# Patient Record
Sex: Female | Born: 1955 | Race: White | Hispanic: No | Marital: Married | Smoking: Never smoker
Health system: Southern US, Community
[De-identification: ages and names within clinical notes are randomized; demographics above are authoritative.]

## PROBLEM LIST (undated history)

## (undated) ENCOUNTER — Emergency Department (HOSPITAL_COMMUNITY): Discharge status: Against Medical Advice | Payer: BC Managed Care – PPO

## (undated) DIAGNOSIS — E785 Hyperlipidemia, unspecified: Secondary | ICD-10-CM

## (undated) DIAGNOSIS — T783XXA Angioneurotic edema, initial encounter: Secondary | ICD-10-CM

## (undated) DIAGNOSIS — T7840XA Allergy, unspecified, initial encounter: Secondary | ICD-10-CM

## (undated) DIAGNOSIS — F32A Depression, unspecified: Secondary | ICD-10-CM

## (undated) DIAGNOSIS — B019 Varicella without complication: Secondary | ICD-10-CM

## (undated) DIAGNOSIS — Z780 Asymptomatic menopausal state: Secondary | ICD-10-CM

## (undated) DIAGNOSIS — N3281 Overactive bladder: Secondary | ICD-10-CM

## (undated) DIAGNOSIS — R32 Unspecified urinary incontinence: Secondary | ICD-10-CM

## (undated) DIAGNOSIS — H269 Unspecified cataract: Secondary | ICD-10-CM

## (undated) DIAGNOSIS — H6120 Impacted cerumen, unspecified ear: Secondary | ICD-10-CM

## (undated) DIAGNOSIS — J45909 Unspecified asthma, uncomplicated: Secondary | ICD-10-CM

## (undated) DIAGNOSIS — G4700 Insomnia, unspecified: Secondary | ICD-10-CM

## (undated) DIAGNOSIS — M8580 Other specified disorders of bone density and structure, unspecified site: Secondary | ICD-10-CM

## (undated) DIAGNOSIS — M4854XA Collapsed vertebra, not elsewhere classified, thoracic region, initial encounter for fracture: Secondary | ICD-10-CM

## (undated) DIAGNOSIS — K219 Gastro-esophageal reflux disease without esophagitis: Secondary | ICD-10-CM

## (undated) DIAGNOSIS — M26609 Unspecified temporomandibular joint disorder, unspecified side: Secondary | ICD-10-CM

## (undated) DIAGNOSIS — F329 Major depressive disorder, single episode, unspecified: Secondary | ICD-10-CM

## (undated) HISTORY — DX: Gastro-esophageal reflux disease without esophagitis: K21.9

## (undated) HISTORY — DX: Unspecified temporomandibular joint disorder, unspecified side: M26.609

## (undated) HISTORY — DX: Varicella without complication: B01.9

## (undated) HISTORY — PX: COLONOSCOPY: SHX174

## (undated) HISTORY — DX: Unspecified asthma, uncomplicated: J45.909

## (undated) HISTORY — DX: Major depressive disorder, single episode, unspecified: F32.9

## (undated) HISTORY — DX: Overactive bladder: N32.81

## (undated) HISTORY — DX: Angioneurotic edema, initial encounter: T78.3XXA

## (undated) HISTORY — DX: Asymptomatic menopausal state: Z78.0

## (undated) HISTORY — DX: Allergy, unspecified, initial encounter: T78.40XA

## (undated) HISTORY — DX: Impacted cerumen, unspecified ear: H61.20

## (undated) HISTORY — DX: Depression, unspecified: F32.A

## (undated) HISTORY — DX: Unspecified cataract: H26.9

## (undated) HISTORY — DX: Insomnia, unspecified: G47.00

## (undated) HISTORY — DX: Hyperlipidemia, unspecified: E78.5

## (undated) HISTORY — DX: Unspecified urinary incontinence: R32

## (undated) HISTORY — DX: Collapsed vertebra, not elsewhere classified, thoracic region, initial encounter for fracture: M48.54XA

## (undated) HISTORY — DX: Other specified disorders of bone density and structure, unspecified site: M85.80

---

## 1976-03-28 HISTORY — PX: TUBAL LIGATION: SHX77

## 1998-12-09 ENCOUNTER — Ambulatory Visit (HOSPITAL_COMMUNITY): Admission: RE | Admit: 1998-12-09 | Discharge: 1998-12-09 | Payer: Self-pay | Admitting: *Deleted

## 1999-03-09 ENCOUNTER — Other Ambulatory Visit: Admission: RE | Admit: 1999-03-09 | Discharge: 1999-03-09 | Payer: Self-pay | Admitting: Obstetrics and Gynecology

## 2000-06-08 ENCOUNTER — Other Ambulatory Visit: Admission: RE | Admit: 2000-06-08 | Discharge: 2000-06-08 | Payer: Self-pay | Admitting: Obstetrics and Gynecology

## 2001-07-17 ENCOUNTER — Other Ambulatory Visit: Admission: RE | Admit: 2001-07-17 | Discharge: 2001-07-17 | Payer: Self-pay | Admitting: Obstetrics and Gynecology

## 2002-08-29 ENCOUNTER — Other Ambulatory Visit: Admission: RE | Admit: 2002-08-29 | Discharge: 2002-08-29 | Payer: Self-pay | Admitting: Obstetrics and Gynecology

## 2003-01-23 ENCOUNTER — Ambulatory Visit (HOSPITAL_BASED_OUTPATIENT_CLINIC_OR_DEPARTMENT_OTHER): Admission: RE | Admit: 2003-01-23 | Discharge: 2003-01-23 | Payer: Self-pay | Admitting: Orthopedic Surgery

## 2003-02-19 ENCOUNTER — Ambulatory Visit (HOSPITAL_BASED_OUTPATIENT_CLINIC_OR_DEPARTMENT_OTHER): Admission: RE | Admit: 2003-02-19 | Discharge: 2003-02-19 | Payer: Self-pay | Admitting: Orthopedic Surgery

## 2003-12-12 ENCOUNTER — Other Ambulatory Visit: Admission: RE | Admit: 2003-12-12 | Discharge: 2003-12-12 | Payer: Self-pay | Admitting: Obstetrics and Gynecology

## 2004-01-29 ENCOUNTER — Ambulatory Visit: Payer: Self-pay | Admitting: Internal Medicine

## 2005-01-24 ENCOUNTER — Other Ambulatory Visit: Admission: RE | Admit: 2005-01-24 | Discharge: 2005-01-24 | Payer: Self-pay | Admitting: Obstetrics and Gynecology

## 2005-08-05 ENCOUNTER — Ambulatory Visit: Payer: Self-pay | Admitting: Internal Medicine

## 2005-08-18 ENCOUNTER — Ambulatory Visit: Payer: Self-pay | Admitting: Internal Medicine

## 2005-11-29 ENCOUNTER — Encounter: Admission: RE | Admit: 2005-11-29 | Discharge: 2005-11-29 | Payer: Self-pay | Admitting: Family Medicine

## 2005-12-14 ENCOUNTER — Encounter: Admission: RE | Admit: 2005-12-14 | Discharge: 2005-12-14 | Payer: Self-pay | Admitting: Sports Medicine

## 2005-12-15 ENCOUNTER — Encounter: Admission: RE | Admit: 2005-12-15 | Discharge: 2005-12-15 | Payer: Self-pay | Admitting: Sports Medicine

## 2006-01-30 ENCOUNTER — Encounter: Admission: RE | Admit: 2006-01-30 | Discharge: 2006-01-30 | Payer: Self-pay | Admitting: Family Medicine

## 2006-05-11 ENCOUNTER — Ambulatory Visit: Payer: Self-pay | Admitting: Internal Medicine

## 2006-05-12 ENCOUNTER — Ambulatory Visit: Payer: Self-pay | Admitting: Cardiology

## 2006-06-27 ENCOUNTER — Ambulatory Visit: Payer: Self-pay | Admitting: Internal Medicine

## 2006-07-28 ENCOUNTER — Ambulatory Visit: Payer: Self-pay | Admitting: Internal Medicine

## 2006-09-08 ENCOUNTER — Ambulatory Visit: Payer: Self-pay | Admitting: Internal Medicine

## 2006-09-22 ENCOUNTER — Ambulatory Visit (HOSPITAL_COMMUNITY): Admission: RE | Admit: 2006-09-22 | Discharge: 2006-09-22 | Payer: Self-pay | Admitting: Internal Medicine

## 2006-09-28 ENCOUNTER — Ambulatory Visit: Payer: Self-pay | Admitting: Internal Medicine

## 2006-10-13 ENCOUNTER — Ambulatory Visit: Payer: Self-pay | Admitting: Internal Medicine

## 2006-11-29 ENCOUNTER — Ambulatory Visit: Payer: Self-pay | Admitting: Internal Medicine

## 2007-01-09 DIAGNOSIS — K219 Gastro-esophageal reflux disease without esophagitis: Secondary | ICD-10-CM | POA: Insufficient documentation

## 2007-01-09 DIAGNOSIS — J45909 Unspecified asthma, uncomplicated: Secondary | ICD-10-CM | POA: Insufficient documentation

## 2007-01-09 DIAGNOSIS — R059 Cough, unspecified: Secondary | ICD-10-CM | POA: Insufficient documentation

## 2007-01-09 DIAGNOSIS — J310 Chronic rhinitis: Secondary | ICD-10-CM | POA: Insufficient documentation

## 2007-01-09 DIAGNOSIS — R05 Cough: Secondary | ICD-10-CM

## 2007-01-09 DIAGNOSIS — R0602 Shortness of breath: Secondary | ICD-10-CM | POA: Insufficient documentation

## 2007-01-18 ENCOUNTER — Encounter: Admission: RE | Admit: 2007-01-18 | Discharge: 2007-01-18 | Payer: Self-pay | Admitting: Neurological Surgery

## 2007-02-20 ENCOUNTER — Telehealth (INDEPENDENT_AMBULATORY_CARE_PROVIDER_SITE_OTHER): Payer: Self-pay | Admitting: *Deleted

## 2007-02-26 ENCOUNTER — Telehealth: Payer: Self-pay | Admitting: Internal Medicine

## 2007-02-26 ENCOUNTER — Ambulatory Visit: Payer: Self-pay | Admitting: Internal Medicine

## 2007-03-14 ENCOUNTER — Ambulatory Visit: Payer: Self-pay | Admitting: Internal Medicine

## 2007-03-23 ENCOUNTER — Telehealth: Payer: Self-pay | Admitting: Adult Health

## 2007-03-27 ENCOUNTER — Ambulatory Visit: Payer: Self-pay | Admitting: Internal Medicine

## 2007-04-16 ENCOUNTER — Ambulatory Visit: Payer: Self-pay | Admitting: Internal Medicine

## 2007-06-06 ENCOUNTER — Ambulatory Visit: Payer: Self-pay | Admitting: Pulmonary Disease

## 2007-06-06 DIAGNOSIS — G4710 Hypersomnia, unspecified: Secondary | ICD-10-CM

## 2007-06-11 ENCOUNTER — Encounter: Payer: Self-pay | Admitting: Pulmonary Disease

## 2007-06-11 ENCOUNTER — Ambulatory Visit (HOSPITAL_BASED_OUTPATIENT_CLINIC_OR_DEPARTMENT_OTHER): Admission: RE | Admit: 2007-06-11 | Discharge: 2007-06-11 | Payer: Self-pay | Admitting: Pulmonary Disease

## 2007-06-12 ENCOUNTER — Ambulatory Visit: Payer: Self-pay | Admitting: Internal Medicine

## 2007-06-23 DIAGNOSIS — G4700 Insomnia, unspecified: Secondary | ICD-10-CM | POA: Insufficient documentation

## 2007-06-26 ENCOUNTER — Ambulatory Visit: Payer: Self-pay | Admitting: Pulmonary Disease

## 2007-07-04 ENCOUNTER — Ambulatory Visit: Payer: Self-pay | Admitting: Pulmonary Disease

## 2007-07-07 ENCOUNTER — Emergency Department (HOSPITAL_COMMUNITY): Admission: EM | Admit: 2007-07-07 | Discharge: 2007-07-07 | Payer: Self-pay | Admitting: Emergency Medicine

## 2007-07-18 ENCOUNTER — Telehealth (INDEPENDENT_AMBULATORY_CARE_PROVIDER_SITE_OTHER): Payer: Self-pay | Admitting: *Deleted

## 2007-08-27 ENCOUNTER — Telehealth (INDEPENDENT_AMBULATORY_CARE_PROVIDER_SITE_OTHER): Payer: Self-pay | Admitting: *Deleted

## 2007-09-12 ENCOUNTER — Ambulatory Visit: Payer: Self-pay | Admitting: Internal Medicine

## 2008-11-20 ENCOUNTER — Ambulatory Visit: Payer: Self-pay | Admitting: Internal Medicine

## 2009-02-03 ENCOUNTER — Telehealth: Payer: Self-pay | Admitting: Internal Medicine

## 2009-03-28 HISTORY — PX: BLADDER SUSPENSION: SHX72

## 2010-03-23 ENCOUNTER — Ambulatory Visit
Admission: RE | Admit: 2010-03-23 | Discharge: 2010-03-23 | Payer: Self-pay | Source: Home / Self Care | Attending: Urology | Admitting: Urology

## 2010-06-07 LAB — CBC
MCHC: 33 g/dL (ref 30.0–36.0)
RBC: 3.93 MIL/uL (ref 3.87–5.11)
RDW: 13.2 % (ref 11.5–15.5)

## 2010-06-07 LAB — PROTIME-INR
INR: 1.11 (ref 0.00–1.49)
Prothrombin Time: 14.5 seconds (ref 11.6–15.2)

## 2010-06-07 LAB — TYPE AND SCREEN
ABO/RH(D): A POS
Antibody Screen: NEGATIVE

## 2010-08-10 NOTE — Procedures (Signed)
**Note Robin-Identified via Obfuscation** [name], Robin Banks               ACCOUNT NO.:  [id number]   MEDICAL RECORD NO.:  [id number]          PATIENT TYPE:  OUT   LOCATION:  SLEEP CENTER                 FACILITY:  [facility]   PHYSICIAN:  Barbaraann Share, MD,FCCPDATE OF BIRTH:  11/01/1953   DATE OF STUDY:  06/11/2007                            NOCTURNAL POLYSOMNOGRAM   REFERRING PHYSICIAN:  Barbaraann Share, MD,FCCP   INDICATION FOR STUDY:  Unspecified sleep disturbance.  780.50.   EPWORTH SLEEPINESS SCORE:  15   MEDICATIONS:   SLEEP ARCHITECTURE:  The patient had a total sleep time of 359 minutes  with adequate slow wave sleep but significantly decreased REM.  Sleep  onset latency was normal and REM onset was quite prolonged at 196  minutes.  Sleep efficiency was fairly good at 92%.   RESPIRATORY DATA:  The patient was found to have four central apnea's  and three obstructive hypopnea's for an apnea/hypopnea index of 1.2  events per hour.  The events occurred primarily in the supine position  and there was mild snoring at best noted throughout.   OXYGEN DATA:  There was oxygen desaturation transiently to 91%.   CARDIAC DATA:  No clinically significant arrhythmia's were noted.   MOVEMENT-PARASOMNIA:  The patient was found to have 126 periodic leg  movements of sleep with 2 per hour resulting in arousal or awakening.  She also had large numbers of leg jerks that did not meet the criteria  for PLMS but appeared to be associated with spontaneous arousal.  There  were no abnormal behaviors noted.   IMPRESSIONS-RECOMMENDATIONS:  1. Small numbers of apnea's and hypopnea's which do not meet the      apnea/hypopnea index criteria for the obstructive or central sleep      apnea syndrome.  2. Large numbers of periodic leg movements with mild sleep disruption      noted.  There were also large numbers of      isolated leg jerks that appear to result in arousals.  Clinical      correlation is suggested to consider the  possibility of a primary      movement disorder of sleep.      Barbaraann Share, MD,FCCP  Diplomate, American Board of Sleep  Medicine  Electronically Signed     [facility]/MEDQ  D:  06/26/2007 09:02:48  T:  06/26/2007 09:16:09  Job:  [id number]

## 2010-08-10 NOTE — Assessment & Plan Note (Signed)
[facility]                             PULMONARY OFFICE NOTE   Robin Banks, Robin                      MRN:          [id number]  DATE:02/26/2007                            DOB:          1955-11-12    HISTORY:  This is a 55 year old white female, never smoker, who presents  for followup evaluation of chronic cough.  She tells me that she had  complete control of the cough for several months ending in the first of  November while she was being treated aggressively for reflux with a  combination of Protonix q.a.m., Zegerid nightly and Reglan 10 mg before  meals and at bedtime.  She did not disclose that she started fish oil  (she thinks in the Spring) and does carry a diagnosis of positive  methacholine challenge test documented in June which contributed to  decreased airflow but no cough.   My last notes suggested that if we treated reflux and rhinitis  aggressively and the symptoms went away and the symptoms did not recur,  then we could begin to taper her medicines.  However, she did not return  for followup (she admits she has been under quite a bit of stress  emotionally and is working through this with Dr. Roselie Skinner help).   She comes back now because her cough has recurred beginning around the  first of November and it consists of a nighttime cough with minimal  sputum production that is disturbing her sleep.  She is also having  morning paroxysms of the cough that seems better during the day with no  overt reflux symptoms but mild nasal drainage.   Chart review indicates the patient was to maintain on nasal steroids but  failed to do so despite having written instructions to the contrary that  were reviewed in writing (does not remember what she did with the  instructions after she left here).   PHYSICAL EXAMINATION:  She is a pleasant, ambulatory white female in no  acute distress.  She is afebrile, normal vital signs.  HEENT:   Unremarkable, oropharynx clear.  There is no evidence of  postnasal drainage or cobblestoning.  NECK:  Supple without cervical adenopathy or tenderness, trachea is  midline, no thyromegaly.  LUNGS:  Fields perfectly clear bilaterally to auscultation and  percussion.  Regular rhythm without murmur, gallop, rub.  ABDOMEN:  Soft, benign.  EXTREMITIES:  Warm without calf tenderness, cyanosis, clubbing, edema.   IMPRESSION:  1. Recurrent cough of undetermined etiology.  This patient has      documented positive methacholine challenge test, but did so well      with treatment directed at reflux that I suspect reflux is playing      a major role in her airways instability.  2. Complex medical regimen.  I did not realize the patient was still      on progesterone (which can cause reflux) and fish oil (which can      cause non-acid injury to the upper and lower airway).  I am going      to ask her to stop  both and continue to treat her aggressively for      reflux while treating her acutely for sinus drainage by putting      her back on nasal steroids in the form of Nasacort and a 6-day      course of prednisone to eliminate any residual upper or lower      inflammation that may be present.   It remains to be seen whether aggressive treatment directed at rhinitis  and reflux in the absence of any medications that contributed to reflux  will completely eliminate the cough or whether she needs to be back on  inhaled steroids in the form of Qvar longterm since she does have a  positive methacholine challenge test (albeit with no symptoms  attributable to asthma during the test or subsequently after stopping  Qvar which occurred back in early September).  Followup will be in 2  weeks for medication reconciliation purposes.  I will see her subsequent  to that, sooner if she is still coughing, or later to help her taper her  off of some of her maintenance regimen depending on whether or not she   is able to successfully stop progestational agents which have been  linked to reflux, and whether or not that is any help.     Charlaine Dalton. Sherene Sires, MD, [facility]  Electronically Signed    MBW/MedQ  DD: 02/26/2007  DT: 02/26/2007  Job #: [id number]   cc:   Donia Guiles, M.D.

## 2010-08-10 NOTE — Assessment & Plan Note (Signed)
[facility]                             PULMONARY OFFICE NOTE   CAELEY, DOHRMANN                      MRN:          [id number]  DATE:09/08/2006                            DOB:          19-Mar-1956    HISTORY:  A 55 year old white female who initiated Singulair in April  for possible exercise-induced asthma.  Now presenting with increasing  symptoms of cough and dyspnea and need for albuterol.  She was given a  short course of prednisone, and stated even on this she only got 50%  improvement in terms of her symptoms.  She has more day than nighttime  symptoms, but does get relief immediately with albuterol.  She denies  any purulent sputum, sinus complaints, overt reflux, fevers, chills,  sweats, orthopnea, PND, or leg swelling.   For medication inventory, please see patient sheet column dated September 08, 2006, noting this patient did not follow the instructions previously  regarding the use of PPI, because she says I don't have reflux.   Presently, she is coughing so hard she notices frequent urinary  incontinence.   PHYSICAL EXAMINATION:  She is a somber, but not overtly depressed,  ambulatory white female in no acute distress.  She is afebrile with normal vital signs.  HEENT:  Unremarkable.  Oropharynx clear.  Lung fields are perfectly clear bilaterally to auscultation and  percussion with no cough elicited on inspiratory/expiratory maneuvers.  There is a regular rate and rhythm without murmurs, gallops, or rubs.  ABDOMEN:  Soft, benign.  EXTREMITIES:  Warm without calf tenderness, cyanosis, clubbing, or  edema.   PFTs were reviewed from 2000 and are normal.   IMPRESSION:  Chronic cough and dyspnea of undetermined etiology, but  only improved 50% on prednisone.  It may well be she has a combination  of asthma and reflux, and which came first is difficult to tell.  The  fact that she coughs so hard she has urinary incontinence suggests to  me  she probably also has gastroesophageal valve insufficiency caused by  wide swings in intra-abd pressure, and I tried to explain this in as  much detail as possible.   Rather than empirically treat her again on a broad spectrum pattern, I  am going to maximally treat her for acid reflux, instead, with Prilosec  20 mg b.i.d. before meals, Zantac 75 mg tablets 2 nightly, and continue  Singulair for now (reasoning that it certainly could not hurt, but is  not aggravating the upper airway like an inhaled steroid might), and  have her come back in 2 weeks for a methacholine challenge test.   I note parenthetically that she is on 400 mg of progesterone at bedtime,  and that this, just like pregnancy, tends to worsen reflux.  She says  that that is not likely the culprit, though, because  she started the symptoms long before she started the progesterone.  However, if the cough becomes truly refractory, adjust this medication  may become necessary.     Charlaine Dalton. Sherene Sires, MD, [facility]  Electronically Signed    MBW/MedQ  DD:  09/08/2006  DT: 09/09/2006  Job #: [id number]   cc:   Donia Guiles, M.D.

## 2010-08-10 NOTE — Assessment & Plan Note (Signed)
[facility]                             PULMONARY OFFICE NOTE   KHIANA, CAMINO                      MRN:          [id number]  DATE:09/28/2006                            DOB:          [birth year]    PULMONARY EXTENDED SUMMARY FOLLOW-UP VISIT:   HISTORY:  A 55 year old white female, never smoker, with a recurrent  cough, who previously was felt to have possible exercise-induced asthma  and chronic rhinitis.  This particular cough began in January with a  cold and despite transient improvement from treatment directed at  asthma, rhinitis, and reflux, she has never been 100% back to baseline.  In fact, she has only been improved about 50% of her baseline and  therefore underwent a methacholine challenge test while taking both  Singulair and both Reglan as well as Zegerid.  This reproduced her  dyspnea but not her cough on the study done September 22, 2006.  She  subsequently stopped all of her medications and comes back today no  worse in terms of cough but having increasing throat pain throat  burning, and chest discomfort.  She denies any pleuritic or exertional  component of the pain, orthopnea, PND, or overt sinus symptoms.   Presently, she complains of coughing 24 hours a day but much worse  after eating supper.  The total amount of mucus she coughs up during a  real fit is less than a half teaspoon and is not discolored.   PHYSICAL EXAMINATION:  GENERAL:  On physical examination, she is an  anxious white female who appears a bit depressed and angry at times.  VITAL SIGNS:  She is afebrile with normal vital signs.  HEENT:  Remarkably unremarkable.  Oropharynx is clear, and yet she  clears her throat frequently in her exam.  Nasal turbinates are normal.  Ear canals are clear bilaterally.  No cough or reflux elicited.  NECK:  Supple without cervical adenopathy or tenderness.  HEART:  There is a regular rate and rhythm without murmur, rub or gallop  present.  ABDOMEN:  Soft, benign.  EXTREMITIES:  Warm without calf tenderness, clubbing, cyanosis or edema.   The methacholine challenge test was reviewed with the patient from June  27 and is indeed positive at the highest strength but again, did not  elicit cough.   IMPRESSION:  1. Positive methacholine challenge test to indicate the patient has      asthma, which we suspected right along, but did not respond to      either Singulair or treatment directed at reflux.  I therefore      assumed she has primary air disease and recommended QVAR 40 two      puffs b.i.d.  2. Her throat and chest-burning strongly suggests reflux that is      induced by the use of progesterational agents for the purpose of      insomnia.  I have advised her that there are other ways to treat      insomnia, at which time she became a bit angry and upset, regarding  the possibility that she may have to stop the progestational      agents, but we worked through this.  For now, I recommended      reinstituting aggressive reflux treatment with a combination of      Protonix and Zegerid plus Reglan and gave her written instructions      on this, plus diet.   If she does have breakthrough wheezing or symptoms of dyspnea, she can  use ProAir 2 puffs q.4h. p.r.n.  I spent extra time coaching her on  optimal MDI technique, which she mastered about 75% effectiveness.   The next step, if she remains refractory in terms of her symptoms of  cough, would be to add a first-generation antihistamine to her regimen.   Since prednisone worked effectively in the past, albeit only at a 50%  level, I also recommended a six day course of prednisone to gain acute  control of her airway inflammation and let the QVAR take effect over the  next week.  1. Followup will be every two weeks to assess response to therapy      using the 14-step algorithm designed by Dr. Tomasita Morrow at the      [facility], which I  reviewed with the patient as      well.   At the patient's request, no copies were sent to any of her physicians.     Charlaine Dalton. Sherene Sires, MD, [facility]  Electronically Signed    MBW/MedQ  DD: 09/28/2006  DT: 09/29/2006  Job #: [id number]

## 2010-08-10 NOTE — Assessment & Plan Note (Signed)
[facility]                             PULMONARY OFFICE NOTE   TAQUITA, DEMBY                      MRN:          [id number]  DATE:10/13/2006                            DOB:          12/10/1955    HISTORY:  A 55 year old white female with cough and dyspnea dating back  over a year now, 100% improved following a short course of prednisone  and maintenance therapy with a combination of Qvar 2 puffs b.i.d.,  Protonix 40 mg q.a.m., Zegerid 40 mg nightly, and Reglan 10 mg before  meals and at bedtime.  On this regimen she is exercising full time  without difficulty and having no problem with cough or dyspnea.   PHYSICAL EXAMINATION:  She is a pleasant, ambulatory white female in no  acute distress with a much more positive outlook than on previous  evaluations.  She is afebrile, normal vital signs.  HEENT:  Remarkably unremarkable, oropharynx clear.  There is no evidence  of postnasal drainage or cobblestoning.  NECK:  Supple without cervical adenopathy, tenderness, trachea is  midline, no thyromegaly.  LUNGS:  Fields perfectly clear bilaterally to auscultation and  percussion.  Regular rhythm without murmur, gallop, rub.  ABDOMEN:  Soft, benign.  EXTREMITIES:  Warm without calf tenderness, cyanosis, clubbing, edema.   Hemoglobin saturation 96% on room air.   IMPRESSION:  Complete resolution of cough and atypical asthma symptoms  on a regimen directed at both airways inflammation and reflux.  I am  concerned about reflux because the patient is on progestational agents  but would at this point recommend she stay on the same regimen for 6  weeks to have full confidence that reflux and primary airway disease  both coexist in this patient and at that point, 6 weeks from now, try to  taper off the reflux medicines to see if the cough exacerbates.  If so I  am going to ask her to see Dr. Lina Sar, her gastroenterology  physician of record.   Obviously if the cough exacerbates despite  aggressive treatment directed at both asthma and reflux in the meantime  I will need to see her back here to regroup.     Charlaine Dalton. Sherene Sires, MD, [facility]  Electronically Signed    MBW/MedQ  DD: 10/13/2006  DT: 10/14/2006  Job #: [id number]   cc:   Donia Guiles, M.D.

## 2010-08-10 NOTE — Assessment & Plan Note (Signed)
[facility]                             PULMONARY OFFICE NOTE   TOPANGA, ALVELO                      MRN:          [id number]  DATE:11/29/2006                            DOB:          04/24/1955    PULMONARY EXTENDED FOLLOWUP OFFICE VISIT   HISTORY:  A 55 year old white female with cough and dyspnea dating back  to April of 2007 returns all smiles with no significant cough despite  having recent increase in nasal congestion like a head cold that seems  to be doing better without the need for any increase in medications  other than over-the-counter cold medicines.  Specifically, she has not  had increasing cough and dyspnea.  On her own, she reduced Qvar down to  2 puffs q. a.m. because she is not convinced she is asthmatic.  (Note  that she had previously had a positive methacholine challenge test on  September 22, 2006, although this did not precipitate coughing.)   For full inventory of medications, please see face sheet dated November 29, 2006 noting she is being treated for both the acid and non-acid  components of reflux presently.   PHYSICAL EXAMINATION:  She is a quite healthy-appearing ambulatory white  female in no acute distress.  VITAL SIGNS:  Stable vital signs.  HEENT:  Reveals moderate turbinate edema with no significant sinuses,  pallor, or polyps.  Oropharynx is clear.  Dentition intact.  NECK:  Supple without cervical adenopathy or tenderness. Trachea is  midline.  No thyromegaly.  LUNGS:  The lung fields are perfectly clear bilaterally to auscultation  and percussion.  No cough on inspiratory or expiratory maneuvers.  CARDIAC:  Regular rate and rhythm without murmur, gallop, or rub.  ABDOMEN:  Soft and benign.  EXTREMITIES:  Warm without calf tenderness, cyanosis, clubbing, or  edema.   IMPRESSION:  1. This patient has asthma, although it could well be that if we treat      rhinitis and reflux aggressively, she would not  need anything for      asthma.  She is not inclined to continue Qvar indefinitely based on      my conversation with her today having already tapered to 2 puffs q.      a.m., so I am fine with her stopping it now to see if she flares.  2. She should continue high dose reflux treatment for now and see if      the cough exacerbates off of Qvar before tapering the reflux      medicine.  If the cough then exacerbates off reflux medicines, I      think she should see Dr. Lina Sar, her GI physician of record.  3. She does have evidence clinically of rhinitis, but no evidence of      sinusitis by most recent sinus CT scan dated May 12, 2006.  I,      therefore, spent extra time going over a chronic rhinitis flyer      with her, suggesting the use of over-the-counter fluticasone for      the purpose of  controlling nasal inflammation and having failed      Singulair previously.  She received written formatted material,      which I have asked her to review, explaining how to treat rhinitis      both chronically and acutely using Afrin for 5 days to help      fluticasone to reach the target tissue.   Followup will be in 6 weeks, sooner if needed.     Charlaine Dalton. Sherene Sires, MD, [facility]  Electronically Signed    MBW/MedQ  DD: 11/29/2006  DT: 11/29/2006  Job #: [id number]   cc:   Donia Guiles, M.D.

## 2010-08-13 NOTE — Assessment & Plan Note (Signed)
[facility]                             PULMONARY OFFICE NOTE   ELINDA, BUNTEN                      MRN:          [id number]  DATE:07/28/2006                            DOB:          02/01/1956    HISTORY:  A 55 year old, white female started on Singulair on April 1  for possible exercise-induced asthma who found marked improvement in  need for albuterol until she caught a cold several weeks ago and now  comes in with a hacking cough productive of green yellow sputum  associated with dyspnea and increased need for albuterol over baseline  to the point where she is using it about twice a day. In fact she last  used it within the last 4 hours before coming to the office. She denies  any pleuritic pain, fever, sinus or overt reflux symptoms.   PHYSICAL EXAMINATION:  GENERAL:  She is a pleasant, ambulatory, white  female in no acute distress.  VITAL SIGNS:  She had stable vital signs.  HEENT:  Unremarkable. Oropharynx reveals minimal erythema. Nasal  turbinates reveal moderate edema. Ear canals clear bilaterally.  NECK:  Supple without cervical adenopathy or tenderness. The trachea is  midline, no thyromegaly.  LUNGS:  Lung fields completely clear bilaterally to auscultation and  percussion.  HEART:  There is a regular rate and rhythm without murmur, gallop or  rub.  ABDOMEN:  Soft and benign.  EXTREMITIES:  Warm without calf tenderness, cyanosis, clubbing or edema.   Chart review indicates she had a sinus CT scan on May 12, 2006 that  was normal.   IMPRESSION:  Question acute asthma in the setting of tracheobronchitis  with purulent sputum. She may even have an underlying sinusitis.  However, at this point the objective evidence of the disease is  disproportionate to her symptoms, so I am going to recommend  conservative treatment with doxicycline 100 mg b.i.d. for 7 days,  prednisone 10 mg tablets #14 to be tapered over 6 days, and ask  her to  start back on Prilosec, since she previously responded so well in terms  of some of her atypical symptoms to PPI therapy. In fact, I told her at  the onset of  cough for any reason she should start on Prilosec twice  daily until better and then taper it back off.   At this point, it is too soon to call Singulair a failure but I reminded  her about the rule of twos and that she should use the Singulair on a  daily basis and if she is having frequent exacerbations of any cause  then we need to switch over to inhaled steroids but perhaps first would  benefit from a methacholine challenge test to make absolutely sure that  this is true asthma (the improvement on Singulair is certainly  suggestive as is her response to Albuterol).    Charlaine Dalton. Sherene Sires, MD, [facility]  Electronically Signed   MBW/MedQ  DD: 07/29/2006  DT: 07/29/2006  Job #: [id number]   cc:   Donia Guiles, M.D.

## 2010-08-13 NOTE — Op Note (Signed)
NAME:  Robin Banks, Robin Banks                         ACCOUNT NO.:  [id number]   MEDICAL RECORD NO.:  [id number]                   PATIENT TYPE:  AMB   LOCATION:  DSC                                  FACILITY:  [facility]   PHYSICIAN:  Cindee Salt, M.D.                    DATE OF BIRTH:  October 15, 1955   DATE OF PROCEDURE:  02/19/2003  DATE OF DISCHARGE:                                 OPERATIVE REPORT   PREOPERATIVE DIAGNOSIS:  Carpal tunnel syndrome, left hand.   POSTOPERATIVE DIAGNOSIS:  Carpal tunnel syndrome, left hand.   OPERATION:  Decompression of left median nerve.   SURGEON:  Cindee Salt, M.D.   ASSISTANTCarolyne Fiscal.   ANESTHESIA:  Forearm-based IV regional.   HISTORY:  The patient is a 55 year old female with a history of carpal  tunnel syndrome; EMG nerve conduction is positive.  This has not responded  to conservative treatment.   DESCRIPTION OF PROCEDURE:  The patient is brought to the operating room,  where a forearm-based IV regional anesthetic was carried out without  difficulty.  She was prepped using Duraprep in the supine position, with the  left arm free.  A longitudinal incision was made at the palm and carried  down through subcutaneous tissue.  Bleeders were electrocauterized.  Palmar  fascia was split.  Superficial palmar arch identified.  The flexor tendon to  the ring and middle finger identified to the ulnar side of the median nerve.  Carpal retinaculum was incised with sharp dissection, right angle and Sewell  retractor placed between skin and forearm fascia.  The fascia was released  for approximately 3 cm, proximal to the wrist crease under direct vision.  The canal was explored.  No further lesions were identified.  The wound was  irrigated.  The skin was closed with interrupted 5-0 nylon sutures.  A  sterile compressive dressing and splint was applied.  The patient tolerated  the procedure well and was taken to the recovery room for observation, in  satisfactory  condition.   She is discharged to home, to return to [facility] [place] in one  week.  Given Talwin, Tylox and Keflex.                                               Cindee Salt, M.D.    GK/MEDQ  D:  02/19/2003  T:  02/20/2003  Job:  [id number]

## 2010-08-13 NOTE — Assessment & Plan Note (Signed)
[facility]                             PULMONARY OFFICE NOTE   MORGANNE, Robin Banks                      MRN:          [id number]  DATE:05/11/2006                            DOB:          09/03/1955    REASON FOR CONSULTATION:  Cough/wheeze.   HISTORY:  A 55 year old white female, never smoker who was diagnosed  with allergies at age 57 consisting of headache and sinus drainage for  which she underwent chemical cautery and was then maintained on Allegra  for a while. She found out on her own that on or off the Allegra made  no difference and that the symptoms of mild nasal congestion were not  worth taking the medicine for. However, since that she was seen by Dr.  Marcos Eke with diagnosis of asthma versus GERD in 2001 and again placed  on medications that seemed to help but when she stopped them it seemed  to not make any difference. This consisted of Pulmicort, Serevent, and  medicine directed a reflux. She comes in now with new onset dyspnea on  exertion that started in April of 2007, seemed better initially with  albuterol but now since early January it has been having a hacking cough  during the day, productive of only minum sputum, no nocturnal symptoms  or dyspnea with exertion but is better on albuterol but only for a short  period of time and then relapses. She denies any exertional chest pain,  orthopnea, PND, overt sinus or reflux symptoms.   PAST MEDICAL HISTORY:  Significant only for tubal ligation and a  diagnosis of acid reflux disease. Diverticulosis by colonoscopy Aug 18, 2005. Under reflux disease I could not find any studies at [facility] on the issue of reflux.   ALLERGIES:  None known.   SOCIAL HISTORY:  She has never smoked. She works as a Forensic scientist.   MEDICATIONS:  Include; multivitamins, testosterone, progesterone, and  estrogens.   FAMILY HISTORY:  Recorded in detail, negative for respiratory  diseases  or atopy.   REVIEW OF SYSTEMS:  Also taken in detail and negative except as outlined  in the present illness.   PHYSICAL EXAMINATION:  This is a pleasant ambulatory white female in no  acute distress, she is afebrile, stable vital signs.  HEENT: Reveals moderate nonspecific turbinate edema, oropharynx  is  clear with no excessive postnasal drainage or cobblestoning, dentition  is intact, ear canals clear bilaterally.  NECK: Supple without cervical adenopathy, or tenderness. Trachea is  midline.  LUNG FIELDS: Perfectly clear bilaterally to auscultation and percussion  with no cough listened on inspiratory or expiratory maneuvers.  There is a regular rate and rhythm, without murmur, gallop, or rub.  ABDOMEN: Soft, benign.  EXTREMITIES: Without calf tenderness, cyanosis, clubbing, or edema.   Chest x-ray is reviewed from January 30, 2006 is normal.   IMPRESSION:  I suspect that this patient has chronic rhinitis with very  mild asthma. The challenge is figuring which came first. In the past she  seemed to respond to medicines that were combined together and which  left unanswered the question of what started the problem and then when  she stopped the medications did not get any immediate flare up to  indicate which of the 2 lines of therapy (either the medicines for  reflux or asthma) were the more important. Since it is much more likely  that reflux causes asthma then the asthma is causing reflux, I am going  to use the approach of treating the reflux first, although I did give  her a 6 day course of predinsone to control any acute airways  inflammation that may be present either from reflux or involving the  primary airways.   Also because the history of chronic sinus disease entered the picture at  age 1 and has been pretty much present ever since it started it may be  that she has poorly controlled rhinitis/ sinusitis as well therefore I  am going to recommend a sinus  CT scan be done to complete the workup.   I gave her information on how to manage reflux by diet and recommended a  trial of Protonix 40 mg q. a.m. for the next month before returning here  for follow up. For excess cough I would like her to use Delsym  supplemented with tramadol, for now use the albuterol only if needed and  judge the response to the above regimen by the degree to which she can  eliminate albuterol from her list of p.r.n. s.     Casimiro Needle B. Sherene Sires, MD, [facility]  Electronically Signed    MBW/MedQ  DD: 05/11/2006  DT: 05/11/2006  Job #: [id number]   cc:   Donia Guiles, M.D.

## 2010-08-13 NOTE — Op Note (Signed)
   NAME:  Robin Banks, Robin Banks                         ACCOUNT NO.:  [id number]   MEDICAL RECORD NO.:  [id number]                   PATIENT TYPE:  AMB   LOCATION:  DSC                                  FACILITY:  [facility]   PHYSICIAN:  Cindee Salt, M.D.                    DATE OF BIRTH:  [birth year]   DATE OF PROCEDURE:  01/23/2003  DATE OF DISCHARGE:                                 OPERATIVE REPORT   PREOPERATIVE DIAGNOSIS:  Carpal tunnel syndrome, right hand.   POSTOPERATIVE DIAGNOSIS:  Carpal tunnel syndrome, right hand.   OPERATION PERFORMED:  Decompression, right median nerve.   SURGEON:  Cindee Salt, M.D.   ASSISTANT:  None.   ANESTHESIA:  Forearm based IV regional.   INDICATIONS FOR PROCEDURE:  The patient is a 55 year old female with a  history of carpal tunnel syndrome, EMG nerve conduction studies positive,  which has not responded to conservative treatment.   DESCRIPTION OF PROCEDURE:  The patient was brought to the operating room  where a forearm based IV regional anesthetic was carried out without  difficulty.  She was prepped using DuraPrep in supine position, right arm  free.  A longitudinal incision was made in the palm and carried down through  subcutaneous tissue.  Bleeders were electrocauterized.  The palmar fascia  was split.  The superficial palmar arch identified.  The flexor tendon to  the ring and little finger identified to the ulnar side of the median nerve.  The carpal retinaculum was incised with sharp dissection.  A right angle and  Sewell retractor were placed between the skin and forearm fascia.  The  fascia was released for approximately 3 cm proximal to the wrist crease  under direct vision.  The canal was explored.  Tenosynovial tissue was  thickened.  The nerve was noted to be compressed proximally.  No further  lesions were identified.  The wound was irrigated.  The skin was closed with  interrupted 5-0 nylon sutures.  The forearm fascia was released  after  placement of a Sewell retractor and right angle retractor under direct  vision. A sterile compressive dressing and splint was applied after closure  of the wound with 5-0 nylon.  The patient tolerated the procedure well and  was taken to the recovery room for observation in satisfactory condition.  She discharged to home to return to the [facility] [place] in one  week on Vicodin and Keflex.                                                Cindee Salt, M.D.    Angelique Blonder  D:  01/23/2003  T:  01/23/2003  Job:  [id number]

## 2010-08-13 NOTE — Assessment & Plan Note (Signed)
Robin Banks                             PULMONARY OFFICE NOTE   Robin Banks, Robin Banks                      MRN:          [id number]  DATE:06/27/2006                            DOB:          [birth year]    HISTORY OF PRESENT ILLNESS:  This is a 55 year old white female with  history of chronic rhinitis and questionable asthma with dyspnea on  exertion associated with cough beginning April 2007, which has almost  completely resolved having treated her with a short course of prednisone  (6 days) and four weeks of Protonix.  Since the Protonix was stopped,  she has not noticed any difference in her symptoms which consist now of  only wheezing and shortness of breath with exercise.  However, she  notices this every single time she exercises and basically using  albuterol daily.   MEDICATIONS:  For full inventory of medications, please see column dated  06/27/2006.   PHYSICAL EXAMINATION:  GENERAL:  She is a pleasant, ambulatory white  female in no acute distress.  VITAL SIGNS:  Stable.  HEENT:  Mild to moderate turbinate edema, nonspecific in nature with no  polyps or cyanosis.  Ear canals are good bilateral.  Oropharynx was  clear with no excessive post nasal drainage.  LUNGS:  Lung fields perfectly clear bilaterally.  Clear to auscultation  and percussion.  HEART:  Regular rhythm without murmurs, gallops, rubs.  ABDOMEN:  Soft, benign.  EXTREMITIES:  Warm without calf tenderness, cyanosis, clubbing or edema.   IMPRESSION:  Extended discussion with this patient regarding her  diagnosis and management options lasting 15-20 minutes, 25 minute visit  today.   Given the patient's background history of rhinitis and questionable  asthma, I suspect she does indeed have exercise induced asthma and  probably is more of a chronic asthmatic than she realizes.  She probably  qualifies for chronic inhaled steroids, but when I gave her the option,  she elected  a trial of Singulair first.  Although, Singulair is not  universally effective, when it is effective, it has the potential to  100% address both her seasonal rhinitis issues and the exercise induced  dyspnea and wheezing that she describes.     I also told her the long term approach with Albuterol is to treat  symptoms on a p.r.n. basis, but she should not be using the Albuterol  more than twice a week.  The exception to this rule is the patient who  has pure exercise induced asthma.  This, in fact, may be the first  patient I have seen in years who has asthma isolated to exercise,  although, her recent but dramatic response to prednisone would suggest  to me there is a chronic asthmatic component not just a cyclical upper  airway cough as I initially suspected.  Therefore, I told her Albuterol  would only treat the symptoms and not the underlying disease.  It is not  a great option for long term treatment, but certainly can be use don a  p.r.n. basis until we find a medication that eradicates her daily  symptoms of dyspnea with exercise and reduces her exacerbations with  coughing requiring short courses of steroids.  The most likely choice  for ICS in this setting is qvar, since it has the  best track record in terms of not aggravating cough. Since she prefers a  pill,  we will try Singulair first, and she was given a four week sample  with the prescription to refill in a year if she is happy with it.  Otherwise, we will see her back here to start qvar.     Charlaine Dalton. Sherene Sires, MD, [facility]  Electronically Signed    MBW/MedQ  DD: 06/28/2006  DT: 06/28/2006  Job #: [id number]

## 2012-03-01 ENCOUNTER — Other Ambulatory Visit: Payer: Self-pay | Admitting: Obstetrics and Gynecology

## 2012-03-01 DIAGNOSIS — R928 Other abnormal and inconclusive findings on diagnostic imaging of breast: Secondary | ICD-10-CM

## 2012-03-14 ENCOUNTER — Other Ambulatory Visit: Payer: Self-pay

## 2012-03-15 ENCOUNTER — Ambulatory Visit
Admission: RE | Admit: 2012-03-15 | Discharge: 2012-03-15 | Disposition: A | Discharge status: Home / Self Care | Payer: BC Managed Care – PPO | Source: Ambulatory Visit | Attending: Obstetrics and Gynecology | Admitting: Obstetrics and Gynecology

## 2012-03-15 DIAGNOSIS — R928 Other abnormal and inconclusive findings on diagnostic imaging of breast: Secondary | ICD-10-CM

## 2012-03-28 HISTORY — PX: CARPAL TUNNEL RELEASE: SHX101

## 2012-10-01 ENCOUNTER — Other Ambulatory Visit: Payer: Self-pay | Admitting: Geriatric Medicine

## 2013-06-28 IMAGING — MG MM DIGITAL DIAGNOSTIC UNILAT*R*
2 series · 2 of 2 positions shown · non-contrast
Comparison: Mammography 02/21/2012, 10/25/2010, dating back to
05/14/2007.  No prior ultrasound.

CLINICAL DATA: Called back from screening mammography, possible
right breast mass.

DIGITAL DIAGNOSTIC RIGHT MAMMOGRAM  AND RIGHT BREAST ULTRASOUND:

[R CC]
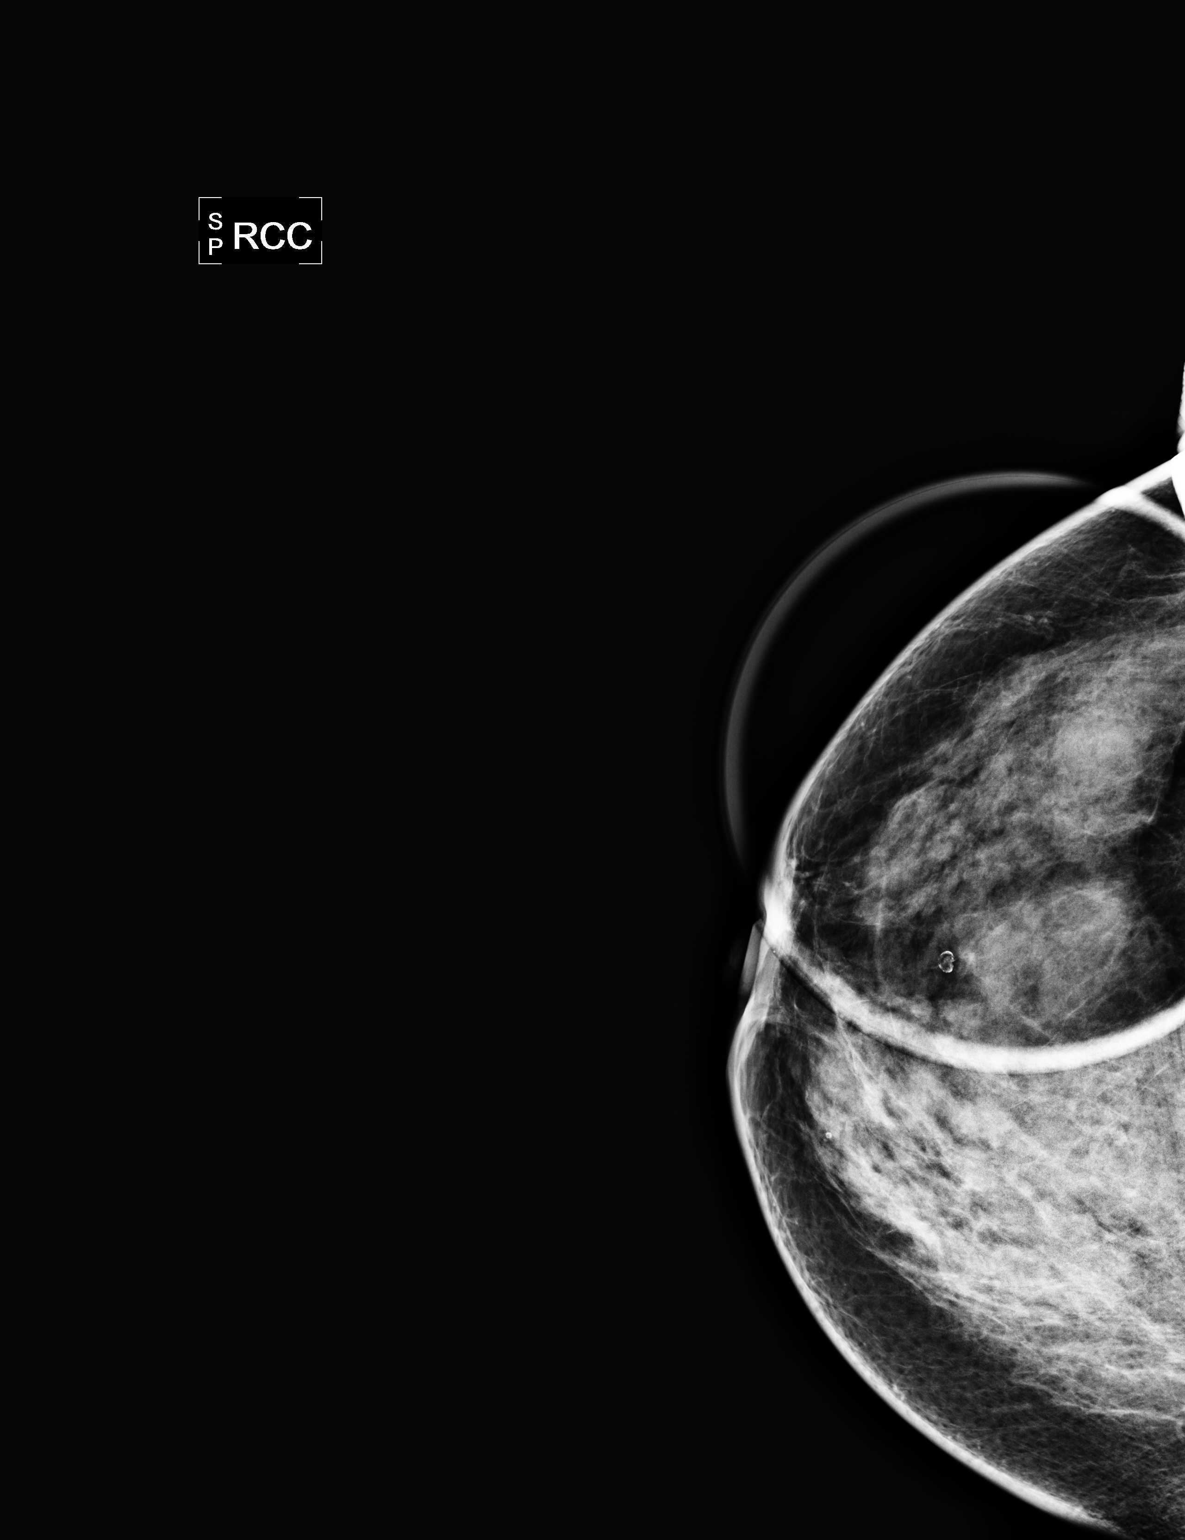

[R MLO]
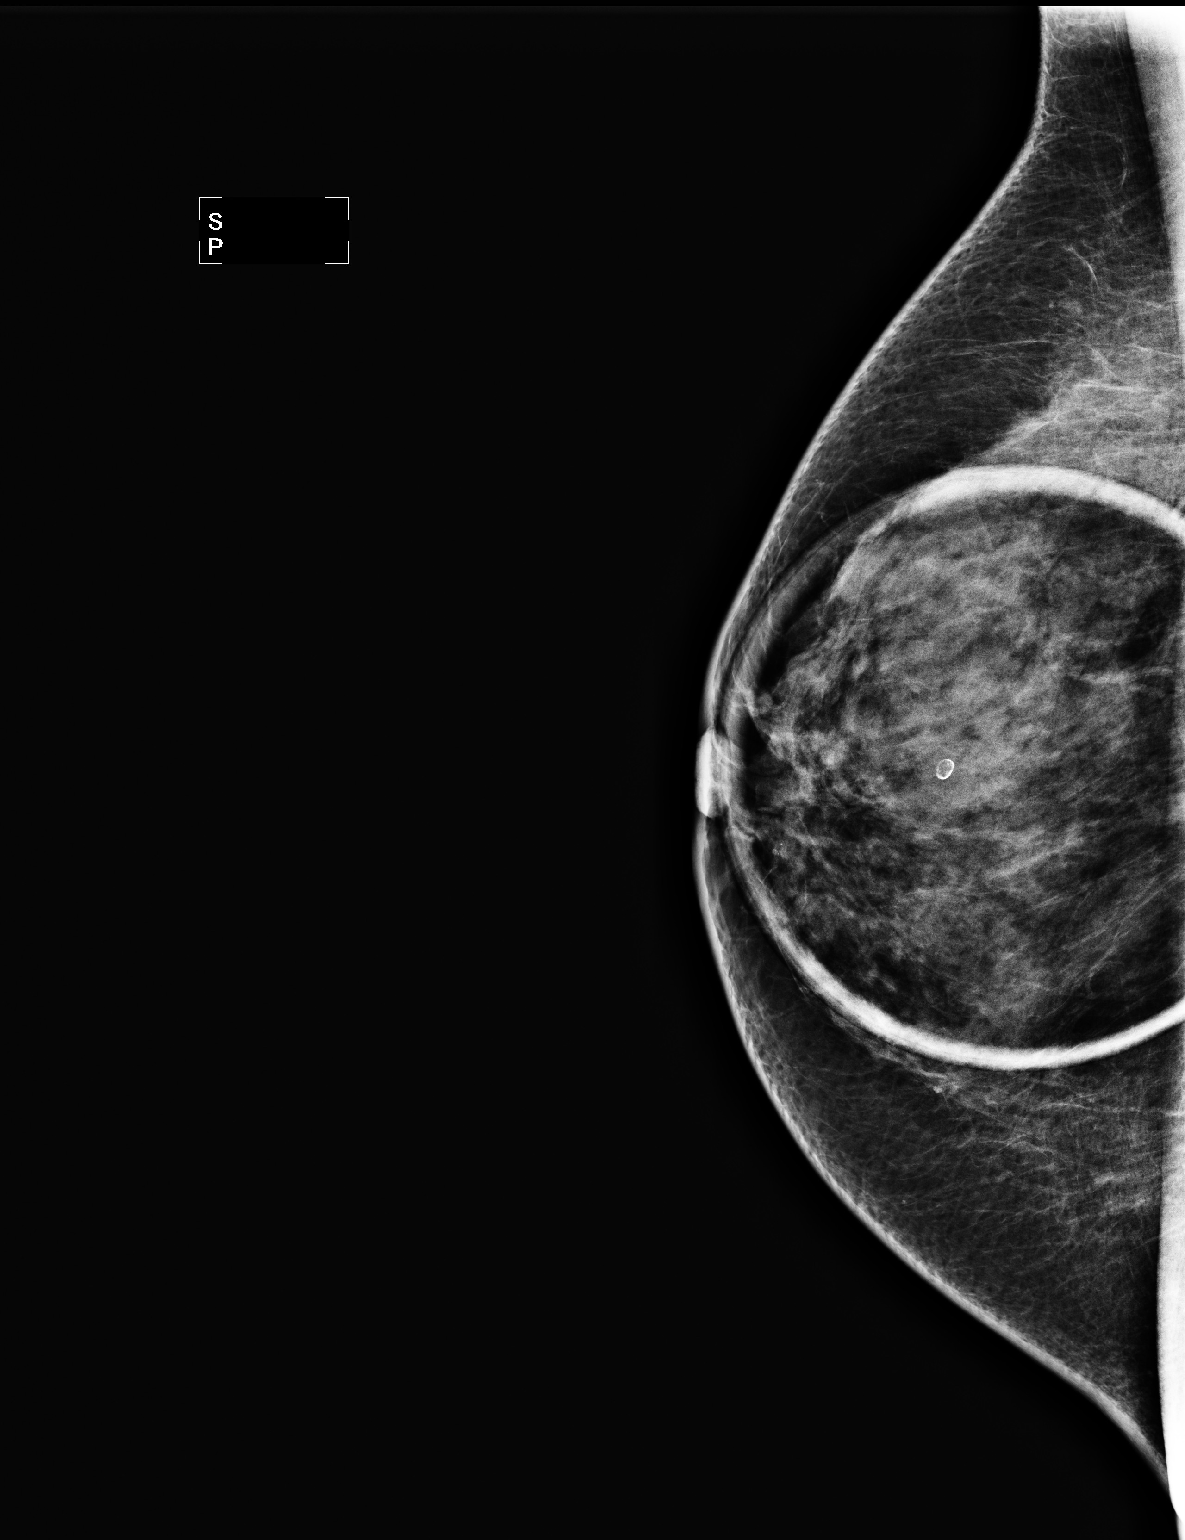

[2 of 2 positions shown; findings below may reference images not displayed]

FINDINGS: ACR Breast Density Category 3: The breast tissue is heterogeneously
dense.

Spot compression CC and MLO views of the outer right breast,
junction of middle and posterior third, were obtained.  No
persistent mass, architectural distortion or suspicious
calcifications in the right breast at the area of concern on
screening mammography.

On physical exam, I palpate no mass in the outer right breast.

Ultrasound is performed, showing normal fatty and fibroglandular
tissue throughout the outer right breast.  No cyst, solid mass or
abnormal acoustic shadowing was identified.
IMPRESSION: No mammographic or sonographic evidence of malignancy, right
breast.  The area of concern on the screening mammogram is
consistent with a summation of overlapping fibroglandular tissue.

RECOMMENDATION:
Screening mammogram in one year. (Code:Y8-N-YVN)

The patient was encouraged to perform monthly self breast
examination and communicate any changes with her primary physician.
I have discussed the findings and recommendations with the patient.
Results were also provided in writing at the conclusion of the
visit.

BI-RADS CATEGORY 1:  Negative.

## 2013-10-28 LAB — HM DEXA SCAN

## 2015-08-03 ENCOUNTER — Encounter: Payer: Self-pay | Admitting: Gastroenterology

## 2015-10-12 ENCOUNTER — Encounter: Payer: Self-pay | Admitting: Family Medicine

## 2015-10-12 DIAGNOSIS — G4700 Insomnia, unspecified: Secondary | ICD-10-CM | POA: Insufficient documentation

## 2015-10-26 ENCOUNTER — Encounter: Payer: Self-pay | Admitting: Family Medicine

## 2015-10-26 ENCOUNTER — Ambulatory Visit (INDEPENDENT_AMBULATORY_CARE_PROVIDER_SITE_OTHER): Payer: Self-pay | Admitting: Family Medicine

## 2015-10-26 VITALS — BP 103/70 | HR 82 | Temp 98.0°F | Resp 20 | Ht 62.0 in | Wt 124.8 lb

## 2015-10-26 DIAGNOSIS — M8580 Other specified disorders of bone density and structure, unspecified site: Secondary | ICD-10-CM | POA: Diagnosis not present

## 2015-10-26 DIAGNOSIS — Z7189 Other specified counseling: Secondary | ICD-10-CM | POA: Diagnosis not present

## 2015-10-26 DIAGNOSIS — G4700 Insomnia, unspecified: Secondary | ICD-10-CM | POA: Diagnosis not present

## 2015-10-26 DIAGNOSIS — N959 Unspecified menopausal and perimenopausal disorder: Secondary | ICD-10-CM

## 2015-10-26 DIAGNOSIS — Z7689 Persons encountering health services in other specified circumstances: Secondary | ICD-10-CM

## 2015-10-26 MED ORDER — TRAZODONE HCL 150 MG PO TABS: 150.0000 mg | ORAL_TABLET | Freq: Every day | ORAL | 1 refills | Status: DC

## 2015-10-26 NOTE — Progress Notes (Signed)
Patient ID: Robin Banks, female  DOB: January 29, 1956, 60 y.o.   MRN: [id number] Patient Care Team    Relationship Specialty Notifications Start End  Ma Hillock, DO PCP - General Family Medicine  10/26/15   Arvella Nigh, MD Consulting Physician Obstetrics and Gynecology  10/28/15   Tanda Rockers, MD Consulting Physician Pulmonary Disease  10/28/15     Subjective:  Robin Banks is a 60 y.o.  female present for new patient establishment. All past medical history, surgical history, allergies, family history, immunizations, medications and social history were obtained/entered in the electronic medical record today. All recent labs, ED visits and hospitalizations within the last year were reviewed.  Asthma/Cough:Paitent has a history of asthma and GERD. She reports a chronic cough on occasions. She use to be on a GERD medication. She is prescribed QVAR, which she states she take once a day. She is also prescribed Albuterol and has not needed unless sick. She has been seen by dr. Melvyn Novas (pulm) in the past. She is wondering if she needs to be seen again with her chronic cough.   Insomnia: Stable on trazodone 150 mg.   Atrophic vaginitis: vaginal drying. Menopausal symptoms. Pt has a gynecologist. She has been to seen [facility] in the past and tried on multiple hormonal creams/herbals. She states none of those ever really worked. She has also tried vaginal lubricants. She does not have hot flashes that often any more, but has night sweats. She has decreased libido and vaginal dryness.  She denies breast cancer fhx. Her mammogram is overdue. Heart disease in her father.  Health maintenance:  Colonoscopy: completed: completed about 12 years ago, normal. She is uncertain what doctor/practice completed.  Mammogram: completed: mammogram and pelvic in GYN (Dr. Leatrice Jewels Comb). Cervical cancer screening: see above, LMP at 1 Immunizations: tdap 2010, Influenza UTD (encouraged yearly), PNA  series 2014, zostavax 2014 Infectious disease screening: Unknown screen.  DEXA: 03/28/2006. Osteopenia rt hip, she takes calcium and vitamin D and weight bearing exercise Eye exam: 12/2014   Immunization History  Administered Date(s) Administered  . Influenza-Unspecified 12/26/2013  . Pneumococcal Polysaccharide-23 11/20/2012  . Tdap 02/03/2009  . Zoster 03/15/2013     Past Medical History:  Diagnosis Date  . Allergy   . Asthma   . Chicken pox   . Compression fracture of thoracic spine, non-traumatic (HCC)    T7-8  . Depression   . GERD (gastroesophageal reflux disease)   . Insomnia   . Menopause   . Overactive bladder   . TMJ (temporomandibular joint syndrome)   . Urinary incontinence    Allergies  Allergen Reactions  . Meperidine Hcl   . Nitrofurantoin    Past Surgical History:  Procedure Laterality Date  . BLADDER SUSPENSION  2011   Dr. Matilde Sprang  . CARPAL TUNNEL RELEASE Bilateral 2014  . TUBAL LIGATION Bilateral 1978   Family History  Problem Relation Age of Onset  . Hypertension Father   . CAD Father   . Hearing loss Father   . Heart disease Father   . COPD Father   . Hyperlipidemia Mother   . Arthritis Mother   . Alcohol abuse Paternal Grandfather   . Heart attack Paternal Grandfather   . CAD Paternal Grandfather   . Dementia Paternal Grandmother   . Aortic aneurysm Maternal Grandmother   . Cerebral palsy Son     Mild CP by records  . Kidney nephrosis Son  FSG, kidney transplant.    Social History   Social History  . Marital status: Married    Spouse name: N/A  . Number of children: N/A  . Years of education: N/A   Occupational History  . Not on file.   Social History Main Topics  . Smoking status: Never Smoker  . Smokeless tobacco: Never Used  . Alcohol use No  . Drug use: No  . Sexual activity: Yes    Partners: Male     Comment: married   Other Topics Concern  . Not on file   Social History Narrative   Married to [facility]).  Three children Robin Banks and Robin Banks.    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    Takes herbal remedies, daily vitamin,    Wears seatbelt   Exercises 3x a week   Smoke detector in the home.    Firearms in the home (locked)   Feels safe in relationship.         Medication List       Accurate as of 10/26/15 11:59 PM. Always use your most recent med list.          beclomethasone 40 MCG/ACT inhaler Commonly known as:  QVAR Inhale 1 puff into the lungs daily.   fluticasone 50 MCG/ACT nasal spray Commonly known as:  FLONASE Place 2 sprays into both nostrils daily.   OVER THE COUNTER MEDICATION Take 1 tablet by mouth daily. menoquil   OVER THE COUNTER MEDICATION Apply 1 application topically 2 (two) times daily.   traZODone 150 MG tablet Commonly known as:  DESYREL Take 1 tablet (150 mg total) by mouth at bedtime.        No results found for this or any previous visit (from the past 2160 hour(s)).   ROS: 14 pt review of systems performed and negative (unless mentioned in an HPI)  Objective: BP 103/70 (BP Location: Right Arm, Patient Position: Sitting, Cuff Size: Normal)   Pulse 82   Temp 98 F (36.7 C) (Oral)   Resp 20   Ht 5\' 2"  (1.575 m)   Wt 124 lb 12 oz (56.6 kg)   SpO2 97%   BMI 22.82 kg/m  Gen: Afebrile. No acute distress. Nontoxic in appearance, well-developed, well-nourished,  vert pleasant caucasian female.  HENT: AT. [place].MMM.no no Cough on exam, mild hoarseness on exam. Eyes:Pupils Equal Round Reactive to light, Extraocular movements intact,  Conjunctiva without redness, discharge or icterus. Neck/lymp/endocrine: Supple,no lymphadenopathy, no thyromegaly CV: RRR, no edema Chest: CTAB, no wheeze, rhonchi or crackles.  Abd: Soft. NTND. BS present.  Skin:  Warm and well-perfused. Skin intact. Neuro/Msk: Normal gait. PERLA. EOMi. Alert. Oriented x3.   Psych: Normal affect, dress and demeanor. Normal speech. Normal thought content and  judgment.   Assessment/plan: [name] is a 60 y.o. female present for establishment of care.  Postmenopausal symptoms - decreased Libido, night sweats, vaginal dryness. - dicussed risk vs benefit of hormone therapy. - AVS on estrogen creams/atrophic vaginitis.  - If pt would like to discuss in more detail or start medication she was encouraged to make an appt.   Insomnia - stable.  - refills provided.  - traZODone (DESYREL) 150 MG tablet; Take 1 tablet (150 mg total) by mouth at bedtime.  Dispense: 90 tablet; Refill: 1 - 6 months follow up  Osteopenia Encouraged vit d supplement and calcium Last dexa 2008.  Would check vit d during CPE  Asthma/cough:  - encouraged pt to take daily antihistamine,  flonase and start QVAR BID.  - +/- restart GERD med if QVAR BID does not resolve.  - If after taking meds appropriately she continues to have symptoms, would encouraged her to see pulm again.  - f/u 4 weeks  Greater than 45 minutes was spent with patient, greater than 50% of that time was spent face-to-face with patient counseling and coordinating care.   Return in about 3 months (around 01/26/2016) for CPE.  Electronically signed by: Howard Pouch, DO [place]

## 2015-10-26 NOTE — Patient Instructions (Addendum)
It was a pleasure meeting you today.  Ask your  pharmacist about estrogen creams/tabs intravaginally (price). This maybe of benefit for you symptoms.

## 2015-10-28 ENCOUNTER — Encounter: Payer: Self-pay | Admitting: Family Medicine

## 2015-10-28 DIAGNOSIS — N959 Unspecified menopausal and perimenopausal disorder: Secondary | ICD-10-CM | POA: Insufficient documentation

## 2015-10-28 DIAGNOSIS — N3281 Overactive bladder: Secondary | ICD-10-CM | POA: Insufficient documentation

## 2015-10-28 DIAGNOSIS — M8580 Other specified disorders of bone density and structure, unspecified site: Secondary | ICD-10-CM | POA: Insufficient documentation

## 2015-11-05 ENCOUNTER — Other Ambulatory Visit: Payer: Self-pay | Admitting: *Deleted

## 2015-11-05 MED ORDER — BECLOMETHASONE DIPROPIONATE 40 MCG/ACT IN AERS: 1.0000 | INHALATION_SPRAY | Freq: Every day | RESPIRATORY_TRACT | 3 refills | Status: DC

## 2015-11-05 MED ORDER — FLUTICASONE PROPIONATE 50 MCG/ACT NA SUSP: 2.0000 | Freq: Every day | NASAL | 3 refills | Status: DC

## 2015-11-05 NOTE — Telephone Encounter (Signed)
qvar and flonase refilled per refill protocol.

## 2015-12-07 LAB — HM PAP SMEAR

## 2015-12-08 LAB — HM PAP SMEAR

## 2015-12-08 LAB — RESULTS CONSOLE HPV: CHL HPV: NEGATIVE

## 2015-12-28 LAB — HM MAMMOGRAPHY

## 2016-01-07 ENCOUNTER — Encounter: Payer: Self-pay | Admitting: Obstetrics and Gynecology

## 2016-03-24 ENCOUNTER — Encounter: Payer: Self-pay | Admitting: Family Medicine

## 2016-03-24 ENCOUNTER — Ambulatory Visit (INDEPENDENT_AMBULATORY_CARE_PROVIDER_SITE_OTHER): Payer: PRIVATE HEALTH INSURANCE | Admitting: Family Medicine

## 2016-03-24 VITALS — BP 109/75 | HR 83 | Temp 97.4°F | Resp 20 | Wt 127.0 lb

## 2016-03-24 DIAGNOSIS — F331 Major depressive disorder, recurrent, moderate: Secondary | ICD-10-CM

## 2016-03-24 DIAGNOSIS — Z23 Encounter for immunization: Secondary | ICD-10-CM

## 2016-03-24 MED ORDER — VENLAFAXINE HCL ER 75 MG PO CP24: 75.0000 mg | ORAL_CAPSULE | Freq: Every day | ORAL | 0 refills | Status: DC

## 2016-03-24 MED ORDER — VENLAFAXINE HCL ER 37.5 MG PO CP24: 37.5000 mg | ORAL_CAPSULE | Freq: Every day | ORAL | 0 refills | Status: DC

## 2016-03-24 NOTE — Progress Notes (Signed)
Robin Banks , 1955/06/30, 60 y.o., female MRN: [id number] Patient Care Team    Relationship Specialty Notifications Start End  Ma Hillock, DO PCP - General Family Medicine  10/26/15   Arvella Nigh, MD Consulting Physician Obstetrics and Gynecology  10/28/15   Tanda Rockers, MD Consulting Physician Pulmonary Disease  10/28/15     CC: depression Subjective: Patient presents for an acute office visit today for a cough, but states that she is feeling very depressed. Discussed with her if she agrees, would rather see her for the depression today and she can follow-up for the cough if needed. Patient is in agreement.  Depression: Patient states that the last 2 months have been extra difficult for her. She finds herself to be more emotional, decreased energy, overwhelmed. She doesn't enjoy doing any of the needle work or exercising, as she has in the past. She feels resentful for her added responsibilities surrounding caring for a teenager at her age, since her daughter was murdered. She worries a lot especially about finances. Her father passed away this past 30-Jun-2022. She states she is a [place] and has turn to prayer frequently, but feels lately this just has not been enough. She states she has been on medications in the past, provided to her through her gynecologist. She had been prescribed Pristiq for approximately 2 years after her daughter's death. She states that medication worked well for her, but it was too expensive.  Mood Disorder Screen: negative today  Depression screen [facility] 2/9 03/24/2016 10/26/2015  Decreased Interest 3 0  Down, Depressed, Hopeless 2 0  PHQ - 2 Score 5 0  Altered sleeping 0 -  Tired, decreased energy 3 -  Change in appetite 0 -  Feeling bad or failure about yourself  1 -  Trouble concentrating 0 -  Moving slowly or fidgety/restless 0 -  Suicidal thoughts 0 -  PHQ-9 Score 9 -   GAD 7 : Generalized Anxiety Score 03/24/2016  Nervous, Anxious, on Edge 3    Control/stop worrying 1  Worry too much - different things 1  Trouble relaxing 2  Restless 0  Easily annoyed or irritable 1  Afraid - awful might happen 0  Total GAD 7 Score 8  Anxiety Difficulty Somewhat difficult      Allergies  Allergen Reactions  . Meperidine Hcl Nausea Only  . Nitrofurantoin Nausea Only   Social History  Substance Use Topics  . Smoking status: Never Smoker  . Smokeless tobacco: Never Used  . Alcohol use No   Past Medical History:  Diagnosis Date  . Allergy   . Asthma   . Chicken pox   . Compression fracture of thoracic spine, non-traumatic (HCC)    T7-8  . Depression   . GERD (gastroesophageal reflux disease)   . Insomnia   . Menopause   . Overactive bladder   . TMJ (temporomandibular joint syndrome)   . Urinary incontinence    Past Surgical History:  Procedure Laterality Date  . BLADDER SUSPENSION  June 29, 2009   Dr. Matilde Sprang  . CARPAL TUNNEL RELEASE Bilateral June 29, 2012  . TUBAL LIGATION Bilateral 1978   Family History  Problem Relation Age of Onset  . Hypertension Father   . CAD Father   . Hearing loss Father   . Heart disease Father   . COPD Father   . Hyperlipidemia Mother   . Arthritis Mother   . Alcohol abuse Paternal Grandfather   . Heart attack Paternal Grandfather   .  CAD Paternal Grandfather   . Dementia Paternal Grandmother   . Aortic aneurysm Maternal Grandmother   . Cerebral palsy Son     Mild CP by records  . Kidney nephrosis Son     FSG, kidney transplant.    Allergies as of 03/24/2016      Reactions   Meperidine Hcl Nausea Only   Nitrofurantoin Nausea Only      Medication List       Accurate as of 03/24/16  4:37 PM. Always use your most recent med list.          beclomethasone 40 MCG/ACT inhaler Commonly known as:  QVAR Inhale 1 puff into the lungs daily.   fluticasone 50 MCG/ACT nasal spray Commonly known as:  FLONASE Place 2 sprays into both nostrils daily.   OVER THE COUNTER MEDICATION Take 1 tablet  by mouth daily. menoquil   OVER THE COUNTER MEDICATION Apply 1 application topically 2 (two) times daily.   traZODone 150 MG tablet Commonly known as:  DESYREL Take 1 tablet (150 mg total) by mouth at bedtime.   venlafaxine XR 37.5 MG 24 hr capsule Commonly known as:  EFFEXOR XR Take 1 capsule (37.5 mg total) by mouth daily with breakfast.   venlafaxine XR 75 MG 24 hr capsule Commonly known as:  EFFEXOR XR Take 1 capsule (75 mg total) by mouth daily with breakfast.       No results found for this or any previous visit (from the past 24 hour(s)). No results found.   ROS: Negative, with the exception of above mentioned in HPI   Objective:  BP 109/75 (BP Location: Right Arm, Patient Position: Sitting, Cuff Size: Normal)   Pulse 83   Temp 97.4 F (36.3 C)   Resp 20   Wt 127 lb (57.6 kg)   SpO2 98%   BMI 23.23 kg/m  Body mass index is 23.23 kg/m. Gen: Afebrile. No acute distress. Nontoxic in appearance, well developed, well nourished. Very pleasant, tearful Caucasian female. Psych: Sad, tearful, upset. Otherwise normal dress and demeanor. Normal speech. Normal thought content and judgment. No SI or HI.  Assessment/Plan: Robin Banks is a 61 y.o. female present for acute OV for  Moderate episode of recurrent major depressive disorder ([place]) - Patient agreeable to restart a medication. Counseling services were suggested, and [facility] [place] contact information was provided to patient. - Lengthy discussion concerning her condition, treatment and medication use. - Prior history of Pristiq use, however too expensive. Therefore will try Effexor 37.5 mg for 7 days, then increase to 75 mg daily.  - Follow-up in 4 weeks.  Need for prophylactic vaccination and inoculation against influenza - Flu Vaccine QUAD 36+ mos PF IM (Fluarix & Fluzone Quad PF)    > 25 minutes spent with patient, >50% of time spent face to face counseling patient  electronically signed  by:  Howard Pouch, DO  [place]

## 2016-03-24 NOTE — Patient Instructions (Addendum)
[facility] [place]:  Start effexor 37.5 mg for 7 days and then increase to 75 mg daily and follow up in 4 weeks.  Hang in there, if you need anything please call.   Please help [place] help you:  It is a privilege to be able to take care of great patients such as yourself. We are honored you have chosen [facility] for your Primary Care home. Below you will find basic instructions that you may need to access in the future. Please help [place] help you by reading the instructions, which cover many of the frequent questions we experience.   Prescription refills and request:  -In order to allow more efficient response time, please call your pharmacy for all refills. They will forward the request electronically to [place]. This allows for the quickest possible response. Request left on a nurse line can take longer to refill, since these are checked as time allows between office patients and other phone calls.  - refill request can take up to 3-5 working days to complete.  - If request is sent electronically and request is appropiate, it is usually completed in 1-2 business days.  - all patients will need to be seen routinely for all chronic medical conditions requiring prescription medications (see follow-up below). If you are overdue for follow up on your condition, you will be asked to make an appointment and we will call in enough medication to cover you until your appointment (up to 30 days).  - all controlled substances will require a face to face visit to request/refill.  - if you desire your prescriptions to go through a new pharmacy, and have an active script at original pharmacy, you will need to call your pharmacy and have scripts transferred to new pharmacy. This is completed between the pharmacy locations and not by your provider.    Results: If any images or labs were ordered, it can take up to 1 week to get results depending on the test ordered and the lab/facility running and resulting  the test. - Normal or stable results, which do not need further discussion, will be released to your mychart immediately with attached note to you. A call will not be generated for normal results. Please make certain to sign up for mychart. If you have questions on how to activate your mychart you can call the front office.  - If your results need further discussion, our office will attempt to contact you via phone, and if unable to reach you after 2 attempts, we will release your abnormal result to your mychart with instructions.  - All results will be automatically released in mychart after 1 week.  - Your provider will provide you with explanation and instruction on all relevant material in your results. Please keep in mind, results and labs may appear confusing or abnormal to the untrained eye, but it does not mean they are actually abnormal for you personally. If you have any questions about your results that are not covered, or you desire more detailed explanation than what was provided, you should make an appointment with your provider to do so.   Our office handles many outgoing and incoming calls daily. If we have not contacted you within 1 week about your results, please check your mychart to see if there is a message first and if not, then contact our office.  In helping with this matter, you help decrease call volume, and therefore allow [place] to be able to respond  to patients needs more efficiently.   Acute office visits (sick visit):  An acute visit is intended for a new problem and are scheduled in shorter time slots to allow schedule openings for patients with new problems. This is the appropriate visit to discuss a new problem. In order to provide you with excellent quality medical care with proper time for you to explain your problem, have an exam and receive treatment with instructions, these appointments should be limited to one new problem per visit. If you experience a new problem, in  which you desire to be addressed, please make an acute office visit, we save openings on the schedule to accommodate you. Please do not save your new problem for any other type of visit, let us take care of it properly and quickly for you.   Follow up visits:  Depending on your condition(s) your provider will need to see you routinely in order to provide you with quality care and prescribe medication(s). Most chronic conditions (Example: hypertension, Diabetes, depression/anxiety... etc), require visits a couple times a year. Your provider will instruct you on proper follow up for your personal medical conditions and history. Please make certain to make follow up appointments for your condition as instructed. Failing to do so could result in lapse in your medication treatment/refills. If you request a refill, and are overdue to be seen on a condition, we will always provide you with a 30 day script (once) to allow you time to schedule.    Medicare wellness (well visit): - we have a wonderful Nurse Maudie Mercury), that will meet with you and provide you will yearly medicare wellness visits. These visits should occur yearly (can not be scheduled less than 1 calendar year apart) and cover preventive health, immunizations, advance directives and screenings you are entitled to yearly through your medicare benefits. Do not miss out on your entitled benefits, this is when medicare will pay for these benefits to be ordered for you.  These are strongly encouraged by your provider and is the appropriate type of visit to make certain you are up to date with all preventive health benefits. If you have not had your medicare wellness exam in the last 12 months, please make certain to schedule one by calling the office and schedule your medicare wellness with Maudie Mercury as soon as possible.   Yearly physical (well visit):  - Adults are recommended to be seen yearly for physicals. Check with your insurance and date of your last physical,  most insurances require one calendar year between physicals. Physicals include all preventive health topics, screenings, medical exam and labs that are appropriate for gender/age and history. You may have fasting labs needed at this visit. This is a well visit (not a sick visit), acute topics should not be covered during this visit.  - Pediatric patients are seen more frequently when they are younger. Your provider will advise you on well child visit timing that is appropriate for your their age. - This is not a medicare wellness visit. Medicare wellness exams do not have an exam portion to the visit. Some medicare companies allow for a physical, some do not allow a yearly physical. If your medicare allows a yearly physical you can schedule the medicare wellness with our nurse Maudie Mercury and have your physical with your provider after, on the same day. Please check with insurance for your full benefits.   Late Policy/No Shows:  - all new patients should arrive 15-30 minutes earlier than appointment to allow  [place] time  to  obtain all personal demographics,  insurance information and for you to complete office paperwork. - All established patients should arrive 10-15 minutes earlier than appointment time to update all information and be checked in .  - In our best efforts to run on time, if you are late for your appointment you will be asked to either reschedule or if able, we will work you back into the schedule. There will be a wait time to work you back in the schedule,  depending on availability.  - If you are unable to make it to your appointment as scheduled, please call 24 hours ahead of time to allow [place] to fill the time slot with someone else who needs to be seen. If you do not cancel your appointment ahead of time, you may be charged a no show fee.  Persistent Depressive Disorder Persistent depressive disorder (PDD) is a mental health condition. PDD causes symptoms of low-level depression for 2 years or  longer. It may also be called long-term (chronic) depression or dysthymia. PDD may include episodes of more severe depression that last for about 2 weeks (major depressive disorder or MDD). PDD can affect the way you think, feel, and sleep. This condition may also affect your relationships. You may be more likely to get sick if you have PDD. Symptoms of PDD occur for most of the day and may include:  Feeling tired (fatigue).  Low energy.  Eating too much or too little.  Sleeping too much or too little.  Feeling restless or agitated.  Feeling hopeless.  Feeling worthless or guilty.  Feeling worried or nervous (anxiety).  Trouble concentrating or making decisions.  Low self-esteem.  A negative way of looking at things (out[place]).  Not being able to have fun or feel pleasure.  Avoiding interacting with people.  Getting angry or annoyed easily (irritability).  Acting aggressive or angry. Follow these instructions at home: Activity  Go back to your normal activities as told by your doctor.  Exercise regularly as told by your doctor. General instructions  Take over-the-counter and prescription medicines only as told by your doctor.  Do not drink alcohol. Or, limit how much alcohol you drink to no more than 1 drink a day for nonpregnant women and 2 drinks a day for men. One drink equals 12 oz of beer, 5 oz of wine, or 1 oz of hard liquor. Alcohol can affect any antidepressant medicines you are taking. Talk with your doctor about your alcohol use.  Eat a healthy diet and get plenty of sleep.  Find activities that you enjoy each day.  Consider joining a support group. Your doctor may be able to suggest a support group.  Keep all follow-up visits as told by your doctor. This is important. Where to find more information: [employer] on Mental Illness  www.nami.org U.S. National Institute of Mental Health  https://carter.com/ National Suicide Prevention  Lifeline  (770) 584-3168 905-553-3841). This is free, 24-hour help. Contact a doctor if:  Your symptoms get worse.  You have new symptoms.  You have trouble sleeping or doing your daily activities. Get help right away if:  You self-harm.  You have serious thoughts about hurting yourself or others.  You see, hear, taste, smell, or feel things that are not there (hallucinate). This information is not intended to replace advice given to you by your health care provider. Make sure you discuss any questions you have with your health care provider. Document Released: 02/23/2015 Document  Revised: 11/06/2015 Document Reviewed: 11/06/2015 Elsevier Interactive Patient Education  2017 [employer].

## 2016-03-27 ENCOUNTER — Other Ambulatory Visit: Payer: Self-pay | Admitting: Family Medicine

## 2016-04-21 ENCOUNTER — Other Ambulatory Visit: Payer: Self-pay | Admitting: Family Medicine

## 2016-04-22 ENCOUNTER — Encounter: Payer: Self-pay | Admitting: Family Medicine

## 2016-04-22 ENCOUNTER — Ambulatory Visit (INDEPENDENT_AMBULATORY_CARE_PROVIDER_SITE_OTHER): Payer: PRIVATE HEALTH INSURANCE | Admitting: Family Medicine

## 2016-04-22 VITALS — BP 94/66 | HR 81 | Temp 97.9°F | Resp 20 | Ht 62.0 in

## 2016-04-22 DIAGNOSIS — G4700 Insomnia, unspecified: Secondary | ICD-10-CM

## 2016-04-22 DIAGNOSIS — J4540 Moderate persistent asthma, uncomplicated: Secondary | ICD-10-CM

## 2016-04-22 DIAGNOSIS — F331 Major depressive disorder, recurrent, moderate: Secondary | ICD-10-CM | POA: Diagnosis not present

## 2016-04-22 DIAGNOSIS — F418 Other specified anxiety disorders: Secondary | ICD-10-CM | POA: Insufficient documentation

## 2016-04-22 MED ORDER — TRAZODONE HCL 150 MG PO TABS: 150.0000 mg | ORAL_TABLET | Freq: Every day | ORAL | 1 refills | Status: DC

## 2016-04-22 MED ORDER — FLUTICASONE PROPIONATE 50 MCG/ACT NA SUSP: 2.0000 | Freq: Every day | NASAL | 11 refills | Status: DC

## 2016-04-22 MED ORDER — BECLOMETHASONE DIPROPIONATE 40 MCG/ACT IN AERS: 1.0000 | INHALATION_SPRAY | Freq: Every day | RESPIRATORY_TRACT | 11 refills | Status: DC

## 2016-04-22 MED ORDER — VENLAFAXINE HCL ER 75 MG PO CP24: 75.0000 mg | ORAL_CAPSULE | Freq: Every day | ORAL | 5 refills | Status: DC

## 2016-04-22 NOTE — Patient Instructions (Signed)
I am glad your are doing well.  Refills for 6 months provided.    Please help [place] help you:  It is a privilege to be able to take care of great patients such as yourself. We are honored you have chosen [facility] for your Primary Care home. Below you will find basic instructions that you may need to access in the future. Please help [place] help you by reading the instructions, which cover many of the frequent questions we experience.   Prescription refills and request:  -In order to allow more efficient response time, please call your pharmacy for all refills. They will forward the request electronically to [place]. This allows for the quickest possible response. Request left on a nurse line can take longer to refill, since these are checked as time allows between office patients and other phone calls.  - refill request can take up to 3-5 working days to complete.  - If request is sent electronically and request is appropiate, it is usually completed in 1-2 business days.  - all patients will need to be seen routinely for all chronic medical conditions requiring prescription medications (see follow-up below). If you are overdue for follow up on your condition, you will be asked to make an appointment and we will call in enough medication to cover you until your appointment (up to 30 days).  - all controlled substances will require a face to face visit to request/refill.  - if you desire your prescriptions to go through a new pharmacy, and have an active script at original pharmacy, you will need to call your pharmacy and have scripts transferred to new pharmacy. This is completed between the pharmacy locations and not by your provider.    Results: If any images or labs were ordered, it can take up to 1 week to get results depending on the test ordered and the lab/facility running and resulting the test. - Normal or stable results, which do not need further discussion, will be released to your mychart  immediately with attached note to you. A call will not be generated for normal results. Please make certain to sign up for mychart. If you have questions on how to activate your mychart you can call the front office.  - If your results need further discussion, our office will attempt to contact you via phone, and if unable to reach you after 2 attempts, we will release your abnormal result to your mychart with instructions.  - All results will be automatically released in mychart after 1 week.  - Your provider will provide you with explanation and instruction on all relevant material in your results. Please keep in mind, results and labs may appear confusing or abnormal to the untrained eye, but it does not mean they are actually abnormal for you personally. If you have any questions about your results that are not covered, or you desire more detailed explanation than what was provided, you should make an appointment with your provider to do so.   Our office handles many outgoing and incoming calls daily. If we have not contacted you within 1 week about your results, please check your mychart to see if there is a message first and if not, then contact our office.  In helping with this matter, you help decrease call volume, and therefore allow [place] to be able to respond to patients needs more efficiently.   Acute office visits (sick visit):  An acute visit is intended for a new problem  and are scheduled in shorter time slots to allow schedule openings for patients with new problems. This is the appropriate visit to discuss a new problem. In order to provide you with excellent quality medical care with proper time for you to explain your problem, have an exam and receive treatment with instructions, these appointments should be limited to one new problem per visit. If you experience a new problem, in which you desire to be addressed, please make an acute office visit, we save openings on the schedule to  accommodate you. Please do not save your new problem for any other type of visit, let us take care of it properly and quickly for you.   Follow up visits:  Depending on your condition(s) your provider will need to see you routinely in order to provide you with quality care and prescribe medication(s). Most chronic conditions (Example: hypertension, Diabetes, depression/anxiety... etc), require visits a couple times a year. Your provider will instruct you on proper follow up for your personal medical conditions and history. Please make certain to make follow up appointments for your condition as instructed. Failing to do so could result in lapse in your medication treatment/refills. If you request a refill, and are overdue to be seen on a condition, we will always provide you with a 30 day script (once) to allow you time to schedule.    Medicare wellness (well visit): - we have a wonderful Nurse Maudie Mercury), that will meet with you and provide you will yearly medicare wellness visits. These visits should occur yearly (can not be scheduled less than 1 calendar year apart) and cover preventive health, immunizations, advance directives and screenings you are entitled to yearly through your medicare benefits. Do not miss out on your entitled benefits, this is when medicare will pay for these benefits to be ordered for you.  These are strongly encouraged by your provider and is the appropriate type of visit to make certain you are up to date with all preventive health benefits. If you have not had your medicare wellness exam in the last 12 months, please make certain to schedule one by calling the office and schedule your medicare wellness with Maudie Mercury as soon as possible.   Yearly physical (well visit):  - Adults are recommended to be seen yearly for physicals. Check with your insurance and date of your last physical, most insurances require one calendar year between physicals. Physicals include all preventive health  topics, screenings, medical exam and labs that are appropriate for gender/age and history. You may have fasting labs needed at this visit. This is a well visit (not a sick visit), acute topics should not be covered during this visit.  - Pediatric patients are seen more frequently when they are younger. Your provider will advise you on well child visit timing that is appropriate for your their age. - This is not a medicare wellness visit. Medicare wellness exams do not have an exam portion to the visit. Some medicare companies allow for a physical, some do not allow a yearly physical. If your medicare allows a yearly physical you can schedule the medicare wellness with our nurse Maudie Mercury and have your physical with your provider after, on the same day. Please check with insurance for your full benefits.   Late Policy/No Shows:  - all new patients should arrive 15-30 minutes earlier than appointment to allow [place] time  to  obtain all personal demographics,  insurance information and for you to complete office paperwork. - All established  patients should arrive 10-15 minutes earlier than appointment time to update all information and be checked in .  - In our best efforts to run on time, if you are late for your appointment you will be asked to either reschedule or if able, we will work you back into the schedule. There will be a wait time to work you back in the schedule,  depending on availability.  - If you are unable to make it to your appointment as scheduled, please call 24 hours ahead of time to allow [place] to fill the time slot with someone else who needs to be seen. If you do not cancel your appointment ahead of time, you may be charged a no show fee.

## 2016-04-22 NOTE — Progress Notes (Signed)
Robin Banks , 03/13/1956, 61 y.o., female MRN: [id number] Patient Care Team    Relationship Specialty Notifications Start End  Ma Hillock, DO PCP - General Family Medicine  10/26/15   Arvella Nigh, MD Consulting Physician Obstetrics and Gynecology  10/28/15   Tanda Rockers, MD Consulting Physician Pulmonary Disease  10/28/15     CC: depression Subjective:   Depression:  Pt presents for depression follow up after the start of Effexor 4 weeks ago. She is doing rather well and would like to continue Effexor 75 mg QD. She has seen a mild improvement, Initially could use a higher dose, but currently wants to stay on the same dose. Prior note:  Patient states that the last 2 months have been extra difficult for her. She finds herself to be more emotional, decreased energy, overwhelmed. She doesn't enjoy doing any of the needle work or exercising, as she has in the past. She feels resentful for her added responsibilities surrounding caring for a teenager at her age, since her daughter was murdered. She worries a lot especially about finances. Her father passed away this past 2022-07-05. She states she is a [place] and has turn to prayer frequently, but feels lately this just has not been enough. She states she has been on medications in the past, provided to her through her gynecologist. She had been prescribed Pristiq for approximately 2 years after her daughter's death. She states that medication worked well for her, but it was too expensive.  Insomnia: She reports trazodone is effective, she is finding that sometimes she does have a sleep hangover the next day. She is taking the full 150 mg dose. She is sleeping whether well, and states that it is effective.  Asthma:  She reports needing refills on her Qvar, Flonase today. She feels her asthma is controlled on this regimen. He denies any shortness of breath or wheezing.   Mood Disorder Screen: negative today  Depression screen [facility] 2/9  03/24/2016 10/26/2015  Decreased Interest 3 0  Down, Depressed, Hopeless 2 0  PHQ - 2 Score 5 0  Altered sleeping 0 -  Tired, decreased energy 3 -  Change in appetite 0 -  Feeling bad or failure about yourself  1 -  Trouble concentrating 0 -  Moving slowly or fidgety/restless 0 -  Suicidal thoughts 0 -  PHQ-9 Score 9 -   GAD 7 : Generalized Anxiety Score 03/24/2016  Nervous, Anxious, on Edge 3  Control/stop worrying 1  Worry too much - different things 1  Trouble relaxing 2  Restless 0  Easily annoyed or irritable 1  Afraid - awful might happen 0  Total GAD 7 Score 8  Anxiety Difficulty Somewhat difficult      Allergies  Allergen Reactions  . Meperidine Hcl Nausea Only  . Nitrofurantoin Nausea Only   Social History  Substance Use Topics  . Smoking status: Never Smoker  . Smokeless tobacco: Never Used  . Alcohol use No   Past Medical History:  Diagnosis Date  . Allergy   . Asthma   . Chicken pox   . Compression fracture of thoracic spine, non-traumatic (HCC)    T7-8  . Depression   . GERD (gastroesophageal reflux disease)   . Insomnia   . Menopause   . Overactive bladder   . TMJ (temporomandibular joint syndrome)   . Urinary incontinence    Past Surgical History:  Procedure Laterality Date  . BLADDER SUSPENSION  07-04-2009  Dr. Matilde Sprang  . CARPAL TUNNEL RELEASE Bilateral 2014  . TUBAL LIGATION Bilateral 1978   Family History  Problem Relation Age of Onset  . Hypertension Father   . CAD Father   . Hearing loss Father   . Heart disease Father   . COPD Father   . Hyperlipidemia Mother   . Arthritis Mother   . Alcohol abuse Paternal Grandfather   . Heart attack Paternal Grandfather   . CAD Paternal Grandfather   . Dementia Paternal Grandmother   . Aortic aneurysm Maternal Grandmother   . Cerebral palsy Son     Mild CP by records  . Kidney nephrosis Son     FSG, kidney transplant.    Allergies as of 04/22/2016      Reactions   Meperidine Hcl  Nausea Only   Nitrofurantoin Nausea Only      Medication List       Accurate as of 04/22/16  8:14 AM. Always use your most recent med list.          beclomethasone 40 MCG/ACT inhaler Commonly known as:  QVAR Inhale 1 puff into the lungs daily.   fluticasone 50 MCG/ACT nasal spray Commonly known as:  FLONASE Place 2 sprays into both nostrils daily.   OVER THE COUNTER MEDICATION Take 1 tablet by mouth daily. menoquil   OVER THE COUNTER MEDICATION Apply 1 application topically 2 (two) times daily.   traZODone 150 MG tablet Commonly known as:  DESYREL Take 1 tablet (150 mg total) by mouth at bedtime.   venlafaxine XR 75 MG 24 hr capsule Commonly known as:  EFFEXOR XR Take 1 capsule (75 mg total) by mouth daily with breakfast.       No results found for this or any previous visit (from the past 24 hour(s)). No results found.   ROS: Negative, with the exception of above mentioned in HPI   Objective:  BP 94/66 (BP Location: Right Arm, Patient Position: Sitting, Cuff Size: Normal)   Pulse 81   Temp 97.9 F (36.6 C)   Resp 20   Ht 5\' 2"  (1.575 m)   SpO2 99%  There is no height or weight on file to calculate BMI. Gen: Afebrile. No acute distress.  HENT: AT. [place].  MMM.  Eyes:Pupils Equal Round Reactive to light, Extraocular movements intact,  Conjunctiva without redness, discharge or icterus. CV: RRR, no edema, +2/4 P posterior tibialis pulses Chest: CTAB, no wheeze or crackles Psych: Normal affect, dress and demeanor. Normal speech. Normal thought content and judgment. Assessment/Plan: Robin Banks is a 61 y.o. female present for acute OV for  Insomnia, unspecified type - She was encouraged to take two thirds of the pill equaling 100 mg daily at bedtime. This will allow her to still have effectiveness of trazodone down, hopefully without the oversedation in the morning. - traZODone (DESYREL) 150 MG tablet; Take 1 tablet (150 mg total) by mouth at bedtime.   Dispense: 90 tablet; Refill: 1 - Follow-up every 6 months on current issues.  Moderate persistent asthma without complication - Stable - Refills on Qvar and Flonase today.  Moderate episode of recurrent major depressive disorder (HCC) - stable doing well. - refills  On Effexor 75 Mg Qd for 6 months.  - F/U 6 months.    > 25 minutes spent with patient, >50% of time spent face to face counseling patient  electronically signed by:  Howard Pouch, DO  [place]

## 2016-10-25 ENCOUNTER — Other Ambulatory Visit: Payer: Self-pay | Admitting: Family Medicine

## 2016-10-25 DIAGNOSIS — G4700 Insomnia, unspecified: Secondary | ICD-10-CM

## 2016-12-13 ENCOUNTER — Other Ambulatory Visit: Payer: Self-pay | Admitting: *Deleted

## 2016-12-13 MED ORDER — BECLOMETHASONE DIPROP HFA 40 MCG/ACT IN AERB: 1.0000 | INHALATION_SPRAY | Freq: Every day | RESPIRATORY_TRACT | 5 refills | Status: DC

## 2016-12-13 NOTE — Telephone Encounter (Signed)
Patient had active Rx for qvar inhales 40 mcg this is no longeravailable its now dispensed as Qvar redi-inhaler 45mcg new Rx sent to pharmacy.

## 2017-01-24 ENCOUNTER — Other Ambulatory Visit: Payer: Self-pay | Admitting: Family Medicine

## 2017-01-24 DIAGNOSIS — G4700 Insomnia, unspecified: Secondary | ICD-10-CM

## 2017-01-24 NOTE — Telephone Encounter (Signed)
Dr. Kuneff pt.  

## 2017-01-24 NOTE — Telephone Encounter (Signed)
Kristopher Oppenheim  RF request for trazodone LOV: 04/22/16 Next ov: None Last written: 10/25/16 #90 w/ 0RF  Please advise. Thanks.

## 2017-01-30 ENCOUNTER — Encounter: Payer: Self-pay | Admitting: Family Medicine

## 2017-01-30 ENCOUNTER — Ambulatory Visit: Payer: No Typology Code available for payment source | Admitting: Family Medicine

## 2017-01-30 VITALS — BP 109/75 | HR 79 | Temp 98.3°F | Resp 18 | Ht 62.0 in | Wt 125.5 lb

## 2017-01-30 DIAGNOSIS — F331 Major depressive disorder, recurrent, moderate: Secondary | ICD-10-CM

## 2017-01-30 DIAGNOSIS — F418 Other specified anxiety disorders: Secondary | ICD-10-CM | POA: Diagnosis not present

## 2017-01-30 DIAGNOSIS — J4540 Moderate persistent asthma, uncomplicated: Secondary | ICD-10-CM

## 2017-01-30 DIAGNOSIS — F5101 Primary insomnia: Secondary | ICD-10-CM | POA: Diagnosis not present

## 2017-01-30 HISTORY — DX: Major depressive disorder, recurrent, moderate: F33.1

## 2017-01-30 MED ORDER — BECLOMETHASONE DIPROP HFA 40 MCG/ACT IN AERB: 1.0000 | INHALATION_SPRAY | Freq: Every day | RESPIRATORY_TRACT | 5 refills | Status: DC

## 2017-01-30 MED ORDER — FLUTICASONE PROPIONATE 50 MCG/ACT NA SUSP: 2.0000 | Freq: Every day | NASAL | 11 refills | Status: DC

## 2017-01-30 MED ORDER — VENLAFAXINE HCL ER 150 MG PO CP24: 150.0000 mg | ORAL_CAPSULE | Freq: Every day | ORAL | 0 refills | Status: DC

## 2017-01-30 NOTE — Progress Notes (Signed)
Robin Banks , 18-Sep-1955, 61 y.o., female MRN: [id number] Patient Care Team    Relationship Specialty Notifications Start End  Robin Hillock, DO PCP - General Family Medicine  10/26/15   Robin Nigh, MD Consulting Physician Obstetrics and Gynecology  10/28/15   Robin Rockers, MD Consulting Physician Pulmonary Disease  10/28/15     CC: depression Subjective:   Depression:  Pt presents for depression follow up after the start of Effexor 4 weeks ago. She is doing rather well and would like to continue Effexor 75 mg QD. She has seen a mild improvement, Initially could use a higher dose, but currently wants to stay on the same dose. Prior note:  Patient states that the last 2 months have been extra difficult for her. She finds herself to be more emotional, decreased energy, overwhelmed. She doesn't enjoy doing any of the needle work or exercising, as she has in the past. She feels resentful for her added responsibilities surrounding caring for a teenager at her age, since her daughter was murdered. She worries a lot especially about finances. Her father passed away this past Jul 20, 2022. She states she is a [place] and has turn to prayer frequently, but feels lately this just has not been enough. She states she has been on medications in the past, provided to her through her gynecologist. She had been prescribed Pristiq for approximately 2 years after her daughter's death. She states that medication worked well for her, but it was too expensive.  Insomnia: She reports trazodone is effective, she is finding that sometimes she does have a sleep hangover the next day. She is taking the full 150 mg dose. She is sleeping whether well, and states that it is effective.  Asthma:  She reports needing refills on her Qvar, Flonase today. She feels her asthma is controlled on this regimen. He denies any shortness of breath or wheezing.   Mood Disorder Screen: negative 04/22/2016  Depression screen [facility] 2/9  01/30/2017 03/24/2016 10/26/2015  Decreased Interest 2 3 0  Down, Depressed, Hopeless 2 2 0  PHQ - 2 Score 4 5 0  Altered sleeping 0 0 -  Tired, decreased energy 1 3 -  Change in appetite 0 0 -  Feeling bad or failure about yourself  0 1 -  Trouble concentrating 0 0 -  Moving slowly or fidgety/restless 0 0 -  Suicidal thoughts 0 0 -  PHQ-9 Score 5 9 -  Difficult doing work/chores Somewhat difficult - -   GAD 7 : Generalized Anxiety Score 03/24/2016  Nervous, Anxious, on Edge 3  Control/stop worrying 1  Worry too much - different things 1  Trouble relaxing 2  Restless 0  Easily annoyed or irritable 1  Afraid - awful might happen 0  Total GAD 7 Score 8  Anxiety Difficulty Somewhat difficult      Allergies  Allergen Reactions  . Meperidine Hcl Nausea Only  . Nitrofurantoin Nausea Only   Social History   Tobacco Use  . Smoking status: Never Smoker  . Smokeless tobacco: Never Used  Substance Use Topics  . Alcohol use: No   Past Medical History:  Diagnosis Date  . Allergy   . Asthma   . Chicken pox   . Compression fracture of thoracic spine, non-traumatic (HCC)    T7-8  . Depression   . GERD (gastroesophageal reflux disease)   . Insomnia   . Menopause   . Overactive bladder   . TMJ (temporomandibular joint  syndrome)   . Urinary incontinence    Past Surgical History:  Procedure Laterality Date  . BLADDER SUSPENSION  2011   Dr. Matilde Sprang  . CARPAL TUNNEL RELEASE Bilateral 2014  . TUBAL LIGATION Bilateral 1978   Family History  Problem Relation Age of Onset  . Hypertension Father   . CAD Father   . Hearing loss Father   . Heart disease Father   . COPD Father   . Hyperlipidemia Mother   . Arthritis Mother   . Alcohol abuse Paternal Grandfather   . Heart attack Paternal Grandfather   . CAD Paternal Grandfather   . Dementia Paternal Grandmother   . Aortic aneurysm Maternal Grandmother   . Cerebral palsy Son        Mild CP by records  . Kidney  nephrosis Son        FSG, kidney transplant.    Allergies as of 01/30/2017      Reactions   Meperidine Hcl Nausea Only   Nitrofurantoin Nausea Only      Medication List        Accurate as of 01/30/17  8:12 AM. Always use your most recent med list.          beclomethasone 40 MCG/ACT inhaler Commonly known as:  QVAR REDIHALER Inhale 1 puff into the lungs daily.   fluticasone 50 MCG/ACT nasal spray Commonly known as:  FLONASE Place 2 sprays into both nostrils daily.   OVER THE COUNTER MEDICATION Take 1 tablet by mouth daily. menoquil   OVER THE COUNTER MEDICATION Apply 1 application topically 2 (two) times daily.   traZODone 150 MG tablet Commonly known as:  DESYREL TAKE ONE TABLET BY MOUTH AT BEDTIME   venlafaxine XR 75 MG 24 hr capsule Commonly known as:  EFFEXOR XR Take 1 capsule (75 mg total) by mouth daily with breakfast.       No results found for this or any previous visit (from the past 24 hour(s)). No results found.   ROS: Negative, with the exception of above mentioned in HPI   Objective:  BP 109/75 (BP Location: Right Arm, Patient Position: Sitting, Cuff Size: Normal)   Pulse 79   Temp 98.3 F (36.8 C)   Resp 18   Ht 5\' 2"  (1.575 m)   Wt 125 lb 8 oz (56.9 kg)   SpO2 99%   BMI 22.95 kg/m  Body mass index is 22.95 kg/m. Gen: Afebrile. No acute distress. Nontoxic in appearance. Pleasant caucasian female.  HENT: AT. [place].  MMM.  Eyes:Pupils Equal Round Reactive to light, Extraocular movements intact,  Conjunctiva without redness, discharge or icterus. Neuro: Normal gait. PERLA. EOMi. Alert. Oriented x3  Psych: tearful at times. Normal affect, dress and demeanor. Normal speech. Normal thought content and judgment.   Assessment/Plan: Robin Banks is a 61 y.o. female present for acute OV for  Insomnia, unspecified type - Stable - Continue trazodone 150 mg Qhs - traZODone (DESYREL) 150 MG tablet; Take 1 tablet (150 mg total) by mouth at bedtime.   Dispense: 90 tablet; Refill: 1 - Follow-up every 6 months on current issues.  Moderate persistent asthma without complication - Stable - Refills on Qvar and Flonase today.  Moderate episode of recurrent major depressive disorder ([place]) - Not controlled, increase to Effexor to 150 mg QD. - [facility] [place] contact information. She does not have insurance coverage for psych.  - Follow in 3 months (can be her physical if doing alright-otherwise separate appt  needed)     > 25 minutes spent with patient, >50% of time spent face to face counseling patient  electronically signed by:  Howard Pouch, DO  [place]

## 2017-01-30 NOTE — Patient Instructions (Signed)
It was a pleasure to see you today.  We have increased your Effexor to 150 mg a day (this is the next dose).   Look into the [facility], as an option.    Follow in 3 months --> this can be your physical as long as doing alright with medication.

## 2017-01-31 ENCOUNTER — Telehealth: Payer: Self-pay | Admitting: *Deleted

## 2017-01-31 DIAGNOSIS — G4700 Insomnia, unspecified: Secondary | ICD-10-CM

## 2017-01-31 MED ORDER — TRAZODONE HCL 150 MG PO TABS: 150.0000 mg | ORAL_TABLET | Freq: Every day | ORAL | 1 refills | Status: DC

## 2017-01-31 NOTE — Telephone Encounter (Signed)
Patient called and states she was supposed to get a refill on her Trazodone but it wasn't sent to pharmacy. Patient was seen 01/30/17. She is out of this. Please advise.

## 2017-01-31 NOTE — Telephone Encounter (Signed)
Refilled her medication for her, my apologies.

## 2017-02-01 NOTE — Telephone Encounter (Signed)
Left message for patient

## 2017-04-06 ENCOUNTER — Ambulatory Visit: Payer: PRIVATE HEALTH INSURANCE | Admitting: Family Medicine

## 2017-04-06 ENCOUNTER — Encounter: Payer: Self-pay | Admitting: Family Medicine

## 2017-04-06 VITALS — BP 95/63 | HR 78 | Temp 98.0°F | Ht 61.0 in | Wt 123.4 lb

## 2017-04-06 DIAGNOSIS — Z13220 Encounter for screening for lipoid disorders: Secondary | ICD-10-CM | POA: Diagnosis not present

## 2017-04-06 DIAGNOSIS — F418 Other specified anxiety disorders: Secondary | ICD-10-CM | POA: Diagnosis not present

## 2017-04-06 DIAGNOSIS — K5900 Constipation, unspecified: Secondary | ICD-10-CM

## 2017-04-06 DIAGNOSIS — F331 Major depressive disorder, recurrent, moderate: Secondary | ICD-10-CM

## 2017-04-06 DIAGNOSIS — G4700 Insomnia, unspecified: Secondary | ICD-10-CM | POA: Diagnosis not present

## 2017-04-06 DIAGNOSIS — Z0000 Encounter for general adult medical examination without abnormal findings: Secondary | ICD-10-CM | POA: Diagnosis not present

## 2017-04-06 DIAGNOSIS — Z130 Encounter for screening for diseases of the blood and blood-forming organs and certain disorders involving the immune mechanism: Secondary | ICD-10-CM | POA: Diagnosis not present

## 2017-04-06 DIAGNOSIS — Z131 Encounter for screening for diabetes mellitus: Secondary | ICD-10-CM | POA: Diagnosis not present

## 2017-04-06 DIAGNOSIS — Z79899 Other long term (current) drug therapy: Secondary | ICD-10-CM | POA: Diagnosis not present

## 2017-04-06 LAB — CBC WITH DIFFERENTIAL/PLATELET
BASOS ABS: 0 10*3/uL (ref 0.0–0.1)
BASOS PCT: 0.9 % (ref 0.0–3.0)
EOS ABS: 0.1 10*3/uL (ref 0.0–0.7)
Eosinophils Relative: 3.2 % (ref 0.0–5.0)
HEMATOCRIT: 40.2 % (ref 36.0–46.0)
HEMOGLOBIN: 13 g/dL (ref 12.0–15.0)
LYMPHS PCT: 32.6 % (ref 12.0–46.0)
Lymphs Abs: 1.3 10*3/uL (ref 0.7–4.0)
MCHC: 32.4 g/dL (ref 30.0–36.0)
MCV: 97.1 fl (ref 78.0–100.0)
Monocytes Absolute: 0.5 10*3/uL (ref 0.1–1.0)
Monocytes Relative: 13 % — ABNORMAL HIGH (ref 3.0–12.0)
Neutro Abs: 2 10*3/uL (ref 1.4–7.7)
Neutrophils Relative %: 50.3 % (ref 43.0–77.0)
Platelets: 233 10*3/uL (ref 150.0–400.0)
RBC: 4.14 Mil/uL (ref 3.87–5.11)
RDW: 13.2 % (ref 11.5–15.5)
WBC: 4 10*3/uL (ref 4.0–10.5)

## 2017-04-06 LAB — COMPREHENSIVE METABOLIC PANEL
ALBUMIN: 4.1 g/dL (ref 3.5–5.2)
ALT: 16 U/L (ref 0–35)
AST: 19 U/L (ref 0–37)
Alkaline Phosphatase: 44 U/L (ref 39–117)
BILIRUBIN TOTAL: 0.5 mg/dL (ref 0.2–1.2)
BUN: 14 mg/dL (ref 6–23)
CALCIUM: 9.3 mg/dL (ref 8.4–10.5)
CO2: 33 mEq/L — ABNORMAL HIGH (ref 19–32)
CREATININE: 0.78 mg/dL (ref 0.40–1.20)
Chloride: 103 mEq/L (ref 96–112)
GFR: 79.15 mL/min (ref >60.00)
Glucose, Bld: 90 mg/dL (ref 70–99)
Potassium: 4.5 mEq/L (ref 3.5–5.1)
Sodium: 140 mEq/L (ref 135–145)
Total Protein: 6.7 g/dL (ref 6.0–8.3)

## 2017-04-06 LAB — LIPID PANEL
CHOLESTEROL: 223 mg/dL — AB (ref 0–200)
HDL: 65.2 mg/dL (ref >39.00)
LDL Cholesterol: 145 mg/dL — ABNORMAL HIGH (ref 0–99)
NonHDL: 157.6
TRIGLYCERIDES: 65 mg/dL (ref 0.0–149.0)
Total CHOL/HDL Ratio: 3
VLDL: 13 mg/dL (ref 0.0–40.0)

## 2017-04-06 LAB — HEMOGLOBIN A1C: HEMOGLOBIN A1C: 6 % (ref 4.6–6.5)

## 2017-04-06 LAB — TSH: TSH: 2.08 u[IU]/mL (ref 0.35–4.50)

## 2017-04-06 MED ORDER — TRAZODONE HCL 150 MG PO TABS: 150.0000 mg | ORAL_TABLET | Freq: Every day | ORAL | 1 refills | Status: DC

## 2017-04-06 MED ORDER — VENLAFAXINE HCL ER 150 MG PO CP24: 150.0000 mg | ORAL_CAPSULE | Freq: Every day | ORAL | 0 refills | Status: DC

## 2017-04-06 NOTE — Patient Instructions (Signed)
Look into the coverage of fecal DNA or ColoGuard testing for colon cancer.   Health Maintenance, Female Adopting a healthy lifestyle and getting preventive care can go a long way to promote health and wellness. Talk with your health care provider about what schedule of regular examinations is right for you. This is a good chance for you to check in with your provider about disease prevention and staying healthy. In between checkups, there are plenty of things you can do on your own. Experts have done a lot of research about which lifestyle changes and preventive measures are most likely to keep you healthy. Ask your health care provider for more information. Weight and diet Eat a healthy diet  Be sure to include plenty of vegetables, fruits, low-fat dairy products, and lean protein.  Do not eat a lot of foods high in solid fats, added sugars, or salt.  Get regular exercise. This is one of the most important things you can do for your health. ? Most adults should exercise for at least 150 minutes each week. The exercise should increase your heart rate and make you sweat (moderate-intensity exercise). ? Most adults should also do strengthening exercises at least twice a week. This is in addition to the moderate-intensity exercise.  Maintain a healthy weight  Body mass index (BMI) is a measurement that can be used to identify possible weight problems. It estimates body fat based on height and weight. Your health care provider can help determine your BMI and help you achieve or maintain a healthy weight.  For females 73 years of age and older: ? A BMI below 18.5 is considered underweight. ? A BMI of 18.5 to 24.9 is normal. ? A BMI of 25 to 29.9 is considered overweight. ? A BMI of 30 and above is considered obese.  Watch levels of cholesterol and blood lipids  You should start having your blood tested for lipids and cholesterol at 64 years of age, then have this test every 5 years.  You may  need to have your cholesterol levels checked more often if: ? Your lipid or cholesterol levels are high. ? You are older than 64 years of age. ? You are at high risk for heart disease.  Cancer screening Lung Cancer  Lung cancer screening is recommended for adults 77-60 years old who are at high risk for lung cancer because of a history of smoking.  A yearly low-dose CT scan of the lungs is recommended for people who: ? Currently smoke. ? Have quit within the past 15 years. ? Have at least a 30-pack-year history of smoking. A pack year is smoking an average of one pack of cigarettes a day for 1 year.  Yearly screening should continue until it has been 15 years since you quit.  Yearly screening should stop if you develop a health problem that would prevent you from having lung cancer treatment.  Breast Cancer  Practice breast self-awareness. This means understanding how your breasts normally appear and feel.  It also means doing regular breast self-exams. Let your health care provider know about any changes, no matter how small.  If you are in your 20s or 30s, you should have a clinical breast exam (CBE) by a health care provider every 1-3 years as part of a regular health exam.  If you are 13 or older, have a CBE every year. Also consider having a breast X-ray (mammogram) every year.  If you have a family history of breast cancer, talk  to your health care provider about genetic screening.  If you are at high risk for breast cancer, talk to your health care provider about having an MRI and a mammogram every year.  Breast cancer gene (BRCA) assessment is recommended for women who have family members with BRCA-related cancers. BRCA-related cancers include: ? Breast. ? Ovarian. ? Tubal. ? Peritoneal cancers.  Results of the assessment will determine the need for genetic counseling and BRCA1 and BRCA2 testing.  Cervical Cancer Your health care provider may recommend that you be  screened regularly for cancer of the pelvic organs (ovaries, uterus, and vagina). This screening involves a pelvic examination, including checking for microscopic changes to the surface of your cervix (Pap test). You may be encouraged to have this screening done every 3 years, beginning at age 42.  For women ages 66-65, health care providers may recommend pelvic exams and Pap testing every 3 years, or they may recommend the Pap and pelvic exam, combined with testing for human papilloma virus (HPV), every 5 years. Some types of HPV increase your risk of cervical cancer. Testing for HPV may also be done on women of any age with unclear Pap test results.  Other health care providers may not recommend any screening for nonpregnant women who are considered low risk for pelvic cancer and who do not have symptoms. Ask your health care provider if a screening pelvic exam is right for you.  If you have had past treatment for cervical cancer or a condition that could lead to cancer, you need Pap tests and screening for cancer for at least 20 years after your treatment. If Pap tests have been discontinued, your risk factors (such as having a new sexual partner) need to be reassessed to determine if screening should resume. Some women have medical problems that increase the chance of getting cervical cancer. In these cases, your health care provider may recommend more frequent screening and Pap tests.  Colorectal Cancer  This type of cancer can be detected and often prevented.  Routine colorectal cancer screening usually begins at 64 years of age and continues through 64 years of age.  Your health care provider may recommend screening at an earlier age if you have risk factors for colon cancer.  Your health care provider may also recommend using home test kits to check for hidden blood in the stool.  A small camera at the end of a tube can be used to examine your colon directly (sigmoidoscopy or colonoscopy).  This is done to check for the earliest forms of colorectal cancer.  Routine screening usually begins at age 44.  Direct examination of the colon should be repeated every 5-10 years through 64 years of age. However, you may need to be screened more often if early forms of precancerous polyps or small growths are found.  Skin Cancer  Check your skin from head to toe regularly.  Tell your health care provider about any new moles or changes in moles, especially if there is a change in a mole's shape or color.  Also tell your health care provider if you have a mole that is larger than the size of a pencil eraser.  Always use sunscreen. Apply sunscreen liberally and repeatedly throughout the day.  Protect yourself by wearing long sleeves, pants, a wide-brimmed hat, and sunglasses whenever you are outside.  Heart disease, diabetes, and high blood pressure  High blood pressure causes heart disease and increases the risk of stroke. High blood pressure is more  likely to develop in: ? People who have blood pressure in the high end of the normal range (130-139/85-89 mm Hg). ? People who are overweight or obese. ? People who are African American.  If you are 36-78 years of age, have your blood pressure checked every 3-5 years. If you are 46 years of age or older, have your blood pressure checked every year. You should have your blood pressure measured twice-once when you are at a hospital or clinic, and once when you are not at a hospital or clinic. Record the average of the two measurements. To check your blood pressure when you are not at a hospital or clinic, you can use: ? An automated blood pressure machine at a pharmacy. ? A home blood pressure monitor.  If you are between 20 years and 57 years old, ask your health care provider if you should take aspirin to prevent strokes.  Have regular diabetes screenings. This involves taking a blood sample to check your fasting blood sugar level. ? If  you are at a normal weight and have a low risk for diabetes, have this test once every three years after 64 years of age. ? If you are overweight and have a high risk for diabetes, consider being tested at a younger age or more often. Preventing infection Hepatitis B  If you have a higher risk for hepatitis B, you should be screened for this virus. You are considered at high risk for hepatitis B if: ? You were born in a country where hepatitis B is common. Ask your health care provider which countries are considered high risk. ? Your parents were born in a high-risk country, and you have not been immunized against hepatitis B (hepatitis B vaccine). ? You have HIV or AIDS. ? You use needles to inject street drugs. ? You live with someone who has hepatitis B. ? You have had sex with someone who has hepatitis B. ? You get hemodialysis treatment. ? You take certain medicines for conditions, including cancer, organ transplantation, and autoimmune conditions.  Hepatitis C  Blood testing is recommended for: ? Everyone born from [birth year] through 1967. ? Anyone with known risk factors for hepatitis C.  Sexually transmitted infections (STIs)  You should be screened for sexually transmitted infections (STIs) including gonorrhea and chlamydia if: ? You are sexually active and are younger than 64 years of age. ? You are older than 64 years of age and your health care provider tells you that you are at risk for this type of infection. ? Your sexual activity has changed since you were last screened and you are at an increased risk for chlamydia or gonorrhea. Ask your health care provider if you are at risk.  If you do not have HIV, but are at risk, it may be recommended that you take a prescription medicine daily to prevent HIV infection. This is called pre-exposure prophylaxis (PrEP). You are considered at risk if: ? You are sexually active and do not regularly use condoms or know the HIV status of your  partner(s). ? You take drugs by injection. ? You are sexually active with a partner who has HIV.  Talk with your health care provider about whether you are at high risk of being infected with HIV. If you choose to begin PrEP, you should first be tested for HIV. You should then be tested every 3 months for as long as you are taking PrEP. Pregnancy  If you are premenopausal and you may  become pregnant, ask your health care provider about preconception counseling.  If you may become pregnant, take 400 to 800 micrograms (mcg) of folic acid every day.  If you want to prevent pregnancy, talk to your health care provider about birth control (contraception). Osteoporosis and menopause  Osteoporosis is a disease in which the bones lose minerals and strength with aging. This can result in serious bone fractures. Your risk for osteoporosis can be identified using a bone density scan.  If you are 40 years of age or older, or if you are at risk for osteoporosis and fractures, ask your health care provider if you should be screened.  Ask your health care provider whether you should take a calcium or vitamin D supplement to lower your risk for osteoporosis.  Menopause may have certain physical symptoms and risks.  Hormone replacement therapy may reduce some of these symptoms and risks. Talk to your health care provider about whether hormone replacement therapy is right for you. Follow these instructions at home:  Schedule regular health, dental, and eye exams.  Stay current with your immunizations.  Do not use any tobacco products including cigarettes, chewing tobacco, or electronic cigarettes.  If you are pregnant, do not drink alcohol.  If you are breastfeeding, limit how much and how often you drink alcohol.  Limit alcohol intake to no more than 1 drink per day for nonpregnant women. One drink equals 12 ounces of beer, 5 ounces of wine, or 1 ounces of hard liquor.  Do not use street  drugs.  Do not share needles.  Ask your health care provider for help if you need support or information about quitting drugs.  Tell your health care provider if you often feel depressed.  Tell your health care provider if you have ever been abused or do not feel safe at home. This information is not intended to replace advice given to you by your health care provider. Make sure you discuss any questions you have with your health care provider. Document Released: 09/27/2010 Document Revised: 08/20/2015 Document Reviewed: 12/16/2014 Elsevier Interactive Patient Education  [employer].

## 2017-04-06 NOTE — Progress Notes (Signed)
Patient ID: Robin Banks, female  DOB: 1955-08-22, 62 y.o.   MRN: [id number] Patient Care Team    Relationship Specialty Notifications Start End  Ma Hillock, DO PCP - General Family Medicine  10/26/15   Arvella Nigh, MD Consulting Physician Obstetrics and Gynecology  10/28/15   Tanda Rockers, MD Consulting Physician Pulmonary Disease  10/28/15     Chief Complaint  Patient presents with  . Annual Exam    pts is here for her annnual physical    Subjective:  Robin Banks is a 62 y.o.  Female  present for CPE. All past medical history, surgical history, allergies, family history, immunizations, medications and social history were  in the electronic medical record today. All recent labs, ED visits and hospitalizations within the last year were reviewed.  Depression with anxiety/insomnia: Patient reports she is doing well on Effexor 150 mg daily and trazodone 150 mg daily at bedtime. She is suffering from some mild constipation she attributes to one of these medications.  Health maintenance:  Colonoscopy: completed: completed about 12 years ago, normal. She is uncertain what doctor/practice completed 2005. She reports her insurance does not cover a preventive colonoscopy. Mammogram: completed: mammogram and pelvic at [facility] (Dr. Leatrice Jewels Comb). Cervical cancer screening: see above, LMP at 13 Immunizations: tdap 2010, Influenza UTD (encouraged yearly), PNA series 2014, zostavax 2014 Infectious disease screening: Unknown screen.  DEXA: 2016. Osteopenia rt hip, she takes calcium and vitamin D and weight bearing exercise Eye exam: 12/2014 Assistive device: none Oxygen use: none Patient has a Dental home. Hospitalizations/ED visits: reviewed  Depression screen [facility] 2/9 04/06/2017 01/30/2017 03/24/2016 10/26/2015  Decreased Interest 0 2 3 0  Down, Depressed, Hopeless 0 2 2 0  PHQ - 2 Score 0 4 5 0  Altered sleeping 0 0 0 -  Tired, decreased energy 0 1 3 -  Change in appetite 0 0 0  -  Feeling bad or failure about yourself  0 0 1 -  Trouble concentrating 0 0 0 -  Moving slowly or fidgety/restless 0 0 0 -  Suicidal thoughts 0 0 0 -  PHQ-9 Score 0 5 9 -  Difficult doing work/chores Not difficult at all Somewhat difficult - -   GAD 7 : Generalized Anxiety Score 04/06/2017 03/24/2016  Nervous, Anxious, on Edge 0 3  Control/stop worrying 0 1  Worry too much - different things 0 1  Trouble relaxing 0 2  Restless 0 0  Easily annoyed or irritable 0 1  Afraid - awful might happen 0 0  Total GAD 7 Score 0 8  Anxiety Difficulty Not difficult at all Somewhat difficult     Current Exercise Habits: Home exercise routine;Structured exercise class, Type of exercise: treadmill;strength training/weights, Time (Minutes): 45, Frequency (Times/Week): 2, Weekly Exercise (Minutes/Week): 90, Intensity: Mild     Immunization History  Administered Date(s) Administered  . Influenza,inj,Quad PF,6+ Mos 03/24/2016  . Influenza-Unspecified 12/26/2013, 01/24/2017  . Pneumococcal Polysaccharide-23 11/20/2012  . Tdap 02/03/2009  . Zoster 03/15/2013     Past Medical History:  Diagnosis Date  . Allergy   . Asthma   . Chicken pox   . Compression fracture of thoracic spine, non-traumatic (HCC)    T7-8  . Depression   . GERD (gastroesophageal reflux disease)   . Insomnia   . Menopause   . Overactive bladder   . TMJ (temporomandibular joint syndrome)   . Urinary incontinence    Allergies  Allergen Reactions  .  Meperidine Hcl Nausea Only  . Nitrofurantoin Nausea Only   Past Surgical History:  Procedure Laterality Date  . BLADDER SUSPENSION  2011   Dr. Matilde Sprang  . CARPAL TUNNEL RELEASE Bilateral 2014  . TUBAL LIGATION Bilateral 1978   Family History  Problem Relation Age of Onset  . Hypertension Father   . CAD Father   . Hearing loss Father   . Heart disease Father   . COPD Father   . Hyperlipidemia Mother   . Arthritis Mother   . Alcohol abuse Paternal Grandfather    . Heart attack Paternal Grandfather   . CAD Paternal Grandfather   . Dementia Paternal Grandmother   . Aortic aneurysm Maternal Grandmother   . Cerebral palsy Son        Mild CP by records  . Kidney nephrosis Son        FSG, kidney transplant.    Social History   Socioeconomic History  . Marital status: Married    Spouse name: Not on file  . Number of children: Not on file  . Years of education: Not on file  . Highest education level: Not on file  Social Needs  . Financial resource strain: Not on file  . Food insecurity - worry: Not on file  . Food insecurity - inability: Not on file  . Transportation needs - medical: Not on file  . Transportation needs - non-medical: Not on file  Occupational History  . Not on file  Tobacco Use  . Smoking status: Never Smoker  . Smokeless tobacco: Never Used  Substance and Sexual Activity  . Alcohol use: No  . Drug use: No  . Sexual activity: Yes    Partners: Male    Comment: married  Other Topics Concern  . Not on file  Social History Narrative   Married to [facility] Three children [name] and [name].    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    Takes herbal remedies, daily vitamin,    Wears seatbelt   Exercises 3x a week   Smoke detector in the home.    Firearms in the home (locked)   Feels safe in relationship.    Allergies as of 04/06/2017      Reactions   Meperidine Hcl Nausea Only   Nitrofurantoin Nausea Only      Medication List        Accurate as of 04/06/17 10:32 AM. Always use your most recent med list.          beclomethasone 40 MCG/ACT inhaler Commonly known as:  QVAR REDIHALER Inhale 1 puff daily into the lungs.   fluticasone 50 MCG/ACT nasal spray Commonly known as:  FLONASE Place 2 sprays daily into both nostrils.   OVER THE COUNTER MEDICATION Take 1 tablet by mouth daily. menoquil   OVER THE COUNTER MEDICATION Apply 1 application topically 2 (two) times daily.   traZODone 150 MG  tablet Commonly known as:  DESYREL Take 1 tablet (150 mg total) by mouth at bedtime.   venlafaxine XR 150 MG 24 hr capsule Commonly known as:  EFFEXOR-XR Take 1 capsule (150 mg total) by mouth daily with breakfast.       All past medical history, surgical history, allergies, family history, immunizations andmedications were updated in the EMR today and reviewed under the history and medication portions of their EMR.     No results found for this or any previous visit (from the past 2160 hour(s)).  ROS: 14 pt review of systems  performed and negative (unless mentioned in an HPI)  Objective: BP 95/63 (BP Location: Right Arm, Patient Position: Sitting, Cuff Size: Normal)   Pulse 78   Temp 98 F (36.7 C) (Oral)   Ht _0  (1.549 m)   Wt 123 lb 6.4 oz (56 kg)   SpO2 99%   BMI 23.32 kg/m  Gen: Afebrile. No acute distress. Nontoxic in appearance, well-developed, well-nourished,  pleasant Caucasian female. HENT: AT. [place]. Bilateral TM visualized and normal in appearance, normal external auditory canal. MMM, no oral lesions, adequate dentition. Bilateral nares within normal limits. Throat without erythema, ulcerations or exudates. no Cough on exam, no hoarseness on exam. Eyes:Pupils Equal Round Reactive to light, Extraocular movements intact,  Conjunctiva without redness, discharge or icterus. Neck/lymp/endocrine: Supple,no lymphadenopathy, no thyromegaly CV: RRR no murmur, no edema, +2/4 P posterior tibialis pulses. No carotid bruits. No JVD. Chest: CTAB, no wheeze, rhonchi or crackles. Normal Respiratory effort. Good Air movement. Abd: Soft. Flat. NTND. BS present. No Masses palpated. No hepatosplenomegaly. No rebound tenderness or guarding. Skin: No rashes, purpura or petechiae. Warm and well-perfused. Skin intact. Neuro/Msk:  Normal gait. PERLA. EOMi. Alert. Oriented x3.  Cranial nerves II through XII intact. Muscle strength 5/5 upper/lower extremity. DTRs equal bilaterally. Psych: Normal  affect, dress and demeanor. Normal speech. Normal thought content and judgment.   No exam data present  Assessment/plan: LILYANAH CELESTIN is a 62 y.o. female present for CPE. 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 Refills on Effexor 150 mg daily. - venlafaxine XR (EFFEXOR-XR) 150 MG 24 hr capsule; Take 1 capsule (150 mg total) by mouth daily with breakfast.  Dispense: 90 capsule; Refill: 0 - f/u 6 month Insomnia, unspecified type - stable. Continue trazodone. Refills provided today - TSH - traZODone (DESYREL) 150 MG tablet; Take 1 tablet (150 mg total) by mouth at bedtime.  Dispense: 90 tablet; Refill: 1 f/u 6 mos.  Encounter for long-term (current) use of medications - CBC w/Diff - Comp Met (CMET) Screening cholesterol level - Lipid panel Diabetes mellitus screening - HgB A1c Screening for deficiency anemia - CBC w/Diff Constipation, unspecified constipation type - Increasing water consumption. In using MiraLAX taper to regulate bowels. Encounter for preventive health examination Patient was encouraged to exercise greater than 150 minutes a week. Patient was encouraged to choose a diet filled with fresh fruits and vegetables, and lean meats. AVS provided to patient today for education/recommendation on gender specific health and safety maintenance. Colonoscopy: completed: completed about 12 years ago, normal. She is uncertain what doctor/practice completed 2005. She reports her insurance does not cover a preventive colonoscopy. Mammogram: completed: mammogram and pelvic at [facility] (Dr. Leatrice Jewels Comb). Cervical cancer screening: see above, LMP at 53 Immunizations: tdap 2010, Influenza UTD (encouraged yearly), PNA series 2014, zostavax 2014 Infectious disease screening: Unknown screen.  DEXA: 2016. Osteopenia rt hip, she takes calcium and vitamin D and weight bearing exercise  Return in about 1 year (around 04/06/2018) for CPE. Six-month chronic  conditions will follow-up  Electronically signed by: Howard Pouch, DO [place]

## 2017-04-07 ENCOUNTER — Encounter: Payer: Self-pay | Admitting: *Deleted

## 2017-04-07 ENCOUNTER — Encounter: Payer: Self-pay | Admitting: Family Medicine

## 2017-04-07 DIAGNOSIS — R7309 Other abnormal glucose: Secondary | ICD-10-CM | POA: Insufficient documentation

## 2017-04-14 ENCOUNTER — Encounter: Payer: Self-pay | Admitting: Family Medicine

## 2017-07-22 ENCOUNTER — Other Ambulatory Visit: Payer: Self-pay | Admitting: Family Medicine

## 2017-07-22 DIAGNOSIS — F331 Major depressive disorder, recurrent, moderate: Secondary | ICD-10-CM

## 2017-08-23 ENCOUNTER — Ambulatory Visit: Payer: Self-pay | Admitting: Family Medicine

## 2017-08-23 ENCOUNTER — Ambulatory Visit: Payer: PRIVATE HEALTH INSURANCE | Admitting: Family Medicine

## 2017-08-23 ENCOUNTER — Encounter: Payer: Self-pay | Admitting: Family Medicine

## 2017-08-23 VITALS — BP 126/86 | HR 81 | Temp 98.0°F | Resp 20 | Ht 61.0 in | Wt 120.0 lb

## 2017-08-23 DIAGNOSIS — L309 Dermatitis, unspecified: Secondary | ICD-10-CM

## 2017-08-23 DIAGNOSIS — R21 Rash and other nonspecific skin eruption: Secondary | ICD-10-CM | POA: Diagnosis not present

## 2017-08-23 DIAGNOSIS — R220 Localized swelling, mass and lump, head: Secondary | ICD-10-CM

## 2017-08-23 LAB — CBC WITH DIFFERENTIAL/PLATELET
BASOS PCT: 0.7 % (ref 0.0–3.0)
Basophils Absolute: 0 10*3/uL (ref 0.0–0.1)
EOS PCT: 2.5 % (ref 0.0–5.0)
Eosinophils Absolute: 0.1 10*3/uL (ref 0.0–0.7)
HCT: 38.9 % (ref 36.0–46.0)
Hemoglobin: 13.2 g/dL (ref 12.0–15.0)
LYMPHS ABS: 1.1 10*3/uL (ref 0.7–4.0)
Lymphocytes Relative: 24.2 % (ref 12.0–46.0)
MCHC: 34 g/dL (ref 30.0–36.0)
MCV: 94.7 fl (ref 78.0–100.0)
MONOS PCT: 13.8 % — AB (ref 3.0–12.0)
Monocytes Absolute: 0.6 10*3/uL (ref 0.1–1.0)
NEUTROS ABS: 2.8 10*3/uL (ref 1.4–7.7)
NEUTROS PCT: 58.8 % (ref 43.0–77.0)
PLATELETS: 210 10*3/uL (ref 150.0–400.0)
RBC: 4.11 Mil/uL (ref 3.87–5.11)
RDW: 13.2 % (ref 11.5–15.5)
WBC: 4.7 10*3/uL (ref 4.0–10.5)

## 2017-08-23 LAB — SEDIMENTATION RATE: Sed Rate: 9 mm/hr (ref 0–30)

## 2017-08-23 MED ORDER — PREDNISONE 20 MG PO TABS: ORAL_TABLET | ORAL | 0 refills | Status: DC

## 2017-08-23 MED ORDER — METHYLPREDNISOLONE ACETATE 80 MG/ML IJ SUSP
80.0000 mg | Freq: Once | INTRAMUSCULAR | Status: AC
  Administered 2017-08-23: 80 mg via INTRAMUSCULAR

## 2017-08-23 MED ORDER — TRIAMCINOLONE ACETONIDE 0.1 % EX CREA: 1.0000 "application " | TOPICAL_CREAM | Freq: Two times a day (BID) | CUTANEOUS | 0 refills | Status: DC

## 2017-08-23 NOTE — Progress Notes (Signed)
Robin Banks , 1955/10/24, 62 y.o., female MRN: [id number] Patient Care Team    Relationship Specialty Notifications Start End  Ma Hillock, DO PCP - General Family Medicine  10/26/15   Arvella Nigh, MD Consulting Physician Obstetrics and Gynecology  10/28/15   Tanda Rockers, MD Consulting Physician Pulmonary Disease  10/28/15     Chief Complaint  Patient presents with  . scalp pain    sensitive to touch,radiates to ear, random stabbing pain left side only, rash under chin     Subjective: Pt presents for an OV with complaints of scalp pain and rash of 1 week duration.  Patient reports she had a rash that developed over her arms, right breast, chin.  The rash is pruritic.  Around the same time she has noticed left-sided scalp pain that has a occasional stabbing pain that radiates to her ear.  She has also noticed a tender mass located behind her left ear.  She denies any personal hygiene products changes.  She does not believe she was exposed to any plants.  She denies fever, chills, nausea, fatigue or vomit.  Depression screen [facility] 2/9 04/06/2017 01/30/2017 03/24/2016 10/26/2015  Decreased Interest 0 2 3 0  Down, Depressed, Hopeless 0 2 2 0  PHQ - 2 Score 0 4 5 0  Altered sleeping 0 0 0 -  Tired, decreased energy 0 1 3 -  Change in appetite 0 0 0 -  Feeling bad or failure about yourself  0 0 1 -  Trouble concentrating 0 0 0 -  Moving slowly or fidgety/restless 0 0 0 -  Suicidal thoughts 0 0 0 -  PHQ-9 Score 0 5 9 -  Difficult doing work/chores Not difficult at all Somewhat difficult - -    Allergies  Allergen Reactions  . Meperidine Hcl Nausea Only  . Nitrofurantoin Nausea Only   Social History   Tobacco Use  . Smoking status: Never Smoker  . Smokeless tobacco: Never Used  Substance Use Topics  . Alcohol use: No   Past Medical History:  Diagnosis Date  . Allergy   . Asthma   . Chicken pox   . Compression fracture of thoracic spine, non-traumatic (HCC)    T7-8    . Depression   . GERD (gastroesophageal reflux disease)   . Insomnia   . Menopause   . Overactive bladder   . TMJ (temporomandibular joint syndrome)   . Urinary incontinence    Past Surgical History:  Procedure Laterality Date  . BLADDER SUSPENSION  2011   Dr. Matilde Sprang  . CARPAL TUNNEL RELEASE Bilateral 2014  . TUBAL LIGATION Bilateral 1978   Family History  Problem Relation Age of Onset  . Hypertension Father   . CAD Father   . Hearing loss Father   . Heart disease Father   . COPD Father   . Hyperlipidemia Mother   . Arthritis Mother   . Alcohol abuse Paternal Grandfather   . Heart attack Paternal Grandfather   . CAD Paternal Grandfather   . Dementia Paternal Grandmother   . Aortic aneurysm Maternal Grandmother   . Cerebral palsy Son        Mild CP by records  . Kidney nephrosis Son        FSG, kidney transplant.    Allergies as of 08/23/2017      Reactions   Meperidine Hcl Nausea Only   Nitrofurantoin Nausea Only      Medication List  Accurate as of 08/23/17  2:24 PM. Always use your most recent med list.          beclomethasone 40 MCG/ACT inhaler Commonly known as:  QVAR REDIHALER Inhale 1 puff daily into the lungs.   fluticasone 50 MCG/ACT nasal spray Commonly known as:  FLONASE Place 2 sprays daily into both nostrils.   traZODone 150 MG tablet Commonly known as:  DESYREL Take 1 tablet (150 mg total) by mouth at bedtime.   venlafaxine XR 150 MG 24 hr capsule Commonly known as:  EFFEXOR-XR TAKE ONE CAPSULE BY MOUTH EVERY MORNING WITH BREAKFAST       All past medical history, surgical history, allergies, family history, immunizations andmedications were updated in the EMR today and reviewed under the history and medication portions of their EMR.     ROS: Negative, with the exception of above mentioned in HPI   Objective:  BP 126/86 (BP Location: Right Arm, Patient Position: Sitting, Cuff Size: Normal)   Pulse 81   Temp 98 F (36.7  C)   Resp 20   Ht 5\' 1"  (1.549 m)   Wt 120 lb (54.4 kg)   SpO2 99%   BMI 22.67 kg/m  Body mass index is 22.67 kg/m. Gen: Afebrile. No acute distress. Nontoxic in appearance, well developed, well nourished.  Very pleasant Caucasian female. HENT: AT. [place]. Bilateral TM visualized without erythema or fullness. MMM, no oral lesions.  Left posterior auricular tenderness with moderate size lymph node. Eyes:Pupils Equal Round Reactive to light, Extraocular movements intact,  Conjunctiva without redness, discharge or icterus. Neck/lymp/endocrine: Supple, posterior auricular left lymphadenopathy Skin: Flat red/purple rash bilateral forearms and breast with mild ulcerations and some of the areas.  Left-sided scalp with flat red scaly tender rash, no purpura or petechiae.  Neuro: Normal gait. PERLA. EOMi. Alert. Oriented x3   No exam data present No results found. No results found for this or any previous visit (from the past 24 hour(s)).  Assessment/Plan: SIHAAM CHROBAK is a 62 y.o. female present for OV for  Dermatitis/Rash/left postauricular mass -Rash appears consistent with a plant dermatitis such as sumac in her trunk and forearms, but cannot be certain since presentation is atypical, especially on her scalp.  The rash is pruritic except for scalp in which it is tender.  She does have a significant left posterior auricular lymphadenopathy which is also tender.  Do not suspect superinfection of areas at this time.  Gust treatment with her and decided on steroid cream application, prednisone taper to start tomorrow.  IM Depo-Medrol injection today.  We will complete some lab work to investigate further. - triamcinolone cream (KENALOG) 0.1 %; Apply 1 application topically 2 (two) times daily.  Dispense: 30 g; Refill: 0 - predniSONE (DELTASONE) 20 MG tablet; 60 mg x3d, 40 mg x3d, 20 mg x2d, 10 mg x2d  Dispense: 18 tablet; Refill: 0 - CBC w/Diff - Sedimentation rate - methylPREDNISolone acetate  (DEPO-MEDROL) injection 80 mg - follow up in 1-2 weeks if symptoms are not improving, 2 weeks if not resolved, sooner if worsening. May need derm referral or bx.   Reviewed expectations re: course of current medical issues.  Discussed self-management of symptoms.  Outlined signs and symptoms indicating need for more acute intervention.  Patient verbalized understanding and all questions were answered.  Patient received an After-Visit Summary.    No orders of the defined types were placed in this encounter.    Note is dictated utilizing voice recognition software. Although note  has been proof read prior to signing, occasional typographical errors still can be missed. If any questions arise, please do not hesitate to call for verification.   electronically signed by:  Howard Pouch, DO  [place]

## 2017-08-23 NOTE — Patient Instructions (Signed)
steroid cream for areas, other than face. Can use the hydrocortisone for chin.  Steroid taper start tomorrow (orally).   Collection of labs today. If worsens, then I want to see again quickly.

## 2017-08-23 NOTE — Telephone Encounter (Signed)
Pt. Reports last Thursday started having "sensitivity to left side of my head at my ear. Now I have a sharp pain that comes and goes." Reports the sensitivity has moved to the left side of her throat and around the side of her head. No other symptoms. No fever. Appointment made for today with her provider. Reason for Disposition . [1] New headache AND [2] age > 69  Answer Assessment - Initial Assessment Questions 1. LOCATION: "Where does it hurt?"      Left side above ear 2. ONSET: "When did the headache start?" (Minutes, hours or days)      Last Thursday 3. PATTERN: "Does the pain come and go, or has it been constant since it started?"     Comes and goes 4. SEVERITY: "How bad is the pain?" and "What does it keep you from doing?"  (e.g., Scale 1-10; mild, moderate, or severe)   - MILD (1-3): doesn't interfere with normal activities    - MODERATE (4-7): interferes with normal activities or awakens from sleep    - SEVERE (8-10): excruciating pain, unable to do any normal activities        4 5. RECURRENT SYMPTOM: "Have you ever had headaches before?" If so, ask: "When was the last time?" and "What happened that time?"      No 6. CAUSE: "What do you think is causing the headache?"     Unsure 7. MIGRAINE: "Have you been diagnosed with migraine headaches?" If so, ask: "Is this headache similar?"      No 8. HEAD INJURY: "Has there been any recent injury to the head?"      No 9. OTHER SYMPTOMS: "Do you have any other symptoms?" (fever, stiff neck, eye pain, sore throat, cold symptoms)     Sensitivity to left side of throat and face 10. PREGNANCY: "Is there any chance you are pregnant?" "When was your last menstrual period?"       No  Protocols used: HEADACHE-A-AH

## 2017-08-24 ENCOUNTER — Telehealth: Payer: Self-pay | Admitting: Family Medicine

## 2017-08-24 DIAGNOSIS — R591 Generalized enlarged lymph nodes: Secondary | ICD-10-CM

## 2017-08-24 NOTE — Telephone Encounter (Signed)
Spoke with patient reviewed lab results and instructions. Patient verbalized understanding. Patient states she thinks the place behind her ear is improving she will follow up next week if not improving or getting worse , if improving she will follow up after she finishes prednisone.

## 2017-08-24 NOTE — Telephone Encounter (Signed)
Please inform patient the following information: Her labs are stable. She has mild increase in monocytes, but this is not abnormal for her and likely her normal.   If her symptoms are improving and the area behind her ear is resolving, follow up after prednisone completed for recheck. If symptoms are worsening, or not improving I want to perform additional test next week.

## 2017-08-25 ENCOUNTER — Encounter: Payer: Self-pay | Admitting: Family Medicine

## 2017-08-30 ENCOUNTER — Ambulatory Visit: Payer: PRIVATE HEALTH INSURANCE | Admitting: Family Medicine

## 2017-10-25 ENCOUNTER — Encounter: Payer: Self-pay | Admitting: *Deleted

## 2017-10-25 ENCOUNTER — Other Ambulatory Visit: Payer: Self-pay | Admitting: Family Medicine

## 2017-10-25 DIAGNOSIS — F331 Major depressive disorder, recurrent, moderate: Secondary | ICD-10-CM

## 2017-11-22 ENCOUNTER — Encounter: Payer: Self-pay | Admitting: *Deleted

## 2017-11-22 ENCOUNTER — Other Ambulatory Visit: Payer: Self-pay | Admitting: Family Medicine

## 2017-11-22 DIAGNOSIS — F331 Major depressive disorder, recurrent, moderate: Secondary | ICD-10-CM

## 2017-12-04 ENCOUNTER — Ambulatory Visit: Payer: PRIVATE HEALTH INSURANCE | Admitting: Family Medicine

## 2017-12-04 ENCOUNTER — Encounter: Payer: Self-pay | Admitting: Family Medicine

## 2017-12-04 VITALS — BP 117/78 | HR 82 | Temp 97.8°F | Resp 20 | Ht 61.0 in | Wt 119.0 lb

## 2017-12-04 DIAGNOSIS — F5101 Primary insomnia: Secondary | ICD-10-CM | POA: Diagnosis not present

## 2017-12-04 DIAGNOSIS — F418 Other specified anxiety disorders: Secondary | ICD-10-CM | POA: Diagnosis not present

## 2017-12-04 DIAGNOSIS — F331 Major depressive disorder, recurrent, moderate: Secondary | ICD-10-CM | POA: Diagnosis not present

## 2017-12-04 DIAGNOSIS — R7303 Prediabetes: Secondary | ICD-10-CM

## 2017-12-04 DIAGNOSIS — Z23 Encounter for immunization: Secondary | ICD-10-CM

## 2017-12-04 DIAGNOSIS — G4700 Insomnia, unspecified: Secondary | ICD-10-CM

## 2017-12-04 DIAGNOSIS — R7309 Other abnormal glucose: Secondary | ICD-10-CM

## 2017-12-04 DIAGNOSIS — J4540 Moderate persistent asthma, uncomplicated: Secondary | ICD-10-CM

## 2017-12-04 LAB — POCT GLYCOSYLATED HEMOGLOBIN (HGB A1C): Hemoglobin A1C: 5.6 % (ref 4.0–5.6)

## 2017-12-04 MED ORDER — VENLAFAXINE HCL ER 150 MG PO CP24: ORAL_CAPSULE | ORAL | 1 refills | Status: DC

## 2017-12-04 MED ORDER — BECLOMETHASONE DIPROP HFA 40 MCG/ACT IN AERB: 1.0000 | INHALATION_SPRAY | Freq: Every day | RESPIRATORY_TRACT | 5 refills | Status: DC

## 2017-12-04 MED ORDER — TRAZODONE HCL 150 MG PO TABS: 150.0000 mg | ORAL_TABLET | Freq: Every day | ORAL | 1 refills | Status: DC

## 2017-12-04 NOTE — Addendum Note (Signed)
Addended by: Onalee Hua on: 12/04/2017 08:58 AM   Modules accepted: Orders

## 2017-12-04 NOTE — Progress Notes (Signed)
Robin Banks , 25-Mar-1956, 62 y.o., female MRN: [id number] Patient Care Team    Relationship Specialty Notifications Start End  Ma Hillock, DO PCP - General Family Medicine  10/26/15   Arvella Nigh, MD Consulting Physician Obstetrics and Gynecology  10/28/15   Tanda Rockers, MD Consulting Physician Pulmonary Disease  10/28/15     Chief Complaint  Patient presents with  . Depression    Subjective:  Depression:  Doing well on medications. She is still having some difficulties with her 59 year old granddaughter in which she is having stay with her.   Prior note:  Patient states that the last 2 months have been extra difficult for her. She finds herself to be more emotional, decreased energy, overwhelmed. She doesn't enjoy doing any of the needle work or exercising, as she has in the past. She feels resentful for her added responsibilities surrounding caring for a teenager at her age, since her daughter was murdered. She worries a lot especially about finances. Her father passed away this past June 29, 2022. She states she is a [place] and has turn to prayer frequently, but feels lately this just has not been enough. She states she has been on medications in the past, provided to her through her gynecologist. She had been prescribed Pristiq for approximately 2 years after her daughter's death. She states that medication worked well for her, but it was too expensive.  Insomnia: She reports trazodone Is helping, she is finding that sometimes she does have a sleep hangover the next day. She is taking the full 150 mg dose. She is sleeping whether well, and states that it is effective.  Asthma:  She reports needing refills on her Qvar, Flonase today. She feels her asthma is controlled on this regimen. He denies any shortness of breath or wheezing.  Elevated a1c: 03/2017--> a1c was 6.0. Glucose normal at 90. She is walking.   Mood Disorder Screen: negative 04/22/2016  Depression screen [facility] 2/9  12/04/2017 04/06/2017 01/30/2017 03/24/2016 10/26/2015  Decreased Interest 0 0 2 3 0  Down, Depressed, Hopeless 1 0 2 2 0  PHQ - 2 Score 1 0 4 5 0  Altered sleeping 0 0 0 0 -  Tired, decreased energy 2 0 1 3 -  Change in appetite 0 0 0 0 -  Feeling bad or failure about yourself  0 0 0 1 -  Trouble concentrating 0 0 0 0 -  Moving slowly or fidgety/restless 0 0 0 0 -  Suicidal thoughts 0 0 0 0 -  PHQ-9 Score 3 0 5 9 -  Difficult doing work/chores Not difficult at all Not difficult at all Somewhat difficult - -   GAD 7 : Generalized Anxiety Score 04/06/2017 03/24/2016  Nervous, Anxious, on Edge 0 3  Control/stop worrying 0 1  Worry too much - different things 0 1  Trouble relaxing 0 2  Restless 0 0  Easily annoyed or irritable 0 1  Afraid - awful might happen 0 0  Total GAD 7 Score 0 8  Anxiety Difficulty Not difficult at all Somewhat difficult    Allergies  Allergen Reactions  . Meperidine Hcl Nausea Only  . Nitrofurantoin Nausea Only   Social History   Tobacco Use  . Smoking status: Never Smoker  . Smokeless tobacco: Never Used  Substance Use Topics  . Alcohol use: No   Past Medical History:  Diagnosis Date  . Allergy   . Asthma   . Chicken pox   .  Compression fracture of thoracic spine, non-traumatic (HCC)    T7-8  . Depression   . GERD (gastroesophageal reflux disease)   . Insomnia   . Menopause   . Overactive bladder   . TMJ (temporomandibular joint syndrome)   . Urinary incontinence    Past Surgical History:  Procedure Laterality Date  . BLADDER SUSPENSION  2011   Dr. Matilde Sprang  . CARPAL TUNNEL RELEASE Bilateral 2014  . TUBAL LIGATION Bilateral 1978   Family History  Problem Relation Age of Onset  . Hypertension Father   . CAD Father   . Hearing loss Father   . Heart disease Father   . COPD Father   . Hyperlipidemia Mother   . Arthritis Mother   . Alcohol abuse Paternal Grandfather   . Heart attack Paternal Grandfather   . CAD Paternal Grandfather    . Dementia Paternal Grandmother   . Aortic aneurysm Maternal Grandmother   . Cerebral palsy Son        Mild CP by records  . Kidney nephrosis Son        FSG, kidney transplant.    Allergies as of 12/04/2017      Reactions   Meperidine Hcl Nausea Only   Nitrofurantoin Nausea Only      Medication List        Accurate as of 12/04/17  8:28 AM. Always use your most recent med list.          beclomethasone 40 MCG/ACT inhaler Commonly known as:  QVAR Inhale 1 puff daily into the lungs.   fluticasone 50 MCG/ACT nasal spray Commonly known as:  FLONASE Place 2 sprays daily into both nostrils.   traZODone 150 MG tablet Commonly known as:  DESYREL Take 1 tablet (150 mg total) by mouth at bedtime.   triamcinolone cream 0.1 % Commonly known as:  KENALOG Apply 1 application topically 2 (two) times daily.   venlafaxine XR 150 MG 24 hr capsule Commonly known as:  EFFEXOR-XR TAKE ONE CAPSULE BY MOUTH EVERY MORNING WITH BREAKFAST       No results found for this or any previous visit (from the past 23 hour(s)). No results found.   ROS: Negative, with the exception of above mentioned in HPI   Objective:  BP 117/78 (BP Location: Right Arm, Patient Position: Sitting, Cuff Size: Large)   Pulse 82   Temp 97.8 F (36.6 C)   Resp 20   Ht 5\' 1"  (1.549 m)   Wt 119 lb (54 kg)   SpO2 99%   BMI 22.48 kg/m  Body mass index is 22.48 kg/m. Gen: Afebrile. No acute distress. Pleasant caucasian female.  HENT: AT. [place].  MMM.  Eyes:Pupils Equal Round Reactive to light, Extraocular movements intact,  Conjunctiva without redness, discharge or icterus. CV: RRR no murmur, no edema, +2/4 P posterior tibialis pulses Chest: CTAB, no wheeze or crackles Abd: Soft. NTND. BS present. no Masses palpated.  Skin: no rashes, purpura or petechiae.  Neuro:  Normal gait. PERLA. EOMi. Alert. Oriented. x3 Psych: Normal affect, dress and demeanor. Normal speech. Normal thought content and judgment.     Assessment/Plan: Robin Banks is a 62 y.o. female present for acute OV for  Insomnia, unspecified type - Stable. Continue trazodone 150 mg Qhs - traZODone (DESYREL) 150 MG tablet; Take 1 tablet (150 mg total) by mouth at bedtime.  Dispense: 90 tablet; Refill: 1 - Follow-up every 6 months on current issues.  Moderate persistent asthma without complication - stable, refills  on QVAR provided today - continue flonase.   Moderate episode of recurrent major depressive disorder (HCC) - Continue Effexor to 150 mg QD. - [facility] [place] contact information has been provided in the past.  - Follow in 6 months   Elevated A1c:  - better today.  - lost some weight and exercising.  - follow yearly.   Influenza vaccine provided today.    > 25 minutes spent with patient, >50% of time spent face to face counseling patient  electronically signed by:  Howard Pouch, DO  [place]

## 2017-12-04 NOTE — Patient Instructions (Addendum)
It was a pleasure to see you today.  I have refilled your medications for you. Great job on the weight loss and bringing down the A1c to 5.6.    F/U:  6 months, unless you need me sooner.    Please help [place] help you:  We are honored you have chosen [facility] for your Primary Care home. Below you will find basic instructions that you may need to access in the future. Please help [place] help you by reading the instructions, which cover many of the frequent questions we experience.   Prescription refills and request:  -In order to allow more efficient response time, please call your pharmacy for all refills. They will forward the request electronically to [place]. This allows for the quickest possible response. Request left on a nurse line can take longer to refill, since these are checked as time allows between office patients and other phone calls.  - refill request can take up to 3-5 working days to complete.  - If request is sent electronically and request is appropiate, it is usually completed in 1-2 business days.  - all patients will need to be seen routinely for all chronic medical conditions requiring prescription medications (see follow-up below). If you are overdue for follow up on your condition, you will be asked to make an appointment and we will call in enough medication to cover you until your appointment (up to 30 days).  - all controlled substances will require a face to face visit to request/refill.  - if you desire your prescriptions to go through a new pharmacy, and have an active script at original pharmacy, you will need to call your pharmacy and have scripts transferred to new pharmacy. This is completed between the pharmacy locations and not by your provider.    Results: If any images or labs were ordered, it can take up to 1 week to get results depending on the test ordered and the lab/facility running and resulting the test. - Normal or stable results, which do not need  further discussion, may be released to your mychart immediately with attached note to you. A call may not be generated for normal results. Please make certain to sign up for mychart. If you have questions on how to activate your mychart you can call the front office.  - If your results need further discussion, our office will attempt to contact you via phone, and if unable to reach you after 2 attempts, we will release your abnormal result to your mychart with instructions.  - All results will be automatically released in mychart after 1 week.  - Your provider will provide you with explanation and instruction on all relevant material in your results. Please keep in mind, results and labs may appear confusing or abnormal to the untrained eye, but it does not mean they are actually abnormal for you personally. If you have any questions about your results that are not covered, or you desire more detailed explanation than what was provided, you should make an appointment with your provider to do so.   Our office handles many outgoing and incoming calls daily. If we have not contacted you within 1 week about your results, please check your mychart to see if there is a message first and if not, then contact our office.  In helping with this matter, you help decrease call volume, and therefore allow [place] to be able to respond to patients needs more efficiently.   Acute office visits (  sick visit):  An acute visit is intended for a new problem and are scheduled in shorter time slots to allow schedule openings for patients with new problems. This is the appropriate visit to discuss a new problem. Problems will not be addressed by phone call or Echart message. Appointment is needed if requesting treatment. In order to provide you with excellent quality medical care with proper time for you to explain your problem, have an exam and receive treatment with instructions, these appointments should be limited to one new  problem per visit. If you experience a new problem, in which you desire to be addressed, please make an acute office visit, we save openings on the schedule to accommodate you. Please do not save your new problem for any other type of visit, let us take care of it properly and quickly for you.   Follow up visits:  Depending on your condition(s) your provider will need to see you routinely in order to provide you with quality care and prescribe medication(s). Most chronic conditions (Example: hypertension, Diabetes, depression/anxiety... etc), require visits a couple times a year. Your provider will instruct you on proper follow up for your personal medical conditions and history. Please make certain to make follow up appointments for your condition as instructed. Failing to do so could result in lapse in your medication treatment/refills. If you request a refill, and are overdue to be seen on a condition, we will always provide you with a 30 day script (once) to allow you time to schedule.    Medicare wellness (well visit): - we have a wonderful Nurse Maudie Mercury), that will meet with you and provide you will yearly medicare wellness visits. These visits should occur yearly (can not be scheduled less than 1 calendar year apart) and cover preventive health, immunizations, advance directives and screenings you are entitled to yearly through your medicare benefits. Do not miss out on your entitled benefits, this is when medicare will pay for these benefits to be ordered for you.  These are strongly encouraged by your provider and is the appropriate type of visit to make certain you are up to date with all preventive health benefits. If you have not had your medicare wellness exam in the last 12 months, please make certain to schedule one by calling the office and schedule your medicare wellness with Maudie Mercury as soon as possible.   Yearly physical (well visit):  - Adults are recommended to be seen yearly for physicals.  Check with your insurance and date of your last physical, most insurances require one calendar year between physicals. Physicals include all preventive health topics, screenings, medical exam and labs that are appropriate for gender/age and history. You may have fasting labs needed at this visit. This is a well visit (not a sick visit), new problems should not be covered during this visit (see acute visit).  - Pediatric patients are seen more frequently when they are younger. Your provider will advise you on well child visit timing that is appropriate for your their age. - This is not a medicare wellness visit. Medicare wellness exams do not have an exam portion to the visit. Some medicare companies allow for a physical, some do not allow a yearly physical. If your medicare allows a yearly physical you can schedule the medicare wellness with our nurse Maudie Mercury and have your physical with your provider after, on the same day. Please check with insurance for your full benefits.   Late Policy/No Shows:  - all new  patients should arrive 15-30 minutes earlier than appointment to allow [place] time  to  obtain all personal demographics,  insurance information and for you to complete office paperwork. - All established patients should arrive 10-15 minutes earlier than appointment time to update all information and be checked in .  - In our best efforts to run on time, if you are late for your appointment you will be asked to either reschedule or if able, we will work you back into the schedule. There will be a wait time to work you back in the schedule,  depending on availability.  - If you are unable to make it to your appointment as scheduled, please call 24 hours ahead of time to allow [place] to fill the time slot with someone else who needs to be seen. If you do not cancel your appointment ahead of time, you may be charged a no show fee.

## 2018-03-07 ENCOUNTER — Ambulatory Visit: Payer: PRIVATE HEALTH INSURANCE | Admitting: Family Medicine

## 2018-03-07 ENCOUNTER — Encounter: Payer: Self-pay | Admitting: Family Medicine

## 2018-03-07 VITALS — BP 133/87 | HR 83 | Temp 97.5°F | Resp 16 | Ht 61.5 in | Wt 122.0 lb

## 2018-03-07 DIAGNOSIS — N3090 Cystitis, unspecified without hematuria: Secondary | ICD-10-CM | POA: Diagnosis not present

## 2018-03-07 DIAGNOSIS — R3915 Urgency of urination: Secondary | ICD-10-CM

## 2018-03-07 DIAGNOSIS — R350 Frequency of micturition: Secondary | ICD-10-CM | POA: Diagnosis not present

## 2018-03-07 LAB — POCT URINALYSIS DIPSTICK
Bilirubin, UA: NEGATIVE
GLUCOSE UA: NEGATIVE
Ketones, UA: NEGATIVE
Nitrite, UA: NEGATIVE
Protein, UA: NEGATIVE
RBC UA: NEGATIVE
Spec Grav, UA: 1.015 (ref 1.010–1.025)
Urobilinogen, UA: 0.2 E.U./dL
pH, UA: 5.5 (ref 5.0–8.0)

## 2018-03-07 MED ORDER — SULFAMETHOXAZOLE-TRIMETHOPRIM 800-160 MG PO TABS: 1.0000 | ORAL_TABLET | Freq: Two times a day (BID) | ORAL | 0 refills | Status: AC

## 2018-03-07 NOTE — Progress Notes (Signed)
OFFICE VISIT  03/07/2018   CC:  Chief Complaint  Patient presents with  . Urinary Frequency    x1 day   HPI:    Patient is a 62 y.o.  female who has hx of urinary urge/mixed incontinence and has had a bladder suspension procedure with urology in 200[address]--> presents today for increased frequency of urination + urinary urgency that started about 5 hours ago.  Suprapubic pressure.  Incomplete emptying sensation.  NO pain with urination. NO nausea or flank pain.  Urine odor is foul today.  Appearance normal.  Past Medical History:  Diagnosis Date  . Allergy   . Asthma   . Chicken pox   . Compression fracture of thoracic spine, non-traumatic (HCC)    T7-8  . Depression   . GERD (gastroesophageal reflux disease)   . Insomnia   . Menopause   . Overactive bladder   . TMJ (temporomandibular joint syndrome)   . Urinary incontinence     Past Surgical History:  Procedure Laterality Date  . BLADDER SUSPENSION  2011   Dr. Matilde Sprang  . CARPAL TUNNEL RELEASE Bilateral 2014  . TUBAL LIGATION Bilateral 1978    Outpatient Medications Prior to Visit  Medication Sig Dispense Refill  . beclomethasone (QVAR REDIHALER) 40 MCG/ACT inhaler Inhale 1 puff into the lungs daily. 1 Inhaler 5  . fluticasone (FLONASE) 50 MCG/ACT nasal spray Place 2 sprays daily into both nostrils. 16 g 11  . traZODone (DESYREL) 150 MG tablet Take 1 tablet (150 mg total) by mouth at bedtime. 90 tablet 1  . venlafaxine XR (EFFEXOR-XR) 150 MG 24 hr capsule TAKE ONE CAPSULE BY MOUTH EVERY MORNING WITH BREAKFAST 90 capsule 1  . triamcinolone cream (KENALOG) 0.1 % Apply 1 application topically 2 (two) times daily. (Patient not taking: Reported on 03/07/2018) 30 g 0   No facility-administered medications prior to visit.     Allergies  Allergen Reactions  . Demerol [Meperidine Hcl] Nausea And Vomiting  . Meperidine Hcl Nausea Only  . Nitrofurantoin Nausea Only    ROS As per HPI  PE: Blood pressure 133/87, pulse  83, temperature (!) 97.5 F (36.4 C), temperature source Oral, resp. rate 16, height 5' 1.5" (1.562 m), weight 122 lb (55.3 kg), SpO2 100 %. Gen: Alert, well appearing.  Patient is oriented to person, place, time, and situation. AFFECT: pleasant, lucid thought and speech. CV: RRR, no m/r/g.   LUNGS: CTA bilat, nonlabored resps, good aeration in all lung fields. CVA: no TTP EXT: no clubbing or cyanosis.  No  edema.    LABS:    Chemistry      Component Value Date/Time   NA 140 04/06/2017 0906   K 4.5 04/06/2017 0906   CL 103 04/06/2017 0906   CO2 33 (H) 04/06/2017 0906   BUN 14 04/06/2017 0906   CREATININE 0.78 04/06/2017 0906      Component Value Date/Time   CALCIUM 9.3 04/06/2017 0906   ALKPHOS 44 04/06/2017 0906   AST 19 04/06/2017 0906   ALT 16 04/06/2017 0906   BILITOT 0.5 04/06/2017 0906     CC UA today:  small leukocytes, o/w normal.  IMPRESSION AND PLAN:  Acute cystitis w/out hematuria. Sent urine for c/s. Bactrim DS 1 bid x 3d. Signs/symptoms to call or return for were reviewed and pt expressed understanding.  An After Visit Summary was printed and given to the patient.  FOLLOW UP: Return if symptoms worsen or fail to improve.  Signed:  Crissie Sickles, MD  03/07/2018      

## 2018-03-09 ENCOUNTER — Other Ambulatory Visit: Payer: Self-pay | Admitting: Family Medicine

## 2018-03-09 LAB — URINE CULTURE
MICRO NUMBER:: 91484960
SPECIMEN QUALITY: ADEQUATE

## 2018-03-09 MED ORDER — CIPROFLOXACIN HCL 500 MG PO TABS: 500.0000 mg | ORAL_TABLET | Freq: Two times a day (BID) | ORAL | 0 refills | Status: AC

## 2018-03-12 ENCOUNTER — Other Ambulatory Visit: Payer: Self-pay | Admitting: Family Medicine

## 2018-03-12 DIAGNOSIS — J4540 Moderate persistent asthma, uncomplicated: Secondary | ICD-10-CM

## 2018-03-12 MED ORDER — FLUTICASONE PROPIONATE 50 MCG/ACT NA SUSP: 2.0000 | Freq: Every day | NASAL | 11 refills | Status: DC

## 2018-03-12 NOTE — Progress Notes (Signed)
Refilled Flonase per electronic request

## 2018-03-13 ENCOUNTER — Encounter: Payer: Self-pay | Admitting: *Deleted

## 2018-04-09 ENCOUNTER — Encounter: Payer: PRIVATE HEALTH INSURANCE | Admitting: Family Medicine

## 2018-06-04 ENCOUNTER — Encounter: Payer: Self-pay | Admitting: Family Medicine

## 2018-06-04 ENCOUNTER — Ambulatory Visit (INDEPENDENT_AMBULATORY_CARE_PROVIDER_SITE_OTHER): Payer: PRIVATE HEALTH INSURANCE | Admitting: Family Medicine

## 2018-06-04 ENCOUNTER — Ambulatory Visit: Payer: PRIVATE HEALTH INSURANCE | Admitting: Family Medicine

## 2018-06-04 VITALS — BP 115/81 | HR 85 | Temp 97.3°F | Resp 16 | Ht 61.0 in | Wt 122.0 lb

## 2018-06-04 DIAGNOSIS — E785 Hyperlipidemia, unspecified: Secondary | ICD-10-CM | POA: Diagnosis not present

## 2018-06-04 DIAGNOSIS — Z0000 Encounter for general adult medical examination without abnormal findings: Secondary | ICD-10-CM

## 2018-06-04 DIAGNOSIS — R7309 Other abnormal glucose: Secondary | ICD-10-CM | POA: Diagnosis not present

## 2018-06-04 DIAGNOSIS — M2022 Hallux rigidus, left foot: Secondary | ICD-10-CM

## 2018-06-04 DIAGNOSIS — F331 Major depressive disorder, recurrent, moderate: Secondary | ICD-10-CM | POA: Diagnosis not present

## 2018-06-04 DIAGNOSIS — Z79899 Other long term (current) drug therapy: Secondary | ICD-10-CM

## 2018-06-04 DIAGNOSIS — M8580 Other specified disorders of bone density and structure, unspecified site: Secondary | ICD-10-CM

## 2018-06-04 DIAGNOSIS — F5101 Primary insomnia: Secondary | ICD-10-CM | POA: Diagnosis not present

## 2018-06-04 DIAGNOSIS — F418 Other specified anxiety disorders: Secondary | ICD-10-CM

## 2018-06-04 DIAGNOSIS — Z130 Encounter for screening for diseases of the blood and blood-forming organs and certain disorders involving the immune mechanism: Secondary | ICD-10-CM | POA: Diagnosis not present

## 2018-06-04 DIAGNOSIS — Z1239 Encounter for other screening for malignant neoplasm of breast: Secondary | ICD-10-CM | POA: Diagnosis not present

## 2018-06-04 DIAGNOSIS — G4700 Insomnia, unspecified: Secondary | ICD-10-CM

## 2018-06-04 LAB — COMPREHENSIVE METABOLIC PANEL
ALT: 17 U/L (ref 0–35)
AST: 20 U/L (ref 0–37)
Albumin: 4 g/dL (ref 3.5–5.2)
Alkaline Phosphatase: 52 U/L (ref 39–117)
BUN: 14 mg/dL (ref 6–23)
CO2: 32 mEq/L (ref 19–32)
Calcium: 9.6 mg/dL (ref 8.4–10.5)
Chloride: 100 mEq/L (ref 96–112)
Creatinine, Ser: 0.71 mg/dL (ref 0.40–1.20)
GFR: 82.7 mL/min (ref >60.00)
Glucose, Bld: 59 mg/dL — ABNORMAL LOW (ref 70–99)
Potassium: 3.9 mEq/L (ref 3.5–5.1)
Sodium: 137 mEq/L (ref 135–145)
Total Bilirubin: 0.4 mg/dL (ref 0.2–1.2)
Total Protein: 6.3 g/dL (ref 6.0–8.3)

## 2018-06-04 LAB — CBC
HEMATOCRIT: 38.9 % (ref 36.0–46.0)
HEMOGLOBIN: 12.9 g/dL (ref 12.0–15.0)
MCHC: 33 g/dL (ref 30.0–36.0)
MCV: 95.7 fl (ref 78.0–100.0)
PLATELETS: 217 10*3/uL (ref 150.0–400.0)
RBC: 4.06 Mil/uL (ref 3.87–5.11)
RDW: 13.2 % (ref 11.5–15.5)
WBC: 4.1 10*3/uL (ref 4.0–10.5)

## 2018-06-04 LAB — LIPID PANEL
Cholesterol: 210 mg/dL — ABNORMAL HIGH (ref 0–200)
HDL: 79.9 mg/dL (ref >39.00)
LDL Cholesterol: 116 mg/dL — ABNORMAL HIGH (ref 0–99)
NonHDL: 129.82
Total CHOL/HDL Ratio: 3
Triglycerides: 71 mg/dL (ref 0.0–149.0)
VLDL: 14.2 mg/dL (ref 0.0–40.0)

## 2018-06-04 LAB — TSH: TSH: 2.18 u[IU]/mL (ref 0.35–4.50)

## 2018-06-04 LAB — HEMOGLOBIN A1C: Hgb A1c MFr Bld: 5.9 % (ref 4.6–6.5)

## 2018-06-04 MED ORDER — VENLAFAXINE HCL ER 150 MG PO CP24: ORAL_CAPSULE | ORAL | 1 refills | Status: DC

## 2018-06-04 MED ORDER — TRAZODONE HCL 150 MG PO TABS: 150.0000 mg | ORAL_TABLET | Freq: Every day | ORAL | 1 refills | Status: DC

## 2018-06-04 NOTE — Patient Instructions (Signed)

## 2018-06-04 NOTE — Progress Notes (Signed)
Patient ID: Robin Banks, female  DOB: January 31, 1956, 63 y.o.   MRN: [id number] Patient Care Team    Relationship Specialty Notifications Start End  Ma Hillock, DO PCP - General Family Medicine  10/26/15   Arvella Nigh, MD Consulting Physician Obstetrics and Gynecology  10/28/15   Tanda Rockers, MD Consulting Physician Pulmonary Disease  10/28/15     Chief Complaint  Patient presents with  . Annual Exam    Not fasting.     Subjective:  Robin Banks is a 63 y.o.  Female  present for CPE . All past medical history, surgical history, allergies, family history, immunizations, medications and social history were updated in the electronic medical record today. All recent labs, ED visits and hospitalizations within the last year were reviewed. Depression/anxiety/insomnia:  Pt reports she is doing well on current regimen. Still having trouble getting motivated to get out of bed in the morning and exercise .  Left foot acquired deformity:  Pt would like to discuss her left toe deformity. It is currently not painful to her, but her 1 st toe is crossing over 2 nd.   Health maintenance: updated 06/04/18 Colonoscopy: completed: completed about 12 years ago, normal. She is uncertain what doctor/practice completed 2007. She reports her insurance does not cover a preventive colonoscopy- waiting until she is medicare covered.  Mammogram: completed: mammogram and pelvic at GYN (Dr. Leatrice Jewels Comb). SBE routinely. Last mammogram 2017--> ordered at breast center today. Cervical cancer screening:see above, LMP at 47. Has GYN.  Immunizations:waiting on medicare in July.  tdap2010, InfluenzaUTD(encouraged yearly), PNA series2014, zostavax2014 Infectious disease screening:waiting until medicare coverage.  DEXA:2016.-1.9 right femoral neck. Osteopenia rt hip, she takes calcium and vitamin D and weight bearing exercise Assistive device: none Oxygen use: none Patient has a Dental  home. Hospitalizations/ED visits: reviewed   Depression screen [facility] 2/9 06/04/2018 12/04/2017 04/06/2017 01/30/2017 03/24/2016  Decreased Interest 0 0 0 2 3  Down, Depressed, Hopeless 1 1 0 2 2  PHQ - 2 Score 1 1 0 4 5  Altered sleeping 3 0 0 0 0  Tired, decreased energy 1 2 0 1 3  Change in appetite 0 0 0 0 0  Feeling bad or failure about yourself  0 0 0 0 1  Trouble concentrating 0 0 0 0 0  Moving slowly or fidgety/restless 0 0 0 0 0  Suicidal thoughts 0 0 0 0 0  PHQ-9 Score 5 3 0 5 9  Difficult doing work/chores Somewhat difficult Not difficult at all Not difficult at all Somewhat difficult -   GAD 7 : Generalized Anxiety Score 06/04/2018 04/06/2017 03/24/2016  Nervous, Anxious, on Edge 0 0 3  Control/stop worrying 0 0 1  Worry too much - different things 0 0 1  Trouble relaxing 0 0 2  Restless 0 0 0  Easily annoyed or irritable 0 0 1  Afraid - awful might happen 0 0 0  Total GAD 7 Score 0 0 8  Anxiety Difficulty Somewhat difficult Not difficult at all Somewhat difficult     Immunization History  Administered Date(s) Administered  . Influenza,inj,Quad PF,6+ Mos 03/24/2016, 01/24/2017, 12/04/2017  . Influenza-Unspecified 12/26/2013, 01/24/2017  . Pneumococcal Polysaccharide-23 11/20/2012  . Tdap 02/03/2009  . Zoster 03/15/2013     Past Medical History:  Diagnosis Date  . Allergy   . Asthma   . Chicken pox   . Compression fracture of thoracic spine, non-traumatic (HCC)    T7-8  .  Depression   . GERD (gastroesophageal reflux disease)   . Insomnia   . Menopause   . Overactive bladder   . TMJ (temporomandibular joint syndrome)   . Urinary incontinence    Allergies  Allergen Reactions  . Demerol [Meperidine Hcl] Nausea And Vomiting  . Meperidine Hcl Nausea Only  . Nitrofurantoin Nausea Only   Past Surgical History:  Procedure Laterality Date  . BLADDER SUSPENSION  2011   Dr. Matilde Sprang  . CARPAL TUNNEL RELEASE Bilateral 2014  . TUBAL LIGATION Bilateral 1978    Family History  Problem Relation Age of Onset  . Hypertension Father   . CAD Father   . Hearing loss Father   . Heart disease Father   . COPD Father   . Hyperlipidemia Mother   . Arthritis Mother   . Alcohol abuse Paternal Grandfather   . Heart attack Paternal Grandfather   . CAD Paternal Grandfather   . Dementia Paternal Grandmother   . Aortic aneurysm Maternal Grandmother   . Cerebral palsy Son        Mild CP by records  . Kidney nephrosis Son        FSG, kidney transplant.    Social History   Social History Narrative   Married to [place]). Three children [name] and [name].    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    Takes herbal remedies, daily vitamin,    Wears seatbelt   Exercises 3x a week   Smoke detector in the home.    Firearms in the home (locked)   Feels safe in relationship.     Allergies as of 06/04/2018      Reactions   Demerol [meperidine Hcl] Nausea And Vomiting   Meperidine Hcl Nausea Only   Nitrofurantoin Nausea Only      Medication List       Accurate as of June 04, 2018  9:33 AM. Always use your most recent med list.        beclomethasone 40 MCG/ACT inhaler Commonly known as:  Qvar RediHaler Inhale 1 puff into the lungs daily.   fluticasone 50 MCG/ACT nasal spray Commonly known as:  FLONASE Place 2 sprays into both nostrils daily.   traZODone 150 MG tablet Commonly known as:  DESYREL Take 1 tablet (150 mg total) by mouth at bedtime.   venlafaxine XR 150 MG 24 hr capsule Commonly known as:  EFFEXOR-XR TAKE ONE CAPSULE BY MOUTH EVERY MORNING WITH BREAKFAST       All past medical history, surgical history, allergies, family history, immunizations andmedications were updated in the EMR today and reviewed under the history and medication portions of their EMR.      ROS: 14 pt review of systems performed and negative (unless mentioned in an HPI)  Objective: BP 115/81 (BP Location: Right Arm, Patient Position: Sitting, Cuff Size:  Normal)   Pulse 85   Temp (!) 97.3 F (36.3 C) (Oral)   Resp 16   Ht _0  (1.549 m)   Wt 122 lb (55.3 kg)   SpO2 100%   BMI 23.05 kg/m  Gen: Afebrile. No acute distress. Nontoxic in appearance, well-developed, well-nourished,  Pleasant caucasian female.  HENT: AT. [place]. Bilateral TM visualized and normal in appearance, normal external auditory canal. MMM, no oral lesions, adequate dentition. Bilateral nares within normal limits. Throat without erythema, ulcerations or exudates. no Cough on exam, no hoarseness on exam. Eyes:Pupils Equal Round Reactive to light, Extraocular movements intact,  Conjunctiva without redness, discharge or icterus.  Neck/lymp/endocrine: Supple,no lymphadenopathy, no thyromegaly CV: RRR no murmur, no edema, +2/4 P posterior tibialis pulses. no carotid bruits. No JVD. Chest: CTAB, no wheeze, rhonchi or crackles. normal Respiratory effort. good Air movement. Abd: Soft. flat. NTND. BS present. no Masses palpated. No hepatosplenomegaly. No rebound tenderness or guarding. Skin: no rashes, purpura or petechiae. Warm and well-perfused. Skin intact. Neuro/Msk:  Normal gait. PERLA. EOMi. Alert. Oriented x3.  Cranial nerves II through XII intact. Muscle strength 5/5 upper/lower extremity. DTRs equal bilaterally. Left hallux rigidus of 1st metatarsal w/ crossover to 2nd.  Psych: Normal affect, dress and demeanor. Normal speech. Normal thought content and judgment.   No exam data present  Assessment/plan: Robin Banks is a 63 y.o. female present for CPE  Breast cancer screening - MM 3D SCREEN BREAST BILATERAL; Future Osteopenia, unspecified location - DEXA 2017 -1.9 right hip.  - continue calcium and vit d Moderate recurrent major depression (HCC)/Primary insomnia/Depression with anxiety - stable.  - refills provided on effexor and trazodone.  - TSH - f/u 6 mos. Elevated hemoglobin A1c - diet and routine exercise encouraged.  - Comp Met (CMET) - HgB A1c Encounter  for long-term current use of medication - Comp Met (CMET) Screening for deficiency anemia - CBC Hyperlipidemia, unspecified hyperlipidemia type - Lipid panel Hallux Rigidus:  - left foot--> offered podiatry referral she would like to wait, currently not symptomatic.  Encounter for preventive care:  Patient was encouraged to exercise greater than 150 minutes a week. Patient was encouraged to choose a diet filled with fresh fruits and vegetables, and lean meats. AVS provided to patient today for education/recommendation on gender specific health and safety maintenance. Colonoscopy: completed: completed about 12 years ago, normal. She is uncertain what doctor/practice completed 2007. She reports her insurance does not cover a preventive colonoscopy- waiting until she is medicare covered.  Mammogram: completed: mammogram and pelvic at GYN (Dr. Leatrice Jewels Comb). SBE routinely. Last mammogram 2017--> ordered at breast center today. Cervical cancer screening:see above, LMP at 47. Has GYN.  Immunizations:waiting on medicare in July.  tdap2010, InfluenzaUTD(encouraged yearly), PNA series2014, zostavax2014 Infectious disease screening:waiting until medicare coverage.  DEXA:2016.-1.9 right femoral neck. Osteopenia rt hip, she takes calcium and vitamin D and weight bearing exercise  Return in about 6 months (around 12/05/2018) for [facility] .  Also, once medicare--> appt for welcome to medicare.   Electronically signed by: Howard Pouch, DO [place]

## 2018-06-05 ENCOUNTER — Encounter: Payer: Self-pay | Admitting: *Deleted

## 2018-06-08 ENCOUNTER — Other Ambulatory Visit: Payer: Self-pay | Admitting: Family Medicine

## 2018-06-08 DIAGNOSIS — F331 Major depressive disorder, recurrent, moderate: Secondary | ICD-10-CM

## 2018-09-17 ENCOUNTER — Telehealth: Payer: Self-pay | Admitting: Family Medicine

## 2018-09-17 NOTE — Telephone Encounter (Signed)
-----   Message from [facility] sent at 09/13/2018  2:13 PM EDT ----- Regarding: Disputing bill Please call patient regarding bill she received for $105 for looking at her bunion on toe during her annual physical appt with Dr. Raoul Pitch on 06/04/18.  She states that she is not paying bill.   [phone]

## 2018-09-17 NOTE — Telephone Encounter (Signed)
Tried to call patient to explain billing. Since her depression was addressed and medications refilled as well as her bunion looked at, coding supports the additional modifier. I was unable to leave a message.

## 2018-10-25 ENCOUNTER — Encounter: Payer: Self-pay | Admitting: Family Medicine

## 2018-11-09 ENCOUNTER — Encounter: Payer: Self-pay | Admitting: Physician Assistant

## 2018-11-09 ENCOUNTER — Other Ambulatory Visit: Payer: Self-pay

## 2018-11-09 ENCOUNTER — Ambulatory Visit (INDEPENDENT_AMBULATORY_CARE_PROVIDER_SITE_OTHER): Payer: Medicare HMO | Admitting: Physician Assistant

## 2018-11-09 DIAGNOSIS — J45901 Unspecified asthma with (acute) exacerbation: Secondary | ICD-10-CM | POA: Diagnosis not present

## 2018-11-09 MED ORDER — BENZONATATE 100 MG PO CAPS: 100.0000 mg | ORAL_CAPSULE | Freq: Three times a day (TID) | ORAL | 0 refills | Status: DC | PRN

## 2018-11-09 MED ORDER — PREDNISONE 20 MG PO TABS: 40.0000 mg | ORAL_TABLET | Freq: Every day | ORAL | 0 refills | Status: DC

## 2018-11-09 NOTE — Progress Notes (Signed)
   Virtual Visit via Video   I connected with patient on 11/09/18 at  4:00 PM EDT by a video enabled telemedicine application and verified that I am speaking with the correct person using two identifiers.  Location patient: Home Location provider: [facility], Office Persons participating in the virtual visit: Patient, Provider, [place] (Patina Moore)  I discussed the limitations of evaluation and management by telemedicine and the availability of in person appointments. The patient expressed understanding and agreed to proceed.  Subjective:   HPI:   Patient presents via Doxy[employer]Me today c/o 3 weeks of cough that is dry. Notes very little chest congestion Denies ear pressure, ear pain, sinus pain or headache. Does note some mild nasal congestion. Has been using her chronic inhaler which has helped prevent chest tightness but not cough. Denies chest pain. Notes intermittent tightness and wheezing. Denies fever, chills or aches. Denies sick contact. Denies exposure to COVID as she has remained at home, social distancing. Has just started Delsym for cough with only slight improvement. Notes she gets these exacerbations of asthma and bronchospasm 1-2 x year, usually at season change or with humidity.  ROS:   See pertinent positives and negatives per HPI.  Patient Active Problem List   Diagnosis Date Noted  . Hallux rigidus, acquired, left 06/04/2018  . Elevated hemoglobin A1c 04/07/2017  . Moderate recurrent major depression ([place]) 01/30/2017  . Depression with anxiety 04/22/2016  . Postmenopausal symptoms 10/28/2015  . Osteopenia 10/28/2015  . Overactive bladder 10/28/2015  . Insomnia 10/12/2015  . Asthma 01/09/2007    Social History   Tobacco Use  . Smoking status: Never Smoker  . Smokeless tobacco: Never Used  Substance Use Topics  . Alcohol use: No    Current Outpatient Medications:  .  beclomethasone (QVAR REDIHALER) 40 MCG/ACT inhaler, Inhale 1 puff into the lungs  daily., Disp: 1 Inhaler, Rfl: 5 .  traZODone (DESYREL) 150 MG tablet, Take 1 tablet (150 mg total) by mouth at bedtime., Disp: 90 tablet, Rfl: 1 .  fluticasone (FLONASE) 50 MCG/ACT nasal spray, Place 2 sprays into both nostrils daily. (Patient not taking: Reported on 11/09/2018), Disp: 16 g, Rfl: 11 .  venlafaxine XR (EFFEXOR-XR) 150 MG 24 hr capsule, TAKE ONE CAPSULE BY MOUTH EVERY MORNING WITH BREAKFAST (Patient not taking: Reported on 11/09/2018), Disp: 90 capsule, Rfl: 1  Allergies  Allergen Reactions  . Demerol [Meperidine Hcl] Nausea And Vomiting  . Meperidine Hcl Nausea Only  . Nitrofurantoin Nausea Only    Objective:   There were no vitals taken for this visit.  Patient is well-developed, well-nourished in no acute distress.  Resting comfortably at home.  Head is normocephalic, atraumatic.  No labored breathing.  Speech is clear and coherent with logical content.  Patient is alert and oriented at baseline.   Assessment and Plan:   1. Exacerbation of persistent asthma, unspecified asthma severity Mild exacerbation but prolonged. Lower concern for COVID due to home isolation and lack of known exposures. Continue chronic regimen. Continue Mucinex OTC. Start prednisone burst -- 40 mg x 5 days. Discussed alternatives to this but patient adamant on the steroid as "this is what I always have to have". Rx Tessalon for cough. Follow-up with PCP if symptoms are not improving.     Leeanne Rio, PA-C 11/09/2018

## 2018-11-09 NOTE — Patient Instructions (Signed)
Please keep hydrated and get plenty of rest. Continue chronic medication regimen. Start the Prednisone burst given. Continue Mucinex. The Tessalon will help with cough.  Let us know if symptoms are not improving.   Hang in there!

## 2018-11-09 NOTE — Progress Notes (Signed)
I have discussed the procedure for the virtual visit with the patient who has given consent to proceed with assessment and treatment.   Samad Thon S Nasiah Polinsky, CMA     

## 2018-11-16 DIAGNOSIS — D225 Melanocytic nevi of trunk: Secondary | ICD-10-CM | POA: Diagnosis not present

## 2018-11-16 DIAGNOSIS — D1801 Hemangioma of skin and subcutaneous tissue: Secondary | ICD-10-CM | POA: Diagnosis not present

## 2018-11-16 DIAGNOSIS — L821 Other seborrheic keratosis: Secondary | ICD-10-CM | POA: Diagnosis not present

## 2018-11-19 ENCOUNTER — Encounter: Payer: Self-pay | Admitting: Family Medicine

## 2018-11-19 ENCOUNTER — Encounter: Payer: Self-pay | Admitting: Physician Assistant

## 2018-11-19 NOTE — Telephone Encounter (Signed)
Called patient and explained that calling her insurance company will be best because they have a list of medications covered by her plan that are similar in class. I advised once she calls them and they give her options to give [place] a call and I will send it to Dr. Raoul Pitch to read over and see which one best suits her needs. Patient understood.

## 2018-11-19 NOTE — Telephone Encounter (Signed)
Patient would need to follow-up with her PCP if symptoms resolved and are recurring now.

## 2018-12-05 ENCOUNTER — Ambulatory Visit (INDEPENDENT_AMBULATORY_CARE_PROVIDER_SITE_OTHER): Payer: Medicare HMO | Admitting: Family Medicine

## 2018-12-05 ENCOUNTER — Other Ambulatory Visit: Payer: Self-pay

## 2018-12-05 ENCOUNTER — Encounter: Payer: Self-pay | Admitting: Family Medicine

## 2018-12-05 VITALS — BP 99/66 | HR 89 | Temp 97.3°F | Resp 16 | Ht 61.0 in

## 2018-12-05 DIAGNOSIS — F5101 Primary insomnia: Secondary | ICD-10-CM | POA: Diagnosis not present

## 2018-12-05 DIAGNOSIS — Z23 Encounter for immunization: Secondary | ICD-10-CM | POA: Diagnosis not present

## 2018-12-05 DIAGNOSIS — F418 Other specified anxiety disorders: Secondary | ICD-10-CM | POA: Diagnosis not present

## 2018-12-05 DIAGNOSIS — J4540 Moderate persistent asthma, uncomplicated: Secondary | ICD-10-CM

## 2018-12-05 DIAGNOSIS — R69 Illness, unspecified: Secondary | ICD-10-CM | POA: Diagnosis not present

## 2018-12-05 DIAGNOSIS — G4700 Insomnia, unspecified: Secondary | ICD-10-CM | POA: Diagnosis not present

## 2018-12-05 MED ORDER — TRAZODONE HCL 150 MG PO TABS: 150.0000 mg | ORAL_TABLET | Freq: Every day | ORAL | 1 refills | Status: DC

## 2018-12-05 MED ORDER — FLUTICASONE PROPIONATE 50 MCG/ACT NA SUSP: 2.0000 | Freq: Every day | NASAL | 11 refills | Status: DC

## 2018-12-05 MED ORDER — QVAR REDIHALER 40 MCG/ACT IN AERB: 1.0000 | INHALATION_SPRAY | Freq: Every day | RESPIRATORY_TRACT | 11 refills | Status: DC

## 2018-12-05 MED ORDER — ALBUTEROL SULFATE HFA 108 (90 BASE) MCG/ACT IN AERS: 2.0000 | INHALATION_SPRAY | Freq: Four times a day (QID) | RESPIRATORY_TRACT | 5 refills | Status: DC | PRN

## 2018-12-05 NOTE — Progress Notes (Signed)
Robin Banks , 16-Oct-1955, 63 y.o., female MRN: [id number] Patient Care Team    Relationship Specialty Notifications Start End  Ma Hillock, DO PCP - General Family Medicine  10/26/15   Arvella Nigh, MD Consulting Physician Obstetrics and Gynecology  10/28/15   Tanda Rockers, MD Consulting Physician Pulmonary Disease  10/28/15     Chief Complaint  Patient presents with  . Anxiety    Pt is struggling with son and him being ill. She has stopped anti depressants.   . Depression  . Insomnia    Subjective:  Depression:  She is having difficulties with her family. Her grand daughter has moved out since we last saw each other and has returned to using drugs. Her son is 88 and has had kidneys disease requiring donor kidney 20 years ago. He now has cancer in his donor kidney. He mentally delayed and she has had a difficult time with the burden of knowing about his disease but not able to share with him and help him understand. She reports she is going to keep on living a fun life with him as much as she can. She prays and speaks to friends and family about her troubles.  She stopped the effexor a few months ago. She did not feel it was helping her much any more.   Prior note:  Patient states that the last 2 months have been extra difficult for her. She finds herself to be more emotional, decreased energy, overwhelmed. She doesn't enjoy doing any of the needle work or exercising, as she has in the past. She feels resentful for her added responsibilities surrounding caring for a teenager at her age, since her daughter was murdered. She worries a lot especially about finances. Her father passed away this past Jul 19, 2022. She states she is a [place] and has turn to prayer frequently, but feels lately this just has not been enough. She states she has been on medications in the past, provided to her through her gynecologist. She had been prescribed Pristiq for approximately 2 years after her daughter's  death. She states that medication worked well for her, but it was too expensive.  Insomnia: She reports trazodone Is helping, she is finding that sometimes she does have a sleep hangover the next day. She is taking the full 150 mg dose. No side effects.   Asthma/cough:  She feels her asthma is controlled on this regimen. He denies any shortness of breath or wheezing. She has had a cough for 4 weeks. She was treated with prednisone burst at that time by another provider for an asthma flare. She states she feels better but the cough remains.    Mood Disorder Screen: negative 04/22/2016  Depression screen [facility] 2/9 12/05/2018 06/04/2018 12/04/2017 04/06/2017 01/30/2017  Decreased Interest 0 0 0 0 2  Down, Depressed, Hopeless 1 1 1  0 2  PHQ - 2 Score 1 1 1  0 4  Altered sleeping 0 3 0 0 0  Tired, decreased energy 0 1 2 0 1  Change in appetite 0 0 0 0 0  Feeling bad or failure about yourself  0 0 0 0 0  Trouble concentrating 0 0 0 0 0  Moving slowly or fidgety/restless 0 0 0 0 0  Suicidal thoughts 0 0 0 0 0  PHQ-9 Score 1 5 3  0 5  Difficult doing work/chores Not difficult at all Somewhat difficult Not difficult at all Not difficult at all Somewhat difficult   GAD  7 : Generalized Anxiety Score 12/05/2018 06/04/2018 04/06/2017 03/24/2016  Nervous, Anxious, on Edge 0 0 0 3  Control/stop worrying 0 0 0 1  Worry too much - different things 0 0 0 1  Trouble relaxing 1 0 0 2  Restless 0 0 0 0  Easily annoyed or irritable 0 0 0 1  Afraid - awful might happen 0 0 0 0  Total GAD 7 Score 1 0 0 8  Anxiety Difficulty Not difficult at all Somewhat difficult Not difficult at all Somewhat difficult    Allergies  Allergen Reactions  . Demerol [Meperidine Hcl] Nausea And Vomiting  . Meperidine Hcl Nausea Only  . Nitrofurantoin Nausea Only   Social History   Tobacco Use  . Smoking status: Never Smoker  . Smokeless tobacco: Never Used  Substance Use Topics  . Alcohol use: No   Past Medical History:   Diagnosis Date  . Allergy   . Asthma   . Chicken pox   . Compression fracture of thoracic spine, non-traumatic (HCC)    T7-8  . Depression   . GERD (gastroesophageal reflux disease)   . Insomnia   . Menopause   . Overactive bladder   . TMJ (temporomandibular joint syndrome)   . Urinary incontinence    Past Surgical History:  Procedure Laterality Date  . BLADDER SUSPENSION  2011   Dr. Matilde Sprang  . CARPAL TUNNEL RELEASE Bilateral 2014  . TUBAL LIGATION Bilateral 1978   Family History  Problem Relation Age of Onset  . Hypertension Father   . CAD Father   . Hearing loss Father   . Heart disease Father   . COPD Father   . Hyperlipidemia Mother   . Arthritis Mother   . Alcohol abuse Paternal Grandfather   . Heart attack Paternal Grandfather   . CAD Paternal Grandfather   . Dementia Paternal Grandmother   . Aortic aneurysm Maternal Grandmother   . Cerebral palsy Son        Mild CP by records  . Kidney nephrosis Son        FSG, kidney transplant.    Allergies as of 12/05/2018      Reactions   Demerol [meperidine Hcl] Nausea And Vomiting   Meperidine Hcl Nausea Only   Nitrofurantoin Nausea Only      Medication List       Accurate as of December 05, 2018  8:23 AM. If you have any questions, ask your nurse or doctor.        STOP taking these medications   benzonatate 100 MG capsule Commonly known as: TESSALON Stopped by: Howard Pouch, DO   predniSONE 20 MG tablet Commonly known as: DELTASONE Stopped by: Howard Pouch, DO     TAKE these medications   beclomethasone 40 MCG/ACT inhaler Commonly known as: Qvar RediHaler Inhale 1 puff into the lungs daily.   fluticasone 50 MCG/ACT nasal spray Commonly known as: FLONASE Place 2 sprays into both nostrils daily.   traZODone 150 MG tablet Commonly known as: DESYREL Take 1 tablet (150 mg total) by mouth at bedtime.   venlafaxine XR 150 MG 24 hr capsule Commonly known as: EFFEXOR-XR TAKE ONE CAPSULE BY MOUTH  EVERY MORNING WITH BREAKFAST       No results found for this or any previous visit (from the past 51 hour(s)). No results found.   ROS: Negative, with the exception of above mentioned in HPI   Objective:  BP 99/66 (BP Location: Left Arm, Patient Position: Sitting,  Cuff Size: Normal)   Pulse 89   Temp (!) 97.3 F (36.3 C) (Temporal)   Resp 16   Ht 5\' 1"  (1.549 m)   SpO2 97%   BMI 23.05 kg/m  Body mass index is 23.05 kg/m. Gen: Afebrile. No acute distress.  HENT: AT. [place]. Eyes:Pupils Equal Round Reactive to light, Extraocular movements intact,  Conjunctiva without redness, discharge or icterus. CV: RRR Chest: CTAB. No wheezing or shortness of breath. No cough on exam.  Skin: no rashes, purpura or petechiae.  Neuro:  Normal gait. PERLA. EOMi. Alert. Oriented x3 Psych: Normal affect, dress and demeanor. Normal speech. Normal thought content and judgment.   Assessment/Plan: Robin Banks is a 63 y.o. female present for acute OV for  Insomnia, unspecified type - stable. Continue trazodone 150 mg Qhs - traZODone (DESYREL) 150 MG tablet; Take 1 tablet (150 mg total) by mouth at bedtime.  Dispense: 90 tablet; Refill: 1 - Follow-up every 6 months on current issues.  Moderate persistent asthma without complication/cough - Asthma Stable, refills on QVAR provided today - continue flonase.  - for cough- restart mucinex DM. Take QVAR as directed. Prescribed albuterol inhaler to use with instructions.  reassured she did not appear infectious today and lungs are clear. Cough can take up to 6-8 weeks to resolve.  - start OTC oral antihistamine.  - f/u 2-3 weeks if cough remains.   Moderate episode of recurrent major depressive disorder (HCC) - Pt declines medication restart, but is agreeable to counseling referral. Placed for her today - [facility] [place] contact information has been provided in the past.  - Follow in 6 months    Flu shot provided today.    > 25  minutes spent with patient, >50% of time spent face to face counseling patient  electronically signed by:  Howard Pouch, DO  [place]

## 2018-12-05 NOTE — Patient Instructions (Signed)
Restart mucinex DM.  Take QVAR as prescribed.   Use albuterol inhaler up to every 6 hours.  Start OTC antihistamine as well.   Give this another 2-3 weeks. If still having problems with cough then followup.   I will refer you to a therapist .They will call to schedule you.   Follow up 6 months sooner if needed.

## 2018-12-08 ENCOUNTER — Other Ambulatory Visit: Payer: Self-pay | Admitting: Family Medicine

## 2018-12-08 DIAGNOSIS — F331 Major depressive disorder, recurrent, moderate: Secondary | ICD-10-CM

## 2019-01-29 ENCOUNTER — Ambulatory Visit: Payer: Medicare HMO | Admitting: Psychology

## 2019-02-07 ENCOUNTER — Other Ambulatory Visit: Payer: Self-pay

## 2019-02-07 ENCOUNTER — Encounter: Payer: Self-pay | Admitting: Family Medicine

## 2019-02-07 ENCOUNTER — Ambulatory Visit (INDEPENDENT_AMBULATORY_CARE_PROVIDER_SITE_OTHER): Payer: Medicare HMO | Admitting: Family Medicine

## 2019-02-07 VITALS — Wt 122.0 lb

## 2019-02-07 DIAGNOSIS — J4521 Mild intermittent asthma with (acute) exacerbation: Secondary | ICD-10-CM

## 2019-02-07 MED ORDER — AZITHROMYCIN 250 MG PO TABS: ORAL_TABLET | ORAL | 0 refills | Status: DC

## 2019-02-07 MED ORDER — QVAR REDIHALER 40 MCG/ACT IN AERB: 2.0000 | INHALATION_SPRAY | Freq: Two times a day (BID) | RESPIRATORY_TRACT | 11 refills | Status: DC

## 2019-02-07 MED ORDER — ALBUTEROL SULFATE HFA 108 (90 BASE) MCG/ACT IN AERS: 2.0000 | INHALATION_SPRAY | Freq: Four times a day (QID) | RESPIRATORY_TRACT | 5 refills | Status: DC | PRN

## 2019-02-07 MED ORDER — PREDNISONE 10 MG PO TABS: ORAL_TABLET | ORAL | 0 refills | Status: DC

## 2019-02-07 NOTE — Progress Notes (Signed)
VIRTUAL VISIT VIA VIDEO  I connected with Robin Banks on 02/07/19 at 11:30 AM EST by a video enabled telemedicine application and verified that I am speaking with the correct person using two identifiers. Location patient: Home Location provider: [facility], Office Persons participating in the virtual visit: Patient, Dr. Raoul Pitch and R.Baker, LPN  I discussed the limitations of evaluation and management by telemedicine and the availability of in person appointments. The patient expressed understanding and agreed to proceed.   SUBJECTIVE Chief Complaint  Patient presents with  . Cough    since the summer  . Facial Pain    x5 days. a lot drainage  . Wheezing    this morning and she is using her inhaler    HPI: Robin Banks is a 63 y.o. female present today via virtual video with nasal congestion, postnasal drainage, productive green mucus feeling a light chest tightness and wheezing.  She has been using Mucinex, her inhalers and her albuterol BID and nasal saline solution.  She reports she has been using the Qvar 2 puffs once a day.  She denies fever, chills, nausea, vomit, diarrhea, headache.  She has not been exposed to known illnesses or COVID-19.  ROS: See pertinent positives and negatives per HPI.  Patient Active Problem List   Diagnosis Date Noted  . Hallux rigidus, acquired, left 06/04/2018  . Elevated hemoglobin A1c 04/07/2017  . Moderate recurrent major depression ([place]) 01/30/2017  . Depression with anxiety 04/22/2016  . Postmenopausal symptoms 10/28/2015  . Osteopenia 10/28/2015  . Overactive bladder 10/28/2015  . Insomnia 10/12/2015  . Asthma 01/09/2007    Social History   Tobacco Use  . Smoking status: Never Smoker  . Smokeless tobacco: Never Used  Substance Use Topics  . Alcohol use: No    Current Outpatient Medications:  .  albuterol (VENTOLIN HFA) 108 (90 Base) MCG/ACT inhaler, Inhale 2 puffs into the lungs every 6 (six) hours as needed for  wheezing or shortness of breath., Disp: 8 g, Rfl: 5 .  beclomethasone (QVAR REDIHALER) 40 MCG/ACT inhaler, Inhale 1 puff into the lungs daily., Disp: 11 g, Rfl: 11 .  fluticasone (FLONASE) 50 MCG/ACT nasal spray, Place 2 sprays into both nostrils daily., Disp: 16 g, Rfl: 11 .  traZODone (DESYREL) 150 MG tablet, Take 1 tablet (150 mg total) by mouth at bedtime., Disp: 90 tablet, Rfl: 1  Allergies  Allergen Reactions  . Demerol [Meperidine Hcl] Nausea And Vomiting  . Meperidine Hcl Nausea Only  . Nitrofurantoin Nausea Only    OBJECTIVE: Wt 122 lb (55.3 kg)   BMI 23.05 kg/m  Gen: No acute distress. Nontoxic in appearance.  HENT: AT. [place].  MMM.  Eyes:Pupils Equal Round Reactive to light, Extraocular movements intact,  Conjunctiva without redness, discharge or icterus. Chest: Cough or shortness of breath not present on exam Skin: No rashes, purpura or petechiae.  Neuro:  Normal gait. Alert. Oriented x3  Psych: Normal affect, dress and demeanor. Normal speech. Normal thought content and judgment.  ASSESSMENT AND PLAN: Robin Banks is a 63 y.o. female present for  Mild intermittent asthmatic bronchitis with acute exacerbation Rest, hydrate. Short taper of prednisone for 5 days only secondary to prior anxiety/irritation reaction to higher dose 5-day course. Continue Flonase, allergy medicine and albuterol 1 to 2 puffs as needed with illness. Increase Qvar to 2 puffs 2 times a day. Z-Pak prescribed. Follow-up 2-4 weeks if not seeing improvement, sooner if worsening.    > 25  minutes spent with patient, >50% of time spent face to face     Howard Pouch, DO 02/07/2019

## 2019-02-11 ENCOUNTER — Encounter: Payer: Self-pay | Admitting: Family Medicine

## 2019-02-11 NOTE — Patient Instructions (Signed)
Asthma, Adult ° °Asthma is a long-term (chronic) condition in which the airways get tight and narrow. The airways are the breathing passages that lead from the nose and mouth down into the lungs. A person with asthma will have times when symptoms get worse. These are called asthma attacks. They can cause coughing, whistling sounds when you breathe (wheezing), shortness of breath, and chest pain. They can make it hard to breathe. There is no cure for asthma, but medicines and lifestyle changes can help control it. °There are many things that can bring on an asthma attack or make asthma symptoms worse (triggers). Common triggers include: °· Mold. °· Dust. °· Cigarette smoke. °· Cockroaches. °· Things that can cause allergy symptoms (allergens). These include animal skin flakes (dander) and pollen from trees or grass. °· Things that pollute the air. These may include household cleaners, wood smoke, smog, or chemical odors. °· Cold air, weather changes, and wind. °· Crying or laughing hard. °· Stress. °· Certain medicines or drugs. °· Certain foods such as dried fruit, potato chips, and grape juice. °· Infections, such as a cold or the flu. °· Certain medical conditions or diseases. °· Exercise or tiring activities. °Asthma may be treated with medicines and by staying away from the things that cause asthma attacks. Types of medicines may include: °· Controller medicines. These help prevent asthma symptoms. They are usually taken every day. °· Fast-acting reliever or rescue medicines. These quickly relieve asthma symptoms. They are used as needed and provide short-term relief. °· Allergy medicines if your attacks are brought on by allergens. °· Medicines to help control the body's defense (immune) system. °Follow these instructions at home: °Avoiding triggers in your home °· Change your heating and air conditioning filter often. °· Limit your use of fireplaces and wood stoves. °· Get rid of pests (such as roaches and  mice) and their droppings. °· Throw away plants if you see mold on them. °· Clean your floors. Dust regularly. Use cleaning products that do not smell. °· Have someone vacuum when you are not home. Use a vacuum cleaner with a HEPA filter if possible. °· Replace carpet with wood, tile, or vinyl flooring. Carpet can trap animal skin flakes and dust. °· Use allergy-proof pillows, mattress covers, and box spring covers. °· Wash bed sheets and blankets every week in hot water. Dry them in a dryer. °· Keep your bedroom free of any triggers. °· Avoid pets and keep windows closed when things that cause allergy symptoms are in the air. °· Use blankets that are made of polyester or cotton. °· Clean bathrooms and kitchens with bleach. If possible, have someone repaint the walls in these rooms with mold-resistant paint. Keep out of the rooms that are being cleaned and painted. °· Wash your hands often with soap and water. If soap and water are not available, use hand sanitizer. °· Do not allow anyone to smoke in your home. °General instructions °· Take over-the-counter and prescription medicines only as told by your doctor. °? Talk with your doctor if you have questions about how or when to take your medicines. °? Make note if you need to use your medicines more often than usual. °· Do not use any products that contain nicotine or tobacco, such as cigarettes and e-cigarettes. If you need help quitting, ask your doctor. °· Stay away from secondhand smoke. °· Avoid doing things outdoors when allergen counts are high and when air quality is low. °· Wear a ski mask   when doing outdoor activities in the winter. The mask should cover your nose and mouth. Exercise indoors on cold days if you can. °· Warm up before you exercise. Take time to cool down after exercise. °· Use a peak flow meter as told by your doctor. A peak flow meter is a tool that measures how well the lungs are working. °· Keep track of the peak flow meter's readings.  Write them down. °· Follow your asthma action plan. This is a written plan for taking care of your asthma and treating your attacks. °· Make sure you get all the shots (vaccines) that your doctor recommends. Ask your doctor about a flu shot and a pneumonia shot. °· Keep all follow-up visits as told by your doctor. This is important. °Contact a doctor if: °· You have wheezing, shortness of breath, or a cough even while taking medicine to prevent attacks. °· The mucus you cough up (sputum) is thicker than usual. °· The mucus you cough up changes from clear or white to yellow, green, gray, or bloody. °· You have problems from the medicine you are taking, such as: °? A rash. °? Itching. °? Swelling. °? Trouble breathing. °· You need reliever medicines more than 2-3 times a week. °· Your peak flow reading is still at 50-79% of your personal best after following the action plan for 1 hour. °· You have a fever. °Get help right away if: °· You seem to be worse and are not responding to medicine during an asthma attack. °· You are short of breath even at rest. °· You get short of breath when doing very little activity. °· You have trouble eating, drinking, or talking. °· You have chest pain or tightness. °· You have a fast heartbeat. °· Your lips or fingernails start to turn blue. °· You are light-headed or dizzy, or you faint. °· Your peak flow is less than 50% of your personal best. °· You feel too tired to breathe normally. °Summary °· Asthma is a long-term (chronic) condition in which the airways get tight and narrow. An asthma attack can make it hard to breathe. °· Asthma cannot be cured, but medicines and lifestyle changes can help control it. °· Make sure you understand how to avoid triggers and how and when to use your medicines. °This information is not intended to replace advice given to you by your health care provider. Make sure you discuss any questions you have with your health care provider. °Document  Released: 08/31/2007 Document Revised: 05/17/2018 Document Reviewed: 04/18/2016 °Elsevier Patient Education © 2020 [employer] Inc[employer] ° °

## 2019-04-10 DIAGNOSIS — N952 Postmenopausal atrophic vaginitis: Secondary | ICD-10-CM | POA: Diagnosis not present

## 2019-04-10 DIAGNOSIS — Z6823 Body mass index (BMI) 23.0-23.9, adult: Secondary | ICD-10-CM | POA: Diagnosis not present

## 2019-04-10 DIAGNOSIS — R69 Illness, unspecified: Secondary | ICD-10-CM | POA: Diagnosis not present

## 2019-04-10 DIAGNOSIS — Z1231 Encounter for screening mammogram for malignant neoplasm of breast: Secondary | ICD-10-CM | POA: Diagnosis not present

## 2019-04-10 DIAGNOSIS — Z01419 Encounter for gynecological examination (general) (routine) without abnormal findings: Secondary | ICD-10-CM | POA: Diagnosis not present

## 2019-04-10 DIAGNOSIS — N958 Other specified menopausal and perimenopausal disorders: Secondary | ICD-10-CM | POA: Diagnosis not present

## 2019-04-10 DIAGNOSIS — M8588 Other specified disorders of bone density and structure, other site: Secondary | ICD-10-CM | POA: Diagnosis not present

## 2019-04-11 ENCOUNTER — Encounter: Payer: Self-pay | Admitting: Gastroenterology

## 2019-05-08 ENCOUNTER — Ambulatory Visit (AMBULATORY_SURGERY_CENTER): Payer: Self-pay | Admitting: *Deleted

## 2019-05-08 ENCOUNTER — Other Ambulatory Visit: Payer: Self-pay

## 2019-05-08 VITALS — Temp 96.4°F | Ht 61.5 in | Wt 127.8 lb

## 2019-05-08 DIAGNOSIS — Z01818 Encounter for other preprocedural examination: Secondary | ICD-10-CM

## 2019-05-08 DIAGNOSIS — Z1211 Encounter for screening for malignant neoplasm of colon: Secondary | ICD-10-CM

## 2019-05-08 MED ORDER — SUTAB 1479-225-188 MG PO TABS: 1.0000 | ORAL_TABLET | Freq: Once | ORAL | 0 refills | Status: AC

## 2019-05-08 NOTE — Progress Notes (Signed)
See note- ok to use Sutabs Danis, Kirke Corin, MD  Laverna Peace, RN  Yes, but needs to be sure and drink all the required water, or more if possible   - HD       Previous Messages   ----- Message -----  From: Laverna Peace, RN  Sent: 05/08/2019  4:00 PM EST  To: Doran Stabler, MD   Dr. Loletha Carrow,   This pt is is here for a PV. She states that she has a hard time with drinking the prep- she had her last one 15 years ago and had a rough time. She has no issues with constipation, moves her bowels regularly. She is requesting pills. Is it ok to use the Sutabs with her?   Thanks,  Robin Banks      Pt repeatedly told to push fluids and understanding voiced.  Pt is aware that care partner will wait in the car during procedure; if they feel like they will be too hot or cold to wait in the car; they may wait in the 4 th floor lobby. Patient is aware to bring only one care partner. We want them to wear a mask (we do not have any that we can provide them), practice social distancing, and we will check their temperatures when they get here.  I did remind the patient that their care partner needs to stay in the parking lot the entire time and have a cell phone available, we will call them when the pt is ready for discharge. Patient will wear mask into building.  No egg or soy allergy  No home oxygen use or problems with anesthesia  No medications for weight loss taken  covid test 05-20-19 at 3:20

## 2019-05-13 DIAGNOSIS — R69 Illness, unspecified: Secondary | ICD-10-CM | POA: Diagnosis not present

## 2019-05-14 ENCOUNTER — Encounter: Payer: Self-pay | Admitting: Gastroenterology

## 2019-05-20 ENCOUNTER — Other Ambulatory Visit: Payer: Self-pay

## 2019-05-20 ENCOUNTER — Other Ambulatory Visit: Payer: Self-pay | Admitting: Gastroenterology

## 2019-05-20 ENCOUNTER — Ambulatory Visit (INDEPENDENT_AMBULATORY_CARE_PROVIDER_SITE_OTHER): Payer: Medicare HMO

## 2019-05-20 DIAGNOSIS — Z1159 Encounter for screening for other viral diseases: Secondary | ICD-10-CM

## 2019-05-21 LAB — SARS CORONAVIRUS 2 (TAT 6-24 HRS): SARS Coronavirus 2: NEGATIVE

## 2019-05-23 ENCOUNTER — Other Ambulatory Visit: Payer: Self-pay

## 2019-05-23 ENCOUNTER — Ambulatory Visit (AMBULATORY_SURGERY_CENTER): Payer: Medicare HMO | Admitting: Gastroenterology

## 2019-05-23 ENCOUNTER — Encounter: Payer: Self-pay | Admitting: Gastroenterology

## 2019-05-23 VITALS — BP 90/51 | HR 73 | Temp 97.1°F | Resp 14 | Ht 61.5 in | Wt 127.8 lb

## 2019-05-23 DIAGNOSIS — Z1211 Encounter for screening for malignant neoplasm of colon: Secondary | ICD-10-CM

## 2019-05-23 MED ORDER — SODIUM CHLORIDE 0.9 % IV SOLN: 500.0000 mL | Freq: Once | INTRAVENOUS | Status: DC

## 2019-05-23 NOTE — Progress Notes (Signed)
Pt's states no medical or surgical changes since previsit or office visit. 

## 2019-05-23 NOTE — Progress Notes (Signed)
PT taken to PACU. Monitors in place. VSS. Report given to RN. 

## 2019-05-23 NOTE — Patient Instructions (Signed)
Handouts given: Diverticulosis Resume previous diet Continue current medications Repeat colonoscopy in 10 years    YOU HAD AN ENDOSCOPIC PROCEDURE TODAY AT [facility]:   Refer to the procedure report that was given to you for any specific questions about what was found during the examination.  If the procedure report does not answer your questions, please call your gastroenterologist to clarify.  If you requested that your care partner not be given the details of your procedure findings, then the procedure report has been included in a sealed envelope for you to review at your convenience later.  YOU SHOULD EXPECT: Some feelings of bloating in the abdomen. Passage of more gas than usual.  Walking can help get rid of the air that was put into your GI tract during the procedure and reduce the bloating. If you had a lower endoscopy (such as a colonoscopy or flexible sigmoidoscopy) you may notice spotting of blood in your stool or on the toilet paper. If you underwent a bowel prep for your procedure, you may not have a normal bowel movement for a few days.  Please Note:  You might notice some irritation and congestion in your nose or some drainage.  This is from the oxygen used during your procedure.  There is no need for concern and it should clear up in a day or so.  SYMPTOMS TO REPORT IMMEDIATELY:   Following lower endoscopy (colonoscopy or flexible sigmoidoscopy):  Excessive amounts of blood in the stool  Significant tenderness or worsening of abdominal pains  Swelling of the abdomen that is new, acute  Fever of 100F or higher   For urgent or emergent issues, a gastroenterologist can be reached at any hour by calling [phone].   DIET:  We do recommend a small meal at first, but then you may proceed to your regular diet.  Drink plenty of fluids but you should avoid alcoholic beverages for 24 hours.  ACTIVITY:  You should plan to take it easy for the rest of today  and you should NOT DRIVE or use heavy machinery until tomorrow (because of the sedation medicines used during the test).    FOLLOW UP: Our staff will call the number listed on your records 48-72 hours following your procedure to check on you and address any questions or concerns that you may have regarding the information given to you following your procedure. If we do not reach you, we will leave a message.  We will attempt to reach you two times.  During this call, we will ask if you have developed any symptoms of COVID 19. If you develop any symptoms (ie: fever, flu-like symptoms, shortness of breath, cough etc.) before then, please call [phone].  If you test positive for Covid 19 in the 2 weeks post procedure, please call and report this information to [place].    If any biopsies were taken you will be contacted by phone or by letter within the next 1-3 weeks.  Please call us at [phone] if you have not heard about the biopsies in 3 weeks.    SIGNATURES/CONFIDENTIALITY: You and/or your care partner have signed paperwork which will be entered into your electronic medical record.  These signatures attest to the fact that that the information above on your After Visit Summary has been reviewed and is understood.  Full responsibility of the confidentiality of this discharge information lies with you and/or your care-partner.

## 2019-05-23 NOTE — Op Note (Signed)
[facility] Patient Name: Robin Banks Procedure Date: 05/23/2019 8:29 AM MRN: [id number] Endoscopist: Mallie Mussel L. Loletha Carrow , MD Age: 64 Referring MD:  Date of Birth: [birth year] Gender: Female Account #: [id number] Procedure:                Colonoscopy Indications:              Screening for colorectal malignant neoplasm (no                            polyps last colonoscopy at age 8) Medicines:                Monitored Anesthesia Care Procedure:                Pre-Anesthesia Assessment:                           - Prior to the procedure, a History and Physical                            was performed, and patient medications and                            allergies were reviewed. The patient's tolerance of                            previous anesthesia was also reviewed. The risks                            and benefits of the procedure and the sedation                            options and risks were discussed with the patient.                            All questions were answered, and informed consent                            was obtained. Prior Anticoagulants: The patient has                            taken no previous anticoagulant or antiplatelet                            agents. ASA Grade Assessment: II - A patient with                            mild systemic disease. After reviewing the risks                            and benefits, the patient was deemed in                            satisfactory condition to undergo the procedure.  After obtaining informed consent, the colonoscope                            was passed under direct vision. Throughout the                            procedure, the patient's blood pressure, pulse, and                            oxygen saturations were monitored continuously. The                            Colonoscope was introduced through the anus and                            advanced to the the terminal  ileum, with                            identification of the appendiceal orifice and IC                            valve. The colonoscopy was performed without                            difficulty. The patient tolerated the procedure                            well. The quality of the bowel preparation was good                            after lavage. The terminal ileum, ileocecal valve,                            appendiceal orifice, and rectum were photographed.                            The bowel preparation used was Sutab. Scope In: 8:34:36 AM Scope Out: 8:49:58 AM Scope Withdrawal Time: 0 hours 10 minutes 18 seconds  Total Procedure Duration: 0 hours 15 minutes 22 seconds  Findings:                 The perianal and digital rectal examinations were                            normal.                           The terminal ileum appeared normal.                           Multiple diverticula were found in the left colon                            and right colon.  The exam was otherwise without abnormality on                            direct and retroflexion views. Complications:            No immediate complications. Estimated Blood Loss:     Estimated blood loss: none. Impression:               - The examined portion of the ileum was normal.                           - Diverticulosis in the left colon and in the right                            colon.                           - The examination was otherwise normal on direct                            and retroflexion views.                           - No specimens collected. Recommendation:           - Patient has a contact number available for                            emergencies. The signs and symptoms of potential                            delayed complications were discussed with the                            patient. Return to normal activities tomorrow.                            Written  discharge instructions were provided to the                            patient.                           - Resume previous diet.                           - Continue present medications.                           - Repeat colonoscopy in 10 years for screening                            purposes. Mckinnley Cottier L. Loletha Carrow, MD 05/23/2019 8:53:40 AM This report has been signed electronically.

## 2019-05-27 ENCOUNTER — Telehealth: Payer: Self-pay | Admitting: *Deleted

## 2019-05-27 NOTE — Telephone Encounter (Signed)
Follow call made, no answer, left message.

## 2019-05-27 NOTE — Telephone Encounter (Signed)
Second follow up call made, no answer, left message. 

## 2019-06-02 ENCOUNTER — Ambulatory Visit: Payer: Medicare HMO | Attending: Internal Medicine

## 2019-06-02 DIAGNOSIS — Z23 Encounter for immunization: Secondary | ICD-10-CM | POA: Insufficient documentation

## 2019-06-02 NOTE — Progress Notes (Signed)
   Covid-19 Vaccination Clinic  Name:  Robin Banks    MRN: [id number] DOB: 10/09/1955  06/02/2019  [name] was observed post Covid-19 immunization for 15 minutes without incident. She was provided with Vaccine Information Sheet and instruction to access the V-Safe system.   [name] was instructed to call 911 with any severe reactions post vaccine: Marland Kitchen Difficulty breathing  . Swelling of face and throat  . A fast heartbeat  . A bad rash all over body  . Dizziness and weakness   Immunizations Administered    Name Date Dose VIS Date Route   Pfizer COVID-19 Vaccine 06/02/2019  1:31 PM 0.3 mL 03/08/2019 Intramuscular   Manufacturer: [place]   Lot: [id number]   [place]: [id number]

## 2019-06-11 ENCOUNTER — Ambulatory Visit (INDEPENDENT_AMBULATORY_CARE_PROVIDER_SITE_OTHER): Payer: Medicare HMO | Admitting: Family Medicine

## 2019-06-11 ENCOUNTER — Other Ambulatory Visit: Payer: Self-pay

## 2019-06-11 ENCOUNTER — Encounter: Payer: Self-pay | Admitting: Family Medicine

## 2019-06-11 VITALS — BP 123/84 | HR 81 | Temp 98.0°F | Resp 16 | Ht 62.0 in

## 2019-06-11 DIAGNOSIS — J4540 Moderate persistent asthma, uncomplicated: Secondary | ICD-10-CM | POA: Diagnosis not present

## 2019-06-11 DIAGNOSIS — R0602 Shortness of breath: Secondary | ICD-10-CM | POA: Diagnosis not present

## 2019-06-11 MED ORDER — CETIRIZINE HCL 10 MG PO TABS: 10.0000 mg | ORAL_TABLET | Freq: Every day | ORAL | 2 refills | Status: DC

## 2019-06-11 MED ORDER — MONTELUKAST SODIUM 10 MG PO TABS: 10.0000 mg | ORAL_TABLET | Freq: Every day | ORAL | 3 refills | Status: DC

## 2019-06-11 NOTE — Patient Instructions (Signed)
Start zyrtec and Singulair before bed. Continue  Flonase, qvar and albuterol when needed.   I have referred you to allergy and asthma for further evaluation.    Asthma, Adult  Asthma is a long-term (chronic) condition in which the airways get tight and narrow. The airways are the breathing passages that lead from the nose and mouth down into the lungs. A person with asthma will have times when symptoms get worse. These are called asthma attacks. They can cause coughing, whistling sounds when you breathe (wheezing), shortness of breath, and chest pain. They can make it hard to breathe. There is no cure for asthma, but medicines and lifestyle changes can help control it. There are many things that can bring on an asthma attack or make asthma symptoms worse (triggers). Common triggers include:  Mold.  Dust.  Cigarette smoke.  Cockroaches.  Things that can cause allergy symptoms (allergens). These include animal skin flakes (dander) and pollen from trees or grass.  Things that pollute the air. These may include household cleaners, wood smoke, smog, or chemical odors.  Cold air, weather changes, and wind.  Crying or laughing hard.  Stress.  Certain medicines or drugs.  Certain foods such as dried fruit, potato chips, and grape juice.  Infections, such as a cold or the flu.  Certain medical conditions or diseases.  Exercise or tiring activities. Asthma may be treated with medicines and by staying away from the things that cause asthma attacks. Types of medicines may include:  Controller medicines. These help prevent asthma symptoms. They are usually taken every day.  Fast-acting reliever or rescue medicines. These quickly relieve asthma symptoms. They are used as needed and provide short-term relief.  Allergy medicines if your attacks are brought on by allergens.  Medicines to help control the body's defense (immune) system. Follow these instructions at home: Avoiding  triggers in your home  Change your heating and air conditioning filter often.  Limit your use of fireplaces and wood stoves.  Get rid of pests (such as roaches and mice) and their droppings.  Throw away plants if you see mold on them.  Clean your floors. Dust regularly. Use cleaning products that do not smell.  Have someone vacuum when you are not home. Use a vacuum cleaner with a HEPA filter if possible.  Replace carpet with wood, tile, or vinyl flooring. Carpet can trap animal skin flakes and dust.  Use allergy-proof pillows, mattress covers, and box spring covers.  Wash bed sheets and blankets every week in hot water. Dry them in a dryer.  Keep your bedroom free of any triggers.  Avoid pets and keep windows closed when things that cause allergy symptoms are in the air.  Use blankets that are made of polyester or cotton.  Clean bathrooms and kitchens with bleach. If possible, have someone repaint the walls in these rooms with mold-resistant paint. Keep out of the rooms that are being cleaned and painted.  Wash your hands often with soap and water. If soap and water are not available, use hand sanitizer.  Do not allow anyone to smoke in your home. General instructions  Take over-the-counter and prescription medicines only as told by your doctor. ? Talk with your doctor if you have questions about how or when to take your medicines. ? Make note if you need to use your medicines more often than usual.  Do not use any products that contain nicotine or tobacco, such as cigarettes and e-cigarettes. If you need help quitting,  ask your doctor.  Stay away from secondhand smoke.  Avoid doing things outdoors when allergen counts are high and when air quality is low.  Wear a ski mask when doing outdoor activities in the winter. The mask should cover your nose and mouth. Exercise indoors on cold days if you can.  Warm up before you exercise. Take time to cool down after  exercise.  Use a peak flow meter as told by your doctor. A peak flow meter is a tool that measures how well the lungs are working.  Keep track of the peak flow meter's readings. Write them down.  Follow your asthma action plan. This is a written plan for taking care of your asthma and treating your attacks.  Make sure you get all the shots (vaccines) that your doctor recommends. Ask your doctor about a flu shot and a pneumonia shot.  Keep all follow-up visits as told by your doctor. This is important. Contact a doctor if:  You have wheezing, shortness of breath, or a cough even while taking medicine to prevent attacks.  The mucus you cough up (sputum) is thicker than usual.  The mucus you cough up changes from clear or white to yellow, green, gray, or bloody.  You have problems from the medicine you are taking, such as: ? A rash. ? Itching. ? Swelling. ? Trouble breathing.  You need reliever medicines more than 2-3 times a week.  Your peak flow reading is still at 50-79% of your personal best after following the action plan for 1 hour.  You have a fever. Get help right away if:  You seem to be worse and are not responding to medicine during an asthma attack.  You are short of breath even at rest.  You get short of breath when doing very little activity.  You have trouble eating, drinking, or talking.  You have chest pain or tightness.  You have a fast heartbeat.  Your lips or fingernails start to turn blue.  You are light-headed or dizzy, or you faint.  Your peak flow is less than 50% of your personal best.  You feel too tired to breathe normally. Summary  Asthma is a long-term (chronic) condition in which the airways get tight and narrow. An asthma attack can make it hard to breathe.  Asthma cannot be cured, but medicines and lifestyle changes can help control it.  Make sure you understand how to avoid triggers and how and when to use your medicines. This  information is not intended to replace advice given to you by your health care provider. Make sure you discuss any questions you have with your health care provider. Document Revised: 05/17/2018 Document Reviewed: 04/18/2016 Elsevier Patient Education  2020 [employer].

## 2019-06-11 NOTE — Progress Notes (Signed)
SUBJECTIVE Chief Complaint  Patient presents with  . Cough    Pt has had cough since December. Expierences SOB with exertion.     HPI: Robin Banks is a 64 y.o. female present today to discuss her cough she has noticed since December.  She reports full resolution of her symptoms after treatment at the end of November for presumed asthmatic bronchitis.  She has had a chronic cough off and on for several years.  She endorses shortness of breath with exertion.  She denies any chest pain or dizziness.  She does endorse occasional very mild edema of her lower extremities and her rings feeling tight.  She states she has been taking her Qvar 2 puffs twice a day.  She has not been using her albuterol inhaler.  She uses Claritin on occasions, but not routinely.  Last week she did try a 7-day course of the Mucinex DM which was not helpful.  She denies fever, chills, nausea or vomit.  She endorses mucus drainage in the morning.  Last seen in November for nasal congestion, postnasal drainage, productive green mucus feeling a light chest tightness and wheezing.  She has been using Mucinex, her inhalers and her albuterol BID and nasal saline solution.  She reports she has been using the Qvar 2 puffs once a day.  She denies fever, chills, nausea, vomit, diarrhea, headache.  She has not been exposed to known illnesses or COVID-19. Treated with z-pack, prednisone 5 day, albuterol and qvar.   ROS: See pertinent positives and negatives per HPI.  Patient Active Problem List   Diagnosis Date Noted  . Hallux rigidus, acquired, left 06/04/2018  . Elevated hemoglobin A1c 04/07/2017  . Moderate recurrent major depression ([place]) 01/30/2017  . Depression with anxiety 04/22/2016  . Postmenopausal symptoms 10/28/2015  . Osteopenia 10/28/2015  . Overactive bladder 10/28/2015  . Insomnia 10/12/2015  . Asthma 01/09/2007    Social History   Tobacco Use  . Smoking status: Never Smoker  . Smokeless tobacco: Never  Used  Substance Use Topics  . Alcohol use: Yes    Alcohol/week: 1.0 standard drinks    Types: 1 Glasses of wine per week    Current Outpatient Medications:  .  beclomethasone (QVAR REDIHALER) 40 MCG/ACT inhaler, Inhale 2 puffs into the lungs 2 (two) times daily., Disp: 11 g, Rfl: 11 .  Calcium Carb-Cholecalciferol (CALCIUM PLUS VITAMIN D3 PO), Take by mouth daily., Disp: , Rfl:  .  fluticasone (FLONASE) 50 MCG/ACT nasal spray, Place 2 sprays into both nostrils daily., Disp: 16 g, Rfl: 11 .  Multiple Vitamin (MULTIVITAMIN PO), Take by mouth daily., Disp: , Rfl:  .  traZODone (DESYREL) 150 MG tablet, Take 1 tablet (150 mg total) by mouth at bedtime., Disp: 90 tablet, Rfl: 1 .  cetirizine (ZYRTEC) 10 MG tablet, Take 1 tablet (10 mg total) by mouth daily., Disp: 30 tablet, Rfl: 2 .  montelukast (SINGULAIR) 10 MG tablet, Take 1 tablet (10 mg total) by mouth at bedtime., Disp: 30 tablet, Rfl: 3  Allergies  Allergen Reactions  . Demerol [Meperidine Hcl] Nausea And Vomiting  . Nitrofurantoin Nausea Only    swelling    OBJECTIVE: BP 123/84 (BP Location: Left Arm, Patient Position: Sitting, Cuff Size: Normal)   Pulse 81   Temp 98 F (36.7 C) (Temporal)   Resp 16   Ht 5\' 2"  (1.575 m)   SpO2 99%   BMI 23.37 kg/m  Gen: Afebrile. No acute distress.  Nontoxic in  appearance, well-developed, well-nourished, very pleasant Caucasian female. HENT: AT. [place]. Bilateral TM visualized and normal in appearance. MMM. Throat without erythema or exudates.  No cough or hoarseness on exam today. Eyes:Pupils Equal Round Reactive to light, Extraocular movements intact,  Conjunctiva without redness, discharge or icterus. Neck/lymp/endocrine: Supple,no lymphadenopathy CV: RRR no murmur, very small trace edema right lower extremity edema,  Chest: CTAB, no wheeze or crackles Neuro:  Normal gait. PERLA. EOMi. Alert. Oriented x3  Psych: Normal affect, dress and demeanor. Normal speech. Normal thought content and  judgment.   ASSESSMENT AND PLAN: DORLISA DELPHIA is a 64 y.o. female present for  Moderate persistent asthma without complication/mild shortness of breath Discussed options with her today surrounding treatment of her shortness of breath.  Even resolution after treatment in November do suspect more of a pulmonary/asthmatic cause of her symptoms.  Although we discussed today cannot rule out cardiac in nature and would first pursue controlling asthma-but if we cannot work out a resolution would refer to cardiology for further evaluation.  Currently there is no red flags to suggest need for urgent cardiac evaluation. -Start Zyrtec nightly -Start Singulair nightly -Continue Qvar 2 puffs twice daily -Continue albuterol as needed - Ambulatory referral to Allergy and asthma at the [facility] office. -If no improvement in symptoms within the next 4-6 weeks, or if symptoms are worsening patient to follow-up and would consider cardiac evaluation at that time.   Orders Placed This Encounter  Procedures  . Ambulatory referral to Allergy   Meds ordered this encounter  Medications  . montelukast (SINGULAIR) 10 MG tablet    Sig: Take 1 tablet (10 mg total) by mouth at bedtime.    Dispense:  30 tablet    Refill:  3  . cetirizine (ZYRTEC) 10 MG tablet    Sig: Take 1 tablet (10 mg total) by mouth daily.    Dispense:  30 tablet    Refill:  2    Referral Orders     Ambulatory referral to [place], DO 06/11/2019

## 2019-06-12 DIAGNOSIS — R69 Illness, unspecified: Secondary | ICD-10-CM | POA: Diagnosis not present

## 2019-06-19 NOTE — Progress Notes (Signed)
New Patient Note  RE: Robin Banks MRN: [id number] DOB: January 15, 1956 Date of Office Visit: 06/20/2019  Referring provider: Ma Hillock, DO Primary care provider: Ma Hillock, DO  Chief Complaint: Cough (Early December, mucus with it)  History of Present Illness: I had the pleasure of seeing Robin Banks for initial evaluation at the [facility] on 06/20/2019. She is a 64 y.o. female, who is referred here by Howard Pouch A, DO for the evaluation of cough.  Patient has history of coughing, chest tightness, shortness of breath, coughing, wheezing, nocturnal awakenings for 20 years. She usually has a few flares per year. She had a flare in August and November which was treated with prednisone with good benefit. Current flare started in December and the prednisone taper did not help. She also tried [employer] with no benefit. She is also having some beige colored mucous production with this.   Current medications include albuterol prn, Qvar 40 1-2 puffs QAM x 10 years which help. She reports not using aerochamber with inhalers. She tried the following inhalers: none. Main triggers are  infections, exercise. In the last month, frequency of symptoms: daily. Frequency of SABA use: <1x/week. Interference with physical activity: yes. Sleep is disturbed. In the last 12 months, emergency room visits/urgent care visits/doctor office visits or hospitalizations due to respiratory issues: 3. In the last 12 months, oral steroids courses: 2. Lifetime history of hospitalization for respiratory issues: no. Prior intubations: no. History of pneumonia: no. She was evaluated by pulmonologist in the past. Smoking exposure: no. Up to date with flu vaccine: yes. Up to date with pneumonia vaccine: yes.  History of reflux: yes but not on any medications currently. Her child recently passed away.   Assessment and Plan: Robin Banks is a 64 y.o. female with: Not well controlled mild persistent  asthma Diagnosed with asthma over 20 years ago and was using Qvar 40 1-2 puffs QAM and albuterol with good benefit. However she gets flares and prednisone usually helps. Current flare started in December with no improvement after prednisone and azithromycin. Daily coughing with some brownish phlegm production. Recently started on zyrtec, and Singulair with unknown benefit. History of GERD but not on any medications. No COVID-19 diagnosis. Denies cardiac symptoms.   Today's breathing test showed mixed obstructive and restrictive disease with 5% improvement in FEV1 post bronchodilator treatment. Clinically feeling the same.  Unable to skin test today due to recent antihistamine intake. . Daily controller medication(s): INCREASE QVAR 63mcg to 2 puffs twice a day and rinse mouth afterwards. o If not improved, then will start a ICS/LABA as I'm concerned she has uncontrolled asthma due to frequency of oral steroid use the past year.  . May use albuterol rescue inhaler 2 puffs or nebulizer every 4 to 6 hours as needed for shortness of breath, chest tightness, coughing, and wheezing. May use albuterol rescue inhaler 2 puffs 5 to 15 minutes prior to strenuous physical activities. Monitor frequency of use.   Coughing Diagnosed with asthma over 20 years ago and was using Qvar 40 1-2 puffs QAM and albuterol with good benefit. However she gets flares and prednisone usually helps. Current flare started in December with no improvement after prednisone and azithromycin. Daily coughing with some brownish phlegm production. Recently started on zyrtec, and Singulair with unknown benefit. History of GERD but not on any medications. No COVID-19 diagnosis. Denies cardiac symptoms.  . Discussed with patient that the most common causes of chronic cough  include the following: upper airway cough syndrome (UACS) which is caused by variety of rhinitis conditions; asthma; gastroesophageal reflux disease (GERD); chronic bronchitis  from cigarette smoking or other inhaled environmental irritants; non-asthmatic eosinophilic bronchitis; and bronchiectasis.  . In prospective studies, these conditions have accounted for up to 94% of the causes of chronic cough in immunocompetent adults.  . The history and physical examination suggest that her cough may be multifactorial in nature.   Get chest X-ray. Continue Singulair (montelukast) 10mg  daily. Continue Fluticasone 1 spray per nostril daily. HOLD cetrizine (zyrtec) for 3 days before the next visit so we can do skin testing. If testing unremarkable will start on PPI next.   Return in about 1 week (around 06/27/2019) for Skin testing.  Other allergy screening: Rhino conjunctivitis: yes  She reports symptoms of nasal congestion. Symptoms have been going on for 20 years. The symptoms are present all year around with worsening in spring and fall. Anosmia: no. Headache: yes. She has used zyrtec, Flonase 1 spray QAM, Singulair with unknown improvement in symptoms. Sinus infections: no. Previous work up includes: no. Previous ENT evaluation: yes but not recently. Previous sinus imaging: no. History of nasal polyps: no.  Food allergy: no Medication allergy: yes  Demerol and Macrobid Hymenoptera allergy: no Urticaria: no Eczema:no History of recurrent infections suggestive of immunodeficency: no  Diagnostics: Spirometry:  Tracings reviewed. Her effort: Good reproducible efforts. FVC: 2.21L FEV1: 1.45L, 68% predicted FEV1/FVC ratio: 66% Interpretation: Spirometry consistent with mixed obstructive and restrictive disease with 5% improvement in FEV1 post bronchodilator treatment. Clinically feeling the same. Please see scanned spirometry results for details.  Skin Testing: Deferred due to recent antihistamines use.  Past Medical History: Patient Active Problem List   Diagnosis Date Noted  . Not well controlled mild persistent asthma 06/20/2019  . Hallux rigidus, acquired,  left 06/04/2018  . Elevated hemoglobin A1c 04/07/2017  . Moderate recurrent major depression ([place]) 01/30/2017  . Depression with anxiety 04/22/2016  . Postmenopausal symptoms 10/28/2015  . Osteopenia 10/28/2015  . Overactive bladder 10/28/2015  . Insomnia 10/12/2015  . Asthma 01/09/2007  . Coughing 01/09/2007   Past Medical History:  Diagnosis Date  . Allergy   . Angio-edema   . Asthma   . Cataract   . Chicken pox   . Compression fracture of thoracic spine, non-traumatic (HCC)    T7-8  . Depression   . GERD (gastroesophageal reflux disease)   . Hyperlipidemia    no meds taken  . Insomnia   . Menopause   . Osteopenia   . Overactive bladder   . TMJ (temporomandibular joint syndrome)   . Urinary incontinence    Past Surgical History: Past Surgical History:  Procedure Laterality Date  . BLADDER SUSPENSION  2011   Dr. Matilde Sprang  . CARPAL TUNNEL RELEASE Bilateral 2014  . COLONOSCOPY    . TUBAL LIGATION Bilateral 1978   Medication List:  Current Outpatient Medications  Medication Sig Dispense Refill  . albuterol (VENTOLIN HFA) 108 (90 Base) MCG/ACT inhaler Inhale 1-2 puffs into the lungs every 6 (six) hours as needed for wheezing or shortness of breath.    . beclomethasone (QVAR REDIHALER) 40 MCG/ACT inhaler Inhale 2 puffs into the lungs 2 (two) times daily. 11 g 11  . Calcium Carb-Cholecalciferol (CALCIUM PLUS VITAMIN D3 PO) Take by mouth daily.    . cetirizine (ZYRTEC) 10 MG tablet Take 1 tablet (10 mg total) by mouth daily. 30 tablet 2  . fluticasone (FLONASE) 50 MCG/ACT nasal spray  Place 2 sprays into both nostrils daily. 16 g 11  . montelukast (SINGULAIR) 10 MG tablet Take 1 tablet (10 mg total) by mouth at bedtime. 30 tablet 3  . Multiple Vitamin (MULTIVITAMIN PO) Take by mouth daily.    . traZODone (DESYREL) 150 MG tablet Take 1 tablet (150 mg total) by mouth at bedtime. 90 tablet 1   No current facility-administered medications for this visit.    Allergies: Allergies  Allergen Reactions  . Demerol [Meperidine Hcl] Nausea And Vomiting  . Nitrofurantoin Nausea Only    swelling   Social History: Social History   Socioeconomic History  . Marital status: Married    Spouse name: Not on file  . Number of children: Not on file  . Years of education: Not on file  . Highest education level: Not on file  Occupational History  . Not on file  Tobacco Use  . Smoking status: Never Smoker  . Smokeless tobacco: Never Used  Substance and Sexual Activity  . Alcohol use: Yes    Alcohol/week: 1.0 standard drinks    Types: 1 Glasses of wine per week  . Drug use: No  . Sexual activity: Yes    Partners: Male    Comment: married  Other Topics Concern  . Not on file  Social History Narrative   Married to [facility]). Three children Robin Banks and Robin Banks.    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    Takes herbal remedies, daily vitamin,    Wears seatbelt   Exercises 3x a week   Smoke detector in the home.    Firearms in the home (locked)   Feels safe in relationship.    Social Determinants of Health   Financial Resource Strain:   . Difficulty of Paying Living Expenses:   Food Insecurity:   . Worried About Charity fundraiser in the Last Year:   . Arboriculturist in the Last Year:   Transportation Needs:   . Film/video editor (Medical):   Marland Kitchen Lack of Transportation (Non-Medical):   Physical Activity:   . Days of Exercise per Week:   . Minutes of Exercise per Session:   Stress:   . Feeling of Stress :   Social Connections:   . Frequency of Communication with Friends and Family:   . Frequency of Social Gatherings with Friends and Family:   . Attends Religious Services:   . Active Member of Clubs or Organizations:   . Attends Archivist Meetings:   Marland Kitchen Marital Status:    Lives in a 66 year old home. Smoking: denies Occupation: Insurance risk surveyor History: Water Damage/mildew in the house:  no Carpet in the bedroom: yes Heating: heat pump Cooling: heat pump Pet: yes 1 dog x 9 yrs  Family History: Family History  Problem Relation Age of Onset  . Hypertension Father   . CAD Father   . Hearing loss Father   . Heart disease Father   . COPD Father   . Hyperlipidemia Mother   . Arthritis Mother   . Alcohol abuse Paternal Grandfather   . Heart attack Paternal Grandfather   . CAD Paternal Grandfather   . Dementia Paternal Grandmother   . Aortic aneurysm Maternal Grandmother   . Cerebral palsy Son        Mild CP by records  . Kidney nephrosis Son        FSG, kidney transplant.   . Colon cancer Neg Hx   .  Esophageal cancer Neg Hx   . Stomach cancer Neg Hx   . Rectal cancer Neg Hx   . Allergic rhinitis Neg Hx   . Asthma Neg Hx   . Eczema Neg Hx   . Urticaria Neg Hx    Review of Systems  Constitutional: Negative for appetite change, chills, fever and unexpected weight change.  HENT: Positive for congestion and postnasal drip. Negative for rhinorrhea.   Eyes: Negative for itching.  Respiratory: Positive for cough. Negative for chest tightness, shortness of breath and wheezing.   Cardiovascular: Negative for chest pain.  Gastrointestinal: Negative for abdominal pain.  Genitourinary: Negative for difficulty urinating.  Skin: Negative for rash.  Allergic/Immunologic: Negative for food allergies.  Neurological: Positive for headaches.   Objective: BP 110/84 (BP Location: Right Arm, Patient Position: Sitting, Cuff Size: Normal)   Pulse 74   Temp 97.6 F (36.4 C) (Temporal)   Resp 16   Ht 5' 1.06" (1.551 m)   Wt 127 lb 8 oz (57.8 kg)   SpO2 99%   BMI 24.04 kg/m  Body mass index is 24.04 kg/m. Physical Exam  Constitutional: She is oriented to person, place, and time. She appears well-developed and well-nourished.  HENT:  Head: Normocephalic and atraumatic.  Nose: Nose normal.  Mouth/Throat: Oropharynx is clear and moist.  Bilateral cerumen impaction  Eyes:  Conjunctivae and EOM are normal.  Cardiovascular: Normal rate, regular rhythm and normal heart sounds. Exam reveals no gallop and no friction rub.  No murmur heard. Pulmonary/Chest: Effort normal and breath sounds normal. She has no wheezes. She has no rales.  Abdominal: Soft.  Musculoskeletal:     Cervical back: Neck supple.  Neurological: She is alert and oriented to person, place, and time.  Skin: Skin is warm. No rash noted.  Psychiatric: She has a normal mood and affect. Her behavior is normal.  Nursing note and vitals reviewed.  The plan was reviewed with the patient/family, and all questions/concerned were addressed.  It was my pleasure to see Kaelynne today and participate in her care. Please feel free to contact me with any questions or concerns.  Sincerely,  Rexene Alberts, DO Allergy & Immunology  [facility] office: [phone] [facility] office: [place] office: [phone]

## 2019-06-20 ENCOUNTER — Ambulatory Visit: Payer: Medicare HMO | Admitting: Allergy

## 2019-06-20 ENCOUNTER — Other Ambulatory Visit: Payer: Self-pay

## 2019-06-20 ENCOUNTER — Encounter: Payer: Self-pay | Admitting: Allergy

## 2019-06-20 VITALS — BP 110/84 | HR 74 | Temp 97.6°F | Resp 16 | Ht 61.06 in | Wt 127.5 lb

## 2019-06-20 DIAGNOSIS — J4540 Moderate persistent asthma, uncomplicated: Secondary | ICD-10-CM

## 2019-06-20 DIAGNOSIS — R05 Cough: Secondary | ICD-10-CM | POA: Diagnosis not present

## 2019-06-20 DIAGNOSIS — R059 Cough, unspecified: Secondary | ICD-10-CM

## 2019-06-20 NOTE — Assessment & Plan Note (Addendum)
Diagnosed with asthma over 20 years ago and was using Qvar 40 1-2 puffs QAM and albuterol with good benefit. However she gets flares and prednisone usually helps. Current flare started in December with no improvement after prednisone and azithromycin. Daily coughing with some brownish phlegm production. Recently started on zyrtec, and Singulair with unknown benefit. History of GERD but not on any medications. No COVID-19 diagnosis. Denies cardiac symptoms.  . Discussed with patient that the most common causes of chronic cough include the following: upper airway cough syndrome (UACS) which is caused by variety of rhinitis conditions; asthma; gastroesophageal reflux disease (GERD); chronic bronchitis from cigarette smoking or other inhaled environmental irritants; non-asthmatic eosinophilic bronchitis; and bronchiectasis.  . In prospective studies, these conditions have accounted for up to 94% of the causes of chronic cough in immunocompetent adults.  . The history and physical examination suggest that her cough may be multifactorial in nature.   Get chest X-ray. Continue Singulair (montelukast) 10mg  daily. Continue Fluticasone 1 spray per nostril daily. HOLD cetrizine (zyrtec) for 3 days before the next visit so we can do skin testing. If testing unremarkable will start on PPI next.

## 2019-06-20 NOTE — Assessment & Plan Note (Addendum)
Diagnosed with asthma over 20 years ago and was using Qvar 40 1-2 puffs QAM and albuterol with good benefit. However she gets flares and prednisone usually helps. Current flare started in December with no improvement after prednisone and azithromycin. Daily coughing with some brownish phlegm production. Recently started on zyrtec, and Singulair with unknown benefit. History of GERD but not on any medications. No COVID-19 diagnosis. Denies cardiac symptoms.   Today's breathing test showed mixed obstructive and restrictive disease with 5% improvement in FEV1 post bronchodilator treatment. Clinically feeling the same.  Unable to skin test today due to recent antihistamine intake. . Daily controller medication(s): INCREASE QVAR 25mcg to 2 puffs twice a day and rinse mouth afterwards. o If not improved, then will start a ICS/LABA as I'm concerned she has uncontrolled asthma due to frequency of oral steroid use the past year.  . May use albuterol rescue inhaler 2 puffs or nebulizer every 4 to 6 hours as needed for shortness of breath, chest tightness, coughing, and wheezing. May use albuterol rescue inhaler 2 puffs 5 to 15 minutes prior to strenuous physical activities. Monitor frequency of use.

## 2019-06-20 NOTE — Patient Instructions (Addendum)
Today's breathing test showed some abnormalities.  Asthma: . Daily controller medication(s): INCREASE QVAR 75mcg to 2 puffs twice a day and rinse mouth afterwards. . May use albuterol rescue inhaler 2 puffs or nebulizer every 4 to 6 hours as needed for shortness of breath, chest tightness, coughing, and wheezing. May use albuterol rescue inhaler 2 puffs 5 to 15 minutes prior to strenuous physical activities. Monitor frequency of use.  Asthma control goals:  Full participation in all desired activities (may need albuterol before activity) Albuterol use two times or less a week on average (not counting use with activity) Cough interfering with sleep two times or less a month Oral steroids no more than once a year No hospitalizations  Coughing:  Get chest X-ray. The most common causes of chronic cough include the following: upper airway cough syndrome (UACS) which is caused by variety of rhinitis conditions; asthma; gastroesophageal reflux disease (GERD) amongst others. Continue Singulair (montelukast) 10mg  daily. Continue Fluticasone 1 spray per nostril daily. HOLD cetrizine (zyrtec) for 3 days before the next visit so we can do skin testing.  Follow up in 1 week for skin testing.

## 2019-06-24 ENCOUNTER — Other Ambulatory Visit: Payer: Self-pay

## 2019-06-24 ENCOUNTER — Ambulatory Visit (HOSPITAL_COMMUNITY)
Admission: RE | Admit: 2019-06-24 | Discharge: 2019-06-24 | Disposition: A | Discharge status: Home / Self Care | Payer: Medicare HMO | Source: Ambulatory Visit | Attending: Allergy | Admitting: Allergy

## 2019-06-24 DIAGNOSIS — R05 Cough: Secondary | ICD-10-CM | POA: Diagnosis not present

## 2019-06-24 DIAGNOSIS — R059 Cough, unspecified: Secondary | ICD-10-CM

## 2019-06-27 ENCOUNTER — Encounter: Payer: Self-pay | Admitting: Allergy

## 2019-06-27 ENCOUNTER — Ambulatory Visit: Payer: Medicare HMO | Admitting: Allergy

## 2019-06-27 ENCOUNTER — Other Ambulatory Visit: Payer: Self-pay

## 2019-06-27 VITALS — BP 112/80 | HR 74 | Temp 97.7°F | Resp 16

## 2019-06-27 DIAGNOSIS — J3089 Other allergic rhinitis: Secondary | ICD-10-CM

## 2019-06-27 DIAGNOSIS — J4540 Moderate persistent asthma, uncomplicated: Secondary | ICD-10-CM | POA: Diagnosis not present

## 2019-06-27 DIAGNOSIS — H6123 Impacted cerumen, bilateral: Secondary | ICD-10-CM | POA: Diagnosis not present

## 2019-06-27 DIAGNOSIS — R059 Cough, unspecified: Secondary | ICD-10-CM

## 2019-06-27 DIAGNOSIS — R05 Cough: Secondary | ICD-10-CM

## 2019-06-27 MED ORDER — AZELASTINE-FLUTICASONE 137-50 MCG/ACT NA SUSP: 1.0000 | Freq: Two times a day (BID) | NASAL | 5 refills | Status: DC

## 2019-06-27 MED ORDER — BREO ELLIPTA 100-25 MCG/INH IN AEPB: 1.0000 | INHALATION_SPRAY | Freq: Every day | RESPIRATORY_TRACT | 5 refills | Status: DC

## 2019-06-27 NOTE — Assessment & Plan Note (Addendum)
Attempted removal but unable to remove hardened cerumen bilaterally.   May use debrox 5-10 drops in each ear twice a day for 4 days to loosen up the earwax.

## 2019-06-27 NOTE — Patient Instructions (Addendum)
Today's skin testing was positive to tree pollen, dust mites and one mold.   Asthma:  ? Daily controller medication(s):Start Breo 100 1 puff once a day and rinse mouth afterwards. Sample given. Demonstrated proper use.  ? This replaces Qvar for now.   ? If it's not covered let us know.   May use albuterol rescue inhaler 2 puffs or nebulizer every 4 to 6 hours as needed for shortness of breath, chest tightness, coughing, and wheezing. May use albuterol rescue inhaler 2 puffs 5 to 15 minutes prior to strenuous physical activities. Monitor frequency of use.   Repeat spirometry at next visit.   Allergic rhinitis:  Start environmental control measures.   Continue Singulair (montelukast) 10mg  daily.  Start dymista (fluticasone + azelastine nasal spray combination) 1 spray per nostril twice a day.  This replaces Flonase for now.  If insurance does not cover it let us know.   May use over the counter antihistamines such as Zyrtec (cetirizine) daily as needed.  Ear wax:  May use debrox 5-10 drops in each ear twice a day for 4 days to loosen up the earwax.  Follow up in 2 months or sooner if needed to check the coughing/asthma.   Reducing Pollen Exposure . Pollen seasons: trees (spring), grass (summer) and ragweed/weeds (fall). Marland Kitchen Keep windows closed in your home and car to lower pollen exposure.  Susa Simmonds air conditioning in the bedroom and throughout the house if possible.  . Avoid going out in dry windy days - especially early morning. . Pollen counts are highest between 5 - 10 AM and on dry, hot and windy days.  . Save outside activities for late afternoon or after a heavy rain, when pollen levels are lower.  . Avoid mowing of grass if you have grass pollen allergy. Marland Kitchen Be aware that pollen can also be transported indoors on people and pets.  . Dry your clothes in an automatic dryer rather than hanging them outside where they might collect pollen.  . Rinse hair and eyes before  bedtime.  Control of House Dust Mite Allergen . Dust mite allergens are a common trigger of allergy and asthma symptoms. While they can be found throughout the house, these microscopic creatures thrive in warm, humid environments such as bedding, upholstered furniture and carpeting. . Because so much time is spent in the bedroom, it is essential to reduce mite levels there.  . Encase pillows, mattresses, and box springs in special allergen-proof fabric covers or airtight, zippered plastic covers.  . Bedding should be washed weekly in hot water (130 F) and dried in a hot dryer. Allergen-proof covers are available for comforters and pillows that can't be regularly washed.  Wendee Copp the allergy-proof covers every few months. Minimize clutter in the bedroom. Keep pets out of the bedroom.  Marland Kitchen Keep humidity less than 50% by using a dehumidifier or air conditioning. You can buy a humidity measuring device called a hygrometer to monitor this.  . If possible, replace carpets with hardwood, linoleum, or washable area rugs. If that's not possible, vacuum frequently with a vacuum that has a HEPA filter. . Remove all upholstered furniture and non-washable window drapes from the bedroom. . Remove all non-washable stuffed toys from the bedroom.  Wash stuffed toys weekly.  Mold Control . Mold and fungi can grow on a variety of surfaces provided certain temperature and moisture conditions exist.  . Outdoor molds grow on plants, decaying vegetation and soil. The major outdoor mold, Alternaria and  Cladosporium, are found in very high numbers during hot and dry conditions. Generally, a late summer - fall peak is seen for common outdoor fungal spores. Rain will temporarily lower outdoor mold spore count, but counts rise rapidly when the rainy period ends. . The most important indoor molds are Aspergillus and Penicillium. Dark, humid and poorly ventilated basements are ideal sites for mold growth. The next most common sites  of mold growth are the bathroom and the kitchen. Outdoor (Seasonal) Mold Control . Use air conditioning and keep windows closed. . Avoid exposure to decaying vegetation. Marland Kitchen Avoid leaf raking. . Avoid grain handling. . Consider wearing a face mask if working in moldy areas.  Indoor (Perennial) Mold Control  . Maintain humidity below 50%. . Get rid of mold growth on hard surfaces with water, detergent and, if necessary, 5% bleach (do not mix with other cleaners). Then dry the area completely. If mold covers an area more than 10 square feet, consider hiring an indoor environmental professional. . For clothing, washing with soap and water is best. If moldy items cannot be cleaned and dried, throw them away. . Remove sources e.g. contaminated carpets. . Repair and seal leaking roofs or pipes. Using dehumidifiers in damp basements may be helpful, but empty the water and clean units regularly to prevent mildew from forming. All rooms, especially basements, bathrooms and kitchens, require ventilation and cleaning to deter mold and mildew growth. Avoid carpeting on concrete or damp floors, and storing items in damp areas.

## 2019-06-27 NOTE — Assessment & Plan Note (Signed)
Past history - Diagnosed with asthma over 20 years ago and was using Qvar 40 1-2 puffs QAM and albuterol with good benefit. However she gets flares and prednisone usually helps. Current flare started in December with no improvement after prednisone and azithromycin. Daily coughing with some brownish phlegm production. Recently started on zyrtec, and Singulair with unknown benefit. History of GERD but not on any medications. No COVID-19 diagnosis. Denies cardiac symptoms. 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 Interim history - Chest x-ray showed no acute process. Using Qvar more regularly but not twice a day everyday and noted some improvement in coughing. No more phlegm.  ? Daily controller medication(s):Start Breo 100 1 puff once a day and rinse mouth afterwards. Sample given. Demonstrated proper use.  ? The once a day dosing of Breo hopefully will increase compliance.  ? This replaces Qvar for now.   ? If it's not covered let us know.   May use albuterol rescue inhaler 2 puffs or nebulizer every 4 to 6 hours as needed for shortness of breath, chest tightness, coughing, and wheezing. May use albuterol rescue inhaler 2 puffs 5 to 15 minutes prior to strenuous physical activities. Monitor frequency of use.   Repeat spirometry at next visit.

## 2019-06-27 NOTE — Assessment & Plan Note (Signed)
No worsening symptoms since off zyrtec. She does have PND.  Today's skin testing was positive to tree pollen, dust mites and one mold.   Start environmental control measures.   Continue Singulair (montelukast) 10mg  daily.  Start dymista (fluticasone + azelastine nasal spray combination) 1 spray per nostril twice a day.  This replaces Flonase for now.  If insurance does not cover it let us know.   May use over the counter antihistamines such as Zyrtec (cetirizine) daily as needed.

## 2019-06-27 NOTE — Progress Notes (Signed)
Follow Up Note  RE: Robin Banks MRN: [id number] DOB: [birth year] Date of Office Visit: 06/27/2019  Referring provider: Ma Hillock, DO Primary care provider: Ma Hillock, DO  Chief Complaint: Allergic Rhinitis   History of Present Illness: I had the pleasure of seeing Robin Banks for a follow up visit at the [facility] on 06/27/2019. She is a 64 y.o. female, who is being followed for asthma and coughing. Her previous allergy office visit was on 06/20/2019 with Dr. Maudie Mercury. Today is a Skin testing visit.  Asthma/coughing: Using Qvar 40 2 puffs 1-2 times a day on a more consistent basis since the last visit and noticed improvement in her coughing.   Stopped zyrtec for 3 days with no worsening symptoms.  Ear wax: Using debrox in her ears but has not noticed any ear wax coming out.  Assessment and Plan: Rowe is a 64 y.o. female with: Not well controlled moderate persistent asthma Past history - Diagnosed with asthma over 20 years ago and was using Qvar 40 1-2 puffs QAM and albuterol with good benefit. However she gets flares and prednisone usually helps. Current flare started in December with no improvement after prednisone and azithromycin. Daily coughing with some brownish phlegm production. Recently started on zyrtec, and Singulair with unknown benefit. History of GERD but not on any medications. No COVID-19 diagnosis. Denies cardiac symptoms. 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 Interim history - Chest x-ray showed no acute process. Using Qvar more regularly but not twice a day everyday and noted some improvement in coughing. No more phlegm.  ? Daily controller medication(s):Start Breo 100 1 puff once a day and rinse mouth afterwards. Sample given. Demonstrated proper use.  ? The once a day dosing of Breo hopefully will increase compliance.  ? This replaces Qvar for  now.   ? If it's not covered let us know.   May use albuterol rescue inhaler 2 puffs or nebulizer every 4 to 6 hours as needed for shortness of breath, chest tightness, coughing, and wheezing. May use albuterol rescue inhaler 2 puffs 5 to 15 minutes prior to strenuous physical activities. Monitor frequency of use.   Repeat spirometry at next visit.   Other allergic rhinitis No worsening symptoms since off zyrtec. She does have PND.  Today's skin testing was positive to tree pollen, dust mites and one mold.   Start environmental control measures.   Continue Singulair (montelukast) 10mg  daily.  Start dymista (fluticasone + azelastine nasal spray combination) 1 spray per nostril twice a day.  This replaces Flonase for now.  If insurance does not cover it let us know.   May use over the counter antihistamines such as Zyrtec (cetirizine) daily as needed.  Bilateral impacted cerumen Attempted removal but unable to remove hardened cerumen bilaterally.   May use debrox 5-10 drops in each ear twice a day for 4 days to loosen up the earwax.  Return in about 2 months (around 08/27/2019).  Meds ordered this encounter  Medications  . Azelastine-Fluticasone 137-50 MCG/ACT SUSP    Sig: Place 1 spray into the nose in the morning and at bedtime.    Dispense:  23 g    Refill:  5  . fluticasone furoate-vilanterol (BREO ELLIPTA) 100-25 MCG/INH AEPB    Sig: Inhale 1 puff into the lungs daily. Rinse mouth after each use.    Dispense:  60 each  Refill:  5   Diagnostics: Skin Testing: Environmental allergy panel. Positive test to: tree pollen, dust mites mold.  Results discussed with patient/family. Airborne Adult Perc - 06/27/19 1437    Time Antigen Placed  1437    Allergen Manufacturer  Lavella Hammock    Location  Back    Number of Test  59    Panel 1  Select    1. Control-Buffer 50% Glycerol  Negative    2. Control-Histamine 1 mg/ml  2+    3. Albumin saline  Negative    4. Shawnee  Negative      5. [place]  Negative    6. Johnson  Negative    7. [place] Blue  Negative    8. Meadow Fescue  Negative    9. Perennial Rye  Negative    10. Sweet Vernal  Negative    11. Timothy  Negative    12. Cocklebur  Negative    13. Burweed Marshelder  Negative    14. Ragweed, short  Negative    15. Ragweed, Giant  Negative    16. Plantain,  English  Negative    17. Lamb's Quarters  Negative    18. Sheep Sorrell  Negative    19. Rough Pigweed  Negative    20. Marsh Elder, Rough  Negative    21. Mugwort, Common  Negative    22. Ash mix  3+    23. Birch mix  Negative    24. Beech American  Negative    25. Box, Elder  Negative    26. Cedar, red  Negative    27. Cottonwood, [place]  Negative    28. Elm mix  Negative    29. Hickory mix  2+    30. Maple mix  Negative    31. Oak, [place] mix  Negative    32. Pecan Pollen  2+    33. Pine mix  Negative    34. Sycamore Eastern  Negative    35. [place], Black Pollen  --   +/-   36. Alternaria alternata  Negative    37. Cladosporium Herbarum  Negative    38. Aspergillus mix  Negative    39. Penicillium mix  Negative    40. Bipolaris sorokiniana (Helminthosporium)  Negative    41. Drechslera spicifera (Curvularia)  Negative    42. Mucor plumbeus  Negative    43. Fusarium moniliforme  Negative    44. Aureobasidium pullulans (pullulara)  Negative    45. Rhizopus oryzae  Negative    46. Botrytis cinera  Negative    47. Epicoccum nigrum  Negative    48. Phoma betae  Negative    49. Candida Albicans  2+    50. Trichophyton mentagrophytes  Negative    51. Mite, D Farinae  5,000 AU/ml  3+    52. Mite, D Pteronyssinus  5,000 AU/ml  3+    53. Cat Hair 10,000 BAU/ml  Negative    54.  Dog Epithelia  Negative    55. Mixed Feathers  Negative    56. Horse Epithelia  Negative    57. Cockroach, German  Negative    58. Mouse  Negative    59. Tobacco Leaf  Negative     Intradermal - 06/27/19 1518    Time Antigen Placed  1518    Allergen  Manufacturer  Lavella Hammock    Location  Back    Number of Test  13    Intradermal  Select  Control  Negative    Guatemala  Negative    Johnson  Negative    7 Grass  Negative    Ragweed mix  Negative    Weed mix  Negative    Mold 1  Negative    Mold 2  Negative    Mold 3  Negative    Mold 4  Negative    Cat  Negative    Dog  Negative    Cockroach  Negative       Medication List:  Current Outpatient Medications  Medication Sig Dispense Refill  . albuterol (VENTOLIN HFA) 108 (90 Base) MCG/ACT inhaler Inhale 1-2 puffs into the lungs every 6 (six) hours as needed for wheezing or shortness of breath.    . beclomethasone (QVAR REDIHALER) 40 MCG/ACT inhaler Inhale 2 puffs into the lungs 2 (two) times daily. 11 g 11  . Calcium Carb-Cholecalciferol (CALCIUM PLUS VITAMIN D3 PO) Take by mouth daily.    . cetirizine (ZYRTEC) 10 MG tablet Take 1 tablet (10 mg total) by mouth daily. 30 tablet 2  . fluticasone (FLONASE) 50 MCG/ACT nasal spray Place 2 sprays into both nostrils daily. 16 g 11  . montelukast (SINGULAIR) 10 MG tablet Take 1 tablet (10 mg total) by mouth at bedtime. 30 tablet 3  . Multiple Vitamin (MULTIVITAMIN PO) Take by mouth daily.    . traZODone (DESYREL) 150 MG tablet Take 1 tablet (150 mg total) by mouth at bedtime. 90 tablet 1  . Azelastine-Fluticasone 137-50 MCG/ACT SUSP Place 1 spray into the nose in the morning and at bedtime. 23 g 5  . fluticasone furoate-vilanterol (BREO ELLIPTA) 100-25 MCG/INH AEPB Inhale 1 puff into the lungs daily. Rinse mouth after each use. 60 each 5   No current facility-administered medications for this visit.   Allergies: Allergies  Allergen Reactions  . Demerol [Meperidine Hcl] Nausea And Vomiting  . Nitrofurantoin Nausea Only    swelling   I reviewed her past medical history, social history, family history, and environmental history and no significant changes have been reported from her previous visit.  Review of Systems  Constitutional:  Negative for appetite change, chills, fever and unexpected weight change.  HENT: Positive for congestion and postnasal drip. Negative for rhinorrhea.   Eyes: Negative for itching.  Respiratory: Positive for cough. Negative for chest tightness, shortness of breath and wheezing.   Cardiovascular: Negative for chest pain.  Gastrointestinal: Negative for abdominal pain.  Genitourinary: Negative for difficulty urinating.  Skin: Negative for rash.  Allergic/Immunologic: Positive for environmental allergies. Negative for food allergies.  Neurological: Positive for headaches.   Objective: BP 112/80 (BP Location: Left Arm, Patient Position: Sitting, Cuff Size: Normal)   Pulse 74   Temp 97.7 F (36.5 C) (Temporal)   Resp 16   SpO2 99%  There is no height or weight on file to calculate BMI. Physical Exam  Constitutional: She is oriented to person, place, and time. She appears well-developed and well-nourished.  HENT:  Head: Normocephalic and atraumatic.  Nose: Nose normal.  Mouth/Throat: Oropharynx is clear and moist.  Bilateral cerumen impaction  Eyes: Conjunctivae and EOM are normal.  Cardiovascular: Normal rate, regular rhythm and normal heart sounds. Exam reveals no gallop and no friction rub.  No murmur heard. Pulmonary/Chest: Effort normal and breath sounds normal. She has no wheezes. She has no rales.  Abdominal: Soft.  Musculoskeletal:     Cervical back: Neck supple.  Neurological: She is alert and oriented to person, place, and time.  Skin: Skin is warm. No rash noted.  Psychiatric: She has a normal mood and affect. Her behavior is normal.  Nursing note and vitals reviewed.  Previous notes and tests were reviewed. The plan was reviewed with the patient/family, and all questions/concerned were addressed.  It was my pleasure to see Debhora today and participate in her care. Please feel free to contact me with any questions or concerns.  Sincerely,  Rexene Alberts, DO Allergy &  Immunology  [facility] office: [phone] [facility] office: [place] office: [phone]

## 2019-07-02 ENCOUNTER — Ambulatory Visit: Payer: Medicare HMO | Attending: Internal Medicine

## 2019-07-02 DIAGNOSIS — Z23 Encounter for immunization: Secondary | ICD-10-CM

## 2019-07-02 NOTE — Progress Notes (Signed)
   Covid-19 Vaccination Clinic  Name:  Robin Banks    MRN: [id number] DOB: 1955-11-02  07/02/2019  Robin Banks was observed post Covid-19 immunization for 15 minutes without incident. She was provided with Vaccine Information Sheet and instruction to access the V-Safe system.   Robin Banks was instructed to call 911 with any severe reactions post vaccine: Marland Kitchen Difficulty breathing  . Swelling of face and throat  . A fast heartbeat  . A bad rash all over body  . Dizziness and weakness   Immunizations Administered    Name Date Dose VIS Date Route   Pfizer COVID-19 Vaccine 07/02/2019  3:14 PM 0.3 mL 03/08/2019 Intramuscular   Manufacturer: [place]   Lot: [id number]   [place]: [id number]

## 2019-07-15 ENCOUNTER — Other Ambulatory Visit: Payer: Self-pay | Admitting: Family Medicine

## 2019-07-15 DIAGNOSIS — G4700 Insomnia, unspecified: Secondary | ICD-10-CM

## 2019-07-16 NOTE — Telephone Encounter (Signed)
LMOM asking for call back.  

## 2019-07-16 NOTE — Telephone Encounter (Signed)
Pt was last seen for this condition 11/2018. She is due for her 6 mos follow up. We did see her last month for an acute visit for worsening asthma only.   I have refilled her trazodone for 30 days to allow her time to schedule (may be virtual). She can be added on today if she would like for virtual visit this afternoon.

## 2019-07-19 ENCOUNTER — Other Ambulatory Visit: Payer: Self-pay | Admitting: Family Medicine

## 2019-07-19 DIAGNOSIS — G4700 Insomnia, unspecified: Secondary | ICD-10-CM

## 2019-08-06 ENCOUNTER — Telehealth: Payer: Self-pay | Admitting: Allergy

## 2019-08-06 NOTE — Telephone Encounter (Signed)
Patient was seen for allergy testing on 06/27/19. She is on Qvar, that Dr. Raoul Pitch wrote for her and it works well. She said a prescription for Adair Patter was sent to her pharmacy, but is more expensive than the Qvar. She said when she was here, she was shown a chart of 4 different inhalers and  was told, her insurance was required to cover one of them, but she cannot remember what the 4 inhalers were and would like that information so she can research them.

## 2019-08-06 NOTE — Telephone Encounter (Signed)
Dr. Kim please advise 

## 2019-08-07 NOTE — Telephone Encounter (Signed)
Called patient and gave list. She is going to call her insurance company and will call back with more preferred affordable options.

## 2019-08-07 NOTE — Telephone Encounter (Signed)
Please call patient back. These are similar inhalers as Breo. She can check about coverage.  Symbicort 80 2 puffs twice a day Dulera 100 2 puffs twice a day Advair HFA 115 2 puffs twice a day Advair Diskus 250 1 puff twice a day Airduo Respiclick [id number] 1 puff twice a day Wixela 250 1 puff twice a day

## 2019-08-09 ENCOUNTER — Telehealth: Payer: Self-pay | Admitting: Allergy

## 2019-08-09 MED ORDER — BUDESONIDE-FORMOTEROL FUMARATE 80-4.5 MCG/ACT IN AERO: 2.0000 | INHALATION_SPRAY | Freq: Two times a day (BID) | RESPIRATORY_TRACT | 5 refills | Status: DC

## 2019-08-09 NOTE — Telephone Encounter (Signed)
Dr. Maudie Mercury there are inhalers that are preferred by the patient's insurance. Please advise which inhaler you would like for her to switch to.

## 2019-08-09 NOTE — Telephone Encounter (Signed)
Patient called and got a list of inhalers that her ins. Pay for symbicort 80, Advair HFA 115, Advair Diskus [id number], Airduo Respiclick [id number]. [facility] [place]. [phone].

## 2019-08-09 NOTE — Telephone Encounter (Signed)
Sent in script for Symbicort 80 2 puffs BID.

## 2019-08-12 ENCOUNTER — Other Ambulatory Visit: Payer: Self-pay

## 2019-08-12 ENCOUNTER — Ambulatory Visit (INDEPENDENT_AMBULATORY_CARE_PROVIDER_SITE_OTHER): Payer: Medicare HMO | Admitting: Family Medicine

## 2019-08-12 ENCOUNTER — Encounter: Payer: Self-pay | Admitting: Family Medicine

## 2019-08-12 VITALS — BP 119/80 | HR 69 | Temp 97.5°F | Resp 18 | Ht 61.0 in

## 2019-08-12 DIAGNOSIS — J4540 Moderate persistent asthma, uncomplicated: Secondary | ICD-10-CM

## 2019-08-12 DIAGNOSIS — G4700 Insomnia, unspecified: Secondary | ICD-10-CM | POA: Diagnosis not present

## 2019-08-12 DIAGNOSIS — F5101 Primary insomnia: Secondary | ICD-10-CM

## 2019-08-12 DIAGNOSIS — R69 Illness, unspecified: Secondary | ICD-10-CM | POA: Diagnosis not present

## 2019-08-12 MED ORDER — ALBUTEROL SULFATE HFA 108 (90 BASE) MCG/ACT IN AERS: 1.0000 | INHALATION_SPRAY | Freq: Four times a day (QID) | RESPIRATORY_TRACT | 11 refills | Status: DC | PRN

## 2019-08-12 MED ORDER — MONTELUKAST SODIUM 10 MG PO TABS: 10.0000 mg | ORAL_TABLET | Freq: Every day | ORAL | 1 refills | Status: DC

## 2019-08-12 MED ORDER — TRAZODONE HCL 150 MG PO TABS: 150.0000 mg | ORAL_TABLET | Freq: Every day | ORAL | 1 refills | Status: DC

## 2019-08-12 NOTE — Progress Notes (Signed)
Robin Banks , 1956/02/17, 63 y.o., female MRN: [id number] Patient Care Team    Relationship Specialty Notifications Start End  Ma Hillock, DO PCP - General Family Medicine  10/26/15   Arvella Nigh, MD Consulting Physician Obstetrics and Gynecology  10/28/15   Tanda Rockers, MD Consulting Physician Pulmonary Disease  10/28/15     Chief Complaint  Patient presents with  . Asthma  . Insomnia    Subjective: Robin Banks is a 64 y.o. present for [facility] Insomnia:She reports nightly use of trazodone 150 mg. No side effects. She is doing well on this medication.   Asthma/cough:  Cough is resolved. She is now been seen with by allergy and asthma. They prescribe her symbicort and dymista (tried qvar and breo[too expensive]). She reports compliance with singulair. She uses zyrtec when needed.   Mood Disorder Screen: negative 04/22/2016  Depression screen [facility] 2/9 12/05/2018 06/04/2018 12/04/2017 04/06/2017 01/30/2017  Decreased Interest 0 0 0 0 2  Down, Depressed, Hopeless 1 1 1  0 2  PHQ - 2 Score 1 1 1  0 4  Altered sleeping 0 3 0 0 0  Tired, decreased energy 0 1 2 0 1  Change in appetite 0 0 0 0 0  Feeling bad or failure about yourself  0 0 0 0 0  Trouble concentrating 0 0 0 0 0  Moving slowly or fidgety/restless 0 0 0 0 0  Suicidal thoughts 0 0 0 0 0  PHQ-9 Score 1 5 3  0 5  Difficult doing work/chores Not difficult at all Somewhat difficult Not difficult at all Not difficult at all Somewhat difficult   GAD 7 : Generalized Anxiety Score 12/05/2018 06/04/2018 04/06/2017 03/24/2016  Nervous, Anxious, on Edge 0 0 0 3  Control/stop worrying 0 0 0 1  Worry too much - different things 0 0 0 1  Trouble relaxing 1 0 0 2  Restless 0 0 0 0  Easily annoyed or irritable 0 0 0 1  Afraid - awful might happen 0 0 0 0  Total GAD 7 Score 1 0 0 8  Anxiety Difficulty Not difficult at all Somewhat difficult Not difficult at all Somewhat difficult    Allergies  Allergen Reactions  . Demerol  [Meperidine Hcl] Nausea And Vomiting  . Nitrofurantoin Nausea Only    swelling   Social History   Tobacco Use  . Smoking status: Never Smoker  . Smokeless tobacco: Never Used  Substance Use Topics  . Alcohol use: Yes    Alcohol/week: 1.0 standard drinks    Types: 1 Glasses of wine per week   Past Medical History:  Diagnosis Date  . Allergy   . Angio-edema   . Asthma   . Cataract   . Chicken pox   . Compression fracture of thoracic spine, non-traumatic (HCC)    T7-8  . Depression   . GERD (gastroesophageal reflux disease)   . Hyperlipidemia    no meds taken  . Insomnia   . Menopause   . Osteopenia   . Overactive bladder   . TMJ (temporomandibular joint syndrome)   . Urinary incontinence    Past Surgical History:  Procedure Laterality Date  . BLADDER SUSPENSION  2011   Dr. Matilde Sprang  . CARPAL TUNNEL RELEASE Bilateral 2014  . COLONOSCOPY    . TUBAL LIGATION Bilateral 1978   Family History  Problem Relation Age of Onset  . Hypertension Father   . CAD Father   . Hearing loss  Father   . Heart disease Father   . COPD Father   . Hyperlipidemia Mother   . Arthritis Mother   . Alcohol abuse Paternal Grandfather   . Heart attack Paternal Grandfather   . CAD Paternal Grandfather   . Dementia Paternal Grandmother   . Aortic aneurysm Maternal Grandmother   . Cerebral palsy Son        Mild CP by records  . Kidney nephrosis Son        FSG, kidney transplant.   . Colon cancer Neg Hx   . Esophageal cancer Neg Hx   . Stomach cancer Neg Hx   . Rectal cancer Neg Hx   . Allergic rhinitis Neg Hx   . Asthma Neg Hx   . Eczema Neg Hx   . Urticaria Neg Hx    Allergies as of 08/12/2019      Reactions   Demerol [meperidine Hcl] Nausea And Vomiting   Nitrofurantoin Nausea Only   swelling      Medication List       Accurate as of Aug 12, 2019  8:31 AM. If you have any questions, ask your nurse or doctor.        STOP taking these medications   Breo Ellipta 100-25  MCG/INH Aepb Generic drug: fluticasone furoate-vilanterol Stopped by: Howard Pouch, DO   cetirizine 10 MG tablet Commonly known as: ZYRTEC Stopped by: Howard Pouch, DO   fluticasone 50 MCG/ACT nasal spray Commonly known as: FLONASE Stopped by: Howard Pouch, DO   Qvar RediHaler 40 MCG/ACT inhaler Generic drug: beclomethasone Stopped by: Howard Pouch, DO     TAKE these medications   albuterol 108 (90 Base) MCG/ACT inhaler Commonly known as: VENTOLIN HFA Inhale 1-2 puffs into the lungs every 6 (six) hours as needed for wheezing or shortness of breath.   Azelastine-Fluticasone 137-50 MCG/ACT Susp Place 1 spray into the nose in the morning and at bedtime.   budesonide-formoterol 80-4.5 MCG/ACT inhaler Commonly known as: Symbicort Inhale 2 puffs into the lungs in the morning and at bedtime. Rinse mouth after each use   CALCIUM PLUS VITAMIN D3 PO Take by mouth daily.   montelukast 10 MG tablet Commonly known as: SINGULAIR Take 1 tablet (10 mg total) by mouth at bedtime.   MULTIVITAMIN PO Take by mouth daily.   traZODone 150 MG tablet Commonly known as: DESYREL Take 1 tablet (150 mg total) by mouth at bedtime.       No results found for this or any previous visit (from the past 24 hour(s)). No results found.   ROS: Negative, with the exception of above mentioned in HPI   Objective:  BP 119/80 (BP Location: Left Arm, Patient Position: Sitting, Cuff Size: Normal)   Pulse 69   Temp (!) 97.5 F (36.4 C) (Temporal)   Resp 18   Ht 5\' 1"  (1.549 m)   SpO2 97%   BMI 24.09 kg/m  Body mass index is 24.09 kg/m. Gen: Afebrile. No acute distress.  HENT: AT. [place].  Eyes:Pupils Equal Round Reactive to light, Extraocular movements intact,  Conjunctiva without redness, discharge or icterus. CV: RRR, noedema,a Chest: CTAB, no wheeze or crackles  Neuro:  Normal gait. PERLA. EOMi. Alert. Oriented x3  Psych: Normal affect, dress and demeanor. Normal speech. Normal thought  content and judgment.   Assessment/Plan: Robin Banks is a 64 y.o. female present for acute OV for  Insomnia, unspecified type - stable - continue trazodone 150 mg Qhs - Follow-up every 6  months on current issues.  Moderate persistent asthma without complication/cough - stable.  - continue QVAR provided today - continue  flonase.   - start OTC oral antihistamine.    No orders of the defined types were placed in this encounter.  Meds ordered this encounter  Medications  . traZODone (DESYREL) 150 MG tablet    Sig: Take 1 tablet (150 mg total) by mouth at bedtime.    Dispense:  90 tablet    Refill:  1  . montelukast (SINGULAIR) 10 MG tablet    Sig: Take 1 tablet (10 mg total) by mouth at bedtime.    Dispense:  90 tablet    Refill:  1  . albuterol (VENTOLIN HFA) 108 (90 Base) MCG/ACT inhaler    Sig: Inhale 1-2 puffs into the lungs every 6 (six) hours as needed for wheezing or shortness of breath.    Dispense:  8 g    Refill:  11    Hold until pt request   Referral Orders  No referral(s) requested today   Return in about 6 months (around 01/29/2020) for CPE (30 min)/MW/CMC.   electronically signed by:  Howard Pouch, DO  [place]

## 2019-08-12 NOTE — Patient Instructions (Addendum)
Great to see you today.  Have a great Summer.  I have refilled your meds that we prescribe.   Follow up Early November for physical and we will complete labs that day.

## 2019-09-11 NOTE — Progress Notes (Deleted)
Follow Up Note  RE: Robin Banks MRN: [id number] DOB: [birth year] Date of Office Visit: 09/12/2019  Referring provider: Ma Hillock, DO Primary care provider: Ma Hillock, DO  Chief Complaint: No chief complaint on file.  History of Present Illness: I had the pleasure of seeing Robin Banks for a follow up visit at the [facility] on 09/11/2019. She is a 64 y.o. female, who is being followed for asthma, allergic rhinitis. Her previous allergy office visit was on 06/27/2019 with Dr. Maudie Mercury. Today is a regular follow up visit.  Not well controlled moderate persistent asthma Past history - Diagnosed with asthma over 20 years ago and was using Qvar 40 1-2 puffs QAM and albuterol with good benefit. However she gets flares and prednisone usually helps. Current flare started in December with no improvement after prednisone and azithromycin. Daily coughing with some brownish phlegm production. Recently started on zyrtec, and Singulair with unknown benefit. History of GERD but not on any medications. No COVID-19 diagnosis. Denies cardiac symptoms. 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 Interim history - Chest x-ray showed no acute process. Using Qvar more regularly but not twice a day everyday and noted some improvement in coughing. No more phlegm.  ? Daily controller medication(s):Start Breo 100 1 puff once a day and rinse mouth afterwards. Sample given. Demonstrated proper use.   The once a day dosing of Breo hopefully will increase compliance.   This replaces Qvar for now.   If it's not covered let us know.   May use albuterol rescue inhaler 2 puffs or nebulizer every 4 to 6 hours as needed for shortness of breath, chest tightness, coughing, and wheezing. May use albuterol rescue inhaler 2 puffs 5 to 15 minutes prior to strenuous physical activities. Monitor frequency of  use.  Repeat spirometry at next visit.   Other allergic rhinitis No worsening symptoms since off zyrtec. She does have PND.  Today's skin testing was positive to tree pollen, dust mites and one mold.   Start environmental control measures.   Continue Singulair (montelukast) 10mg  daily.  Start dymista (fluticasone + azelastine nasal spray combination) 1 spray per nostril twice a day. ? This replaces Flonase for now. ? If insurance does not cover it let us know.   May use over the counter antihistamines such as Zyrtec (cetirizine) daily as needed.  Bilateral impacted cerumen Attempted removal but unable to remove hardened cerumen bilaterally.   May use debrox 5-10 drops in each ear twice a day for 4 days to loosen up the earwax.   Assessment and Plan: Robin Banks is a 64 y.o. female with: No problem-specific Assessment & Plan notes found for this encounter.  No follow-ups on file.  No orders of the defined types were placed in this encounter.  Lab Orders  No laboratory test(s) ordered today    Diagnostics: Spirometry:  Tracings reviewed. Her effort: {Blank single:19197::"Good reproducible efforts.","It was hard to get consistent efforts and there is a question as to whether this reflects a maximal maneuver.","Poor effort, data can not be interpreted."} FVC: ***L FEV1: ***L, ***% predicted FEV1/FVC ratio: ***% Interpretation: {Blank single:19197::"Spirometry consistent with mild obstructive disease","Spirometry consistent with moderate obstructive disease","Spirometry consistent with severe obstructive disease","Spirometry consistent with possible restrictive disease","Spirometry consistent with mixed obstructive and restrictive disease","Spirometry uninterpretable due to technique","Spirometry consistent with normal pattern","No overt abnormalities noted given today's efforts"}.  Please see scanned spirometry results for details.  Skin Testing: {Blank single:19197::"Select  foods","Environmental allergy panel","Environmental allergy panel and select foods","Food allergy panel","None","Deferred due to recent antihistamines use"}. Positive test to: ***. Negative test to: ***.  Results discussed with patient/family.   Medication List:  Current Outpatient Medications  Medication Sig Dispense Refill  . albuterol (VENTOLIN HFA) 108 (90 Base) MCG/ACT inhaler Inhale 1-2 puffs into the lungs every 6 (six) hours as needed for wheezing or shortness of breath. 8 g 11  . Azelastine-Fluticasone 137-50 MCG/ACT SUSP Place 1 spray into the nose in the morning and at bedtime. 23 g 5  . budesonide-formoterol (SYMBICORT) 80-4.5 MCG/ACT inhaler Inhale 2 puffs into the lungs in the morning and at bedtime. Rinse mouth after each use 1 Inhaler 5  . Calcium Carb-Cholecalciferol (CALCIUM PLUS VITAMIN D3 PO) Take by mouth daily.    . montelukast (SINGULAIR) 10 MG tablet Take 1 tablet (10 mg total) by mouth at bedtime. 90 tablet 1  . Multiple Vitamin (MULTIVITAMIN PO) Take by mouth daily.    . traZODone (DESYREL) 150 MG tablet Take 1 tablet (150 mg total) by mouth at bedtime. 90 tablet 1   No current facility-administered medications for this visit.   Allergies: Allergies  Allergen Reactions  . Demerol [Meperidine Hcl] Nausea And Vomiting  . Nitrofurantoin Nausea Only    swelling   I reviewed her past medical history, social history, family history, and environmental history and no significant changes have been reported from her previous visit.  Review of Systems  Constitutional: Negative for appetite change, chills, fever and unexpected weight change.  HENT: Positive for congestion and postnasal drip. Negative for rhinorrhea.   Eyes: Negative for itching.  Respiratory: Positive for cough. Negative for chest tightness, shortness of breath and wheezing.   Cardiovascular: Negative for chest pain.  Gastrointestinal: Negative for abdominal pain.  Genitourinary: Negative for  difficulty urinating.  Skin: Negative for rash.  Allergic/Immunologic: Positive for environmental allergies. Negative for food allergies.  Neurological: Positive for headaches.   Objective: There were no vitals taken for this visit. There is no height or weight on file to calculate BMI. Physical Exam Vitals and nursing note reviewed.  Constitutional:      Appearance: She is well-developed.  HENT:     Head: Normocephalic and atraumatic.     Nose: Nose normal.  Eyes:     Conjunctiva/sclera: Conjunctivae normal.  Cardiovascular:     Rate and Rhythm: Normal rate and regular rhythm.     Heart sounds: Normal heart sounds. No murmur heard.  No friction rub. No gallop.   Pulmonary:     Effort: Pulmonary effort is normal.     Breath sounds: Normal breath sounds. No wheezing or rales.  Abdominal:     Palpations: Abdomen is soft.  Musculoskeletal:     Cervical back: Neck supple.  Skin:    General: Skin is warm.     Findings: No rash.  Neurological:     Mental Status: She is alert and oriented to person, place, and time.  Psychiatric:        Behavior: Behavior normal.    Previous notes and tests were reviewed. The plan was reviewed with the patient/family, and all questions/concerned were addressed.  It was my pleasure to see Robin Banks today and participate in her care. Please feel free to contact me with any questions or concerns.  Sincerely,  Rexene Alberts, DO Allergy & Immunology  [facility] office: [phone] [facility] office: [place] office:  [phone] 

## 2019-09-12 ENCOUNTER — Ambulatory Visit: Payer: Medicare HMO | Admitting: Allergy

## 2019-09-18 NOTE — Progress Notes (Signed)
Follow Up Note  RE: Robin Banks MRN: [id number] DOB: [birth year] Date of Office Visit: 09/19/2019  Referring provider: Ma Hillock, DO Primary care provider: Ma Hillock, DO  Chief Complaint: Asthma (per patient, well controlled)  History of Present Illness: I had the pleasure of seeing Robin Banks for a follow up visit at the [facility] on 09/19/2019. She is a 64 y.o. female, who is being followed for asthma, allergic rhinitis, cerumen impaction. Her previous allergy office visit was on 06/27/2019 with Dr. Maudie Mercury. Today is a regular follow up visit.  Asthma  Patent finished Breo sample and now on Symbicort 80 2 puffs twice a day and rinsing mouth after each use. Chiropodist.   Noted some voice change for the past week but this sometimes happened even before on the inhalers. She thinks she has a spacer at home.   Denies any coughing, wheezing, chest tightness, nocturnal awakenings, ER/urgent care visits or prednisone use since the last visit.  No rescue inhaler use at all. Coughing resolved.   Other allergic rhinitis Taking Singulair at night.  Dymista was too expensive. Using Flonase 1 spray per nostril once a day. No nosebleeds.  Bilateral impacted cerumen Using debrox at times. Issues with hearing loss.   Assessment and Plan: Robin Banks is a 64 y.o. female with: Moderate persistent asthma without complication Past history - Diagnosed with asthma over 20 years ago and was using Qvar 40 1-2 puffs QAM and albuterol with good benefit. However she gets flares and prednisone usually helps. Current flare started in December with no improvement after prednisone and azithromycin. Daily coughing with some brownish phlegm production. Recently started on zyrtec, and Singulair with unknown benefit. History of GERD but not on any medications. No COVID-19 diagnosis. Denies cardiac symptoms. 2021 breathing test showed mixed obstructive and restrictive  disease with 5% improvement in FEV1 post bronchodilator treatment. Chest x-ray showed no acute process. Interim history - coughing resolved with Symbicort. 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  Today's spirometry was normal.  ACT score 23.  Daily controller medication(s):continue with Symbicort 92mcg 2 puffs twice a day with spacer and rinse mouth afterwards. If you don't have a spacer at home let us know.   May use albuterol rescue inhaler 2 puffs or nebulizer every 4 to 6 hours as needed for shortness of breath, chest tightness, coughing, and wheezing. May use albuterol rescue inhaler 2 puffs 5 to 15 minutes prior to strenuous physical activities. Monitor frequency of use.  Repeat spirometry at next visit.   Seasonal and perennial allergic rhinitis Past history - 2011 skin testing was positive to tree pollen, dust mites and one mold.  Interim history - dymista too expensive. Doing better since tree pollen season is over.  Continue environmental control measures.   Continue Singulair (montelukast) 10mg  daily.  May use Flonase (fluticasone) nasal spray 1 spray per nostril twice a day as needed for nasal congestion.   May use over the counter antihistamines such as Zyrtec (cetirizine) daily as needed.  Bilateral impacted cerumen Using debrox at times. Removed some cerumen bilaterally.   May use debrox 5-10 drops in each ear twice a day for 4 days to loosen up the earwax.  Return in about 3 months (around 12/20/2019).  Diagnostics: Spirometry:  Tracings reviewed. Her effort: Good reproducible efforts. FVC: 2.40L FEV1: 1.65L, 77% predicted FEV1/FVC ratio: 69% Interpretation: Spirometry consistent with normal pattern.  Please  see scanned spirometry results for details.  Cerumen Impaction Removal  Date/Time: 09/19/2019  3:41 PM  Performed by: Rexene Alberts, DO Authorized by: Rexene Alberts, DO   Consent:    Consent obtained:  Verbal    Consent given by:  Patient   Risks discussed:  Pain, dizziness, bleeding Procedure details:    Location:  L ear and R ear   Procedure type: curette and water rinse Post-procedure details:    Inspection:  canal normal, TM not able to visualize.   Hearing quality:  unchanged   Patient tolerance of procedure:  Tolerated well, no immediate complications.   Medication List:  Current Outpatient Medications  Medication Sig Dispense Refill  . albuterol (VENTOLIN HFA) 108 (90 Base) MCG/ACT inhaler Inhale 1-2 puffs into the lungs every 6 (six) hours as needed for wheezing or shortness of breath. 8 g 11  . budesonide-formoterol (SYMBICORT) 80-4.5 MCG/ACT inhaler Inhale 2 puffs into the lungs in the morning and at bedtime. Rinse mouth after each use 1 Inhaler 5  . Calcium Carb-Cholecalciferol (CALCIUM PLUS VITAMIN D3 PO) Take by mouth daily.    . fluticasone (VERAMYST) 27.5 MCG/SPRAY nasal spray Place 2 sprays into the nose daily.    . montelukast (SINGULAIR) 10 MG tablet Take 1 tablet (10 mg total) by mouth at bedtime. 90 tablet 1  . Multiple Vitamin (MULTIVITAMIN PO) Take by mouth daily.    . traZODone (DESYREL) 150 MG tablet Take 1 tablet (150 mg total) by mouth at bedtime. 90 tablet 1  . Azelastine-Fluticasone 137-50 MCG/ACT SUSP Place 1 spray into the nose in the morning and at bedtime. (Patient not taking: Reported on 09/19/2019) 23 g 5   No current facility-administered medications for this visit.   Allergies: Allergies  Allergen Reactions  . Demerol [Meperidine Hcl] Nausea And Vomiting  . Nitrofurantoin Nausea Only    swelling   I reviewed her past medical history, social history, family history, and environmental history and no significant changes have been reported from her previous visit.  Review of Systems  Constitutional: Negative for appetite change, chills, fever and unexpected weight change.  HENT: Positive for postnasal drip. Negative for congestion and rhinorrhea.   Eyes:  Negative for itching.  Respiratory: Negative for cough, chest tightness, shortness of breath and wheezing.   Cardiovascular: Negative for chest pain.  Gastrointestinal: Negative for abdominal pain.  Genitourinary: Negative for difficulty urinating.  Skin: Negative for rash.  Allergic/Immunologic: Positive for environmental allergies. Negative for food allergies.   Objective: BP 118/78 (BP Location: Right Arm, Patient Position: Sitting, Cuff Size: Normal)   Pulse 71   Temp 97.6 F (36.4 C) (Temporal)   Resp 17   Ht 5' 1.6" (1.565 m)   Wt 117 lb 6.4 oz (53.3 kg)   SpO2 99%   BMI 21.75 kg/m  Body mass index is 21.75 kg/m. Physical Exam Vitals and nursing note reviewed.  Constitutional:      Appearance: Normal appearance. She is well-developed and normal weight.  HENT:     Head: Normocephalic and atraumatic.     Right Ear: External ear normal. There is impacted cerumen.     Left Ear: External ear normal. There is impacted cerumen.     Nose: Nose normal. No congestion or rhinorrhea.     Mouth/Throat:     Mouth: Mucous membranes are moist.     Pharynx: Oropharynx is clear.  Eyes:     Conjunctiva/sclera: Conjunctivae normal.  Cardiovascular:     Rate and Rhythm: Normal  rate and regular rhythm.     Heart sounds: Normal heart sounds. No murmur heard.  No friction rub. No gallop.   Pulmonary:     Effort: Pulmonary effort is normal.     Breath sounds: Normal breath sounds. No wheezing or rales.  Musculoskeletal:     Cervical back: Neck supple.  Skin:    General: Skin is warm.     Findings: No rash.  Neurological:     Mental Status: She is alert and oriented to person, place, and time.  Psychiatric:        Mood and Affect: Mood normal.        Behavior: Behavior normal.    Previous notes and tests were reviewed. The plan was reviewed with the patient/family, and all questions/concerned were addressed.  It was my pleasure to see Robin Banks today and participate in her care.  Please feel free to contact me with any questions or concerns.  Sincerely,  Rexene Alberts, DO Allergy & Immunology  [facility] office: [phone] [facility] office: [place] office: [phone]

## 2019-09-19 ENCOUNTER — Ambulatory Visit: Payer: Medicare HMO | Admitting: Allergy

## 2019-09-19 ENCOUNTER — Encounter: Payer: Self-pay | Admitting: Allergy

## 2019-09-19 ENCOUNTER — Other Ambulatory Visit: Payer: Self-pay

## 2019-09-19 VITALS — BP 118/78 | HR 71 | Temp 97.6°F | Resp 17 | Ht 61.6 in | Wt 117.4 lb

## 2019-09-19 DIAGNOSIS — H6123 Impacted cerumen, bilateral: Secondary | ICD-10-CM | POA: Diagnosis not present

## 2019-09-19 DIAGNOSIS — J3089 Other allergic rhinitis: Secondary | ICD-10-CM | POA: Diagnosis not present

## 2019-09-19 DIAGNOSIS — J302 Other seasonal allergic rhinitis: Secondary | ICD-10-CM | POA: Diagnosis not present

## 2019-09-19 DIAGNOSIS — J4540 Moderate persistent asthma, uncomplicated: Secondary | ICD-10-CM | POA: Diagnosis not present

## 2019-09-19 NOTE — Patient Instructions (Addendum)
Asthma:  ? Daily controller medication(s):continue with Symbicort 83mcg 2 puffs twice a day with spacer and rinse mouth afterwards. ? If you don't have a spacer at home let us know.  ? May use albuterol rescue inhaler 2 puffs or nebulizer every 4 to 6 hours as needed for shortness of breath, chest tightness, coughing, and wheezing. May use albuterol rescue inhaler 2 puffs 5 to 15 minutes prior to strenuous physical activities. Monitor frequency of use.  Asthma control goals:  Full participation in all desired activities (may need albuterol before activity) Albuterol use two times or less a week on average (not counting use with activity) Cough interfering with sleep two times or less a month Oral steroids no more than once a year No hospitalizations  Allergic rhinitis:  2021 allergy testing was positive to tree pollen, dust mites and one mold.   Continue environmental control measures.   Continue Singulair (montelukast) 10mg  daily.  May use Flonase (fluticasone) nasal spray 1 spray per nostril twice a day as needed for nasal congestion.   May use over the counter antihistamines such as Zyrtec (cetirizine) daily as needed.  Ear wax:  May use debrox 5-10 drops in each ear twice a day for 4 days to loosen up the earwax.  Follow up in 3 months or sooner if needed.   Reducing Pollen Exposure . Pollen seasons: trees (spring), grass (summer) and ragweed/weeds (fall). Marland Kitchen Keep windows closed in your home and car to lower pollen exposure.  Susa Simmonds air conditioning in the bedroom and throughout the house if possible.  . Avoid going out in dry windy days - especially early morning. . Pollen counts are highest between 5 - 10 AM and on dry, hot and windy days.  . Save outside activities for late afternoon or after a heavy rain, when pollen levels are lower.  . Avoid mowing of grass if you have grass pollen allergy. Marland Kitchen Be aware that pollen can also be transported indoors on people and pets.   . Dry your clothes in an automatic dryer rather than hanging them outside where they might collect pollen.  . Rinse hair and eyes before bedtime.  Control of House Dust Mite Allergen . Dust mite allergens are a common trigger of allergy and asthma symptoms. While they can be found throughout the house, these microscopic creatures thrive in warm, humid environments such as bedding, upholstered furniture and carpeting. . Because so much time is spent in the bedroom, it is essential to reduce mite levels there.  . Encase pillows, mattresses, and box springs in special allergen-proof fabric covers or airtight, zippered plastic covers.  . Bedding should be washed weekly in hot water (130 F) and dried in a hot dryer. Allergen-proof covers are available for comforters and pillows that can't be regularly washed.  Wendee Copp the allergy-proof covers every few months. Minimize clutter in the bedroom. Keep pets out of the bedroom.  Marland Kitchen Keep humidity less than 50% by using a dehumidifier or air conditioning. You can buy a humidity measuring device called a hygrometer to monitor this.  . If possible, replace carpets with hardwood, linoleum, or washable area rugs. If that's not possible, vacuum frequently with a vacuum that has a HEPA filter. . Remove all upholstered furniture and non-washable window drapes from the bedroom. . Remove all non-washable stuffed toys from the bedroom.  Wash stuffed toys weekly.  Mold Control . Mold and fungi can grow on a variety of surfaces provided certain temperature and moisture  conditions exist.  . Outdoor molds grow on plants, decaying vegetation and soil. The major outdoor mold, Alternaria and Cladosporium, are found in very high numbers during hot and dry conditions. Generally, a late summer - fall peak is seen for common outdoor fungal spores. Rain will temporarily lower outdoor mold spore count, but counts rise rapidly when the rainy period ends. . The most important indoor  molds are Aspergillus and Penicillium. Dark, humid and poorly ventilated basements are ideal sites for mold growth. The next most common sites of mold growth are the bathroom and the kitchen. Outdoor (Seasonal) Mold Control . Use air conditioning and keep windows closed. . Avoid exposure to decaying vegetation. Marland Kitchen Avoid leaf raking. . Avoid grain handling. . Consider wearing a face mask if working in moldy areas.  Indoor (Perennial) Mold Control  . Maintain humidity below 50%. . Get rid of mold growth on hard surfaces with water, detergent and, if necessary, 5% bleach (do not mix with other cleaners). Then dry the area completely. If mold covers an area more than 10 square feet, consider hiring an indoor environmental professional. . For clothing, washing with soap and water is best. If moldy items cannot be cleaned and dried, throw them away. . Remove sources e.g. contaminated carpets. . Repair and seal leaking roofs or pipes. Using dehumidifiers in damp basements may be helpful, but empty the water and clean units regularly to prevent mildew from forming. All rooms, especially basements, bathrooms and kitchens, require ventilation and cleaning to deter mold and mildew growth. Avoid carpeting on concrete or damp floors, and storing items in damp areas.

## 2019-09-19 NOTE — Assessment & Plan Note (Signed)
Past history - 2011 skin testing was positive to tree pollen, dust mites and one mold.  Interim history - dymista too expensive. Doing better since tree pollen season is over.  Continue environmental control measures.   Continue Singulair (montelukast) 10mg  daily.  May use Flonase (fluticasone) nasal spray 1 spray per nostril twice a day as needed for nasal congestion.   May use over the counter antihistamines such as Zyrtec (cetirizine) daily as needed.

## 2019-09-19 NOTE — Assessment & Plan Note (Signed)
Past history - Diagnosed with asthma over 20 years ago and was using Qvar 40 1-2 puffs QAM and albuterol with good benefit. However she gets flares and prednisone usually helps. Current flare started in December with no improvement after prednisone and azithromycin. Daily coughing with some brownish phlegm production. Recently started on zyrtec, and Singulair with unknown benefit. History of GERD but not on any medications. No COVID-19 diagnosis. Denies cardiac symptoms. 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 Chest x-ray showed no acute process. Interim history - coughing resolved with Symbicort. 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  Today's spirometry was normal.  ACT score 23.  Daily controller medication(s):continue with Symbicort 33mcg 2 puffs twice a day with spacer and rinse mouth afterwards. If you don't have a spacer at home let us know.   May use albuterol rescue inhaler 2 puffs or nebulizer every 4 to 6 hours as needed for shortness of breath, chest tightness, coughing, and wheezing. May use albuterol rescue inhaler 2 puffs 5 to 15 minutes prior to strenuous physical activities. Monitor frequency of use.  Repeat spirometry at next visit.

## 2019-09-19 NOTE — Assessment & Plan Note (Signed)
Using debrox at times. Removed some cerumen bilaterally.   May use debrox 5-10 drops in each ear twice a day for 4 days to loosen up the earwax.

## 2019-10-29 DIAGNOSIS — H2513 Age-related nuclear cataract, bilateral: Secondary | ICD-10-CM | POA: Diagnosis not present

## 2019-10-29 DIAGNOSIS — G43B0 Ophthalmoplegic migraine, not intractable: Secondary | ICD-10-CM | POA: Diagnosis not present

## 2019-12-25 NOTE — Progress Notes (Signed)
Follow Up Note  RE: Robin Banks MRN: [id number] DOB: 1955/11/17 Date of Office Visit: 12/26/2019  Referring provider: Ma Hillock, DO Primary care provider: Ma Hillock, DO  Chief Complaint: Follow-up and Asthma (has a productive cough, coughing up green phlegm )  History of Present Illness: I had the pleasure of seeing Robin Banks for a follow up visit at the [facility] on 12/26/2019. She is a 64 y.o. female, who is being followed for asthma, allergic rhinitis. Her previous allergy office visit was on 09/19/2019 with Dr. Maudie Mercury. Today is a regular follow up visit.  Moderate persistent asthma Patient has been coughing for the past 2 weeks with light green/brownish phlegm. Mainly feels in her throat. Symptoms are staying the same. No spacer at home.  Currently on Symbicort 5mcg 2 puffs twice a day and rinse mouth after each use. Used albuterol the past week with some benefit. Denies any SOB, wheezing, chest tightness, nocturnal awakenings, ER/urgent care visits or prednisone use since the last visit.  No fevers or chills. No sick contacts.   Seasonal and perennial allergic rhinitis Currently taking generic OTC antihistamines in the morning, montelukast daily at night, fluticasone 2 sprays daily.   Assessment and Plan: Robin Banks is a 64 y.o. female with: Moderate persistent asthma without complication Past history - Diagnosed with asthma over 20 years ago and was using Qvar 40 1-2 puffs QAM and albuterol with good benefit. However she gets flares and prednisone usually helps. Current flare started in December with no improvement after prednisone and azithromycin. Daily coughing with some brownish phlegm production. Recently started on zyrtec, and Singulair with unknown benefit. History of GERD but not on any medications. No COVID-19 diagnosis. Denies cardiac symptoms. 2021 breathing test showed mixed obstructive and restrictive disease with 5% improvement in  FEV1 post bronchodilator treatment. Chest x-ray showed no acute process. Breo not covered. Interim history - coughing the last 2 weeks feels like it's coming from her throat.   Today's spirometry showed some restriction with no improvement in FEV1 post bronchodilator treatment. Clinically feeling the same.   Coughing most likely from PND.  ACT score 16.  Take some Mucinex to loosen up the phlegm twice a day. Take with plenty of water.  ? Daily controller medication(s):continue with Symbicort 24mcg 2 puffs twice a day with spacer and rinse mouth afterwards. ? Spacer given and demonstrated proper use with inhaler. Patient understood technique and all questions/concerned were addressed.  ? May use albuterol rescue inhaler 2 puffs or nebulizer every 4 to 6 hours as needed for shortness of breath, chest tightness, coughing, and wheezing. May use albuterol rescue inhaler 2 puffs 5 to 15 minutes prior to strenuous physical activities. Monitor frequency of use.   Seasonal and perennial allergic rhinitis Past history - 2011 skin testing was positive to tree pollen, dust mites and one mold. Dymista too expensive Interim history - PND with coughing.   Continue environmental control measures.   Continue Singulair (montelukast) 10mg  daily.  May use Flonase (fluticasone) nasal spray 2 sprays per nostril once a day as needed for nasal congestion.   May use azelastine nasal spray 1-2 sprays per nostril twice a day as needed for runny nose/drainage.  May use over the counter antihistamines such as Zyrtec (cetirizine) daily as needed.  Bilateral impacted cerumen  May use debrox 5-10 drops in each ear twice a day for 4 days to loosen up the earwax.  Return in about 4 months (  around 04/26/2020).  Meds ordered this encounter  Medications  . fluticasone (FLONASE) 50 MCG/ACT nasal spray    Sig: Place 2 sprays into both nostrils daily.    Dispense:  16 g    Refill:  5  . Azelastine HCl 0.15 % SOLN     Sig: Place 1-2 sprays into the nose 2 (two) times daily as needed (runny nose, post nasal drip).    Dispense:  30 mL    Refill:  5   Diagnostics: Spirometry:  Tracings reviewed. Her effort: It was hard to get consistent efforts and there is a question as to whether this reflects a maximal maneuver. FVC: 1.78L FEV1: 1.42L, 67% predicted FEV1/FVC ratio: 80% Interpretation: Spirometry consistent with possible restrictive disease with no improvement in FEV1 post bronchodilator treatment. Clinically feeling the same.  Please see scanned spirometry results for details.  Medication List:  Current Outpatient Medications  Medication Sig Dispense Refill  . albuterol (VENTOLIN HFA) 108 (90 Base) MCG/ACT inhaler Inhale 1-2 puffs into the lungs every 6 (six) hours as needed for wheezing or shortness of breath. 8 g 11  . budesonide-formoterol (SYMBICORT) 80-4.5 MCG/ACT inhaler Inhale 2 puffs into the lungs in the morning and at bedtime. Rinse mouth after each use 1 Inhaler 5  . Calcium Carb-Cholecalciferol (CALCIUM PLUS VITAMIN D3 PO) Take by mouth daily.    . montelukast (SINGULAIR) 10 MG tablet Take 1 tablet (10 mg total) by mouth at bedtime. 90 tablet 1  . Multiple Vitamin (MULTIVITAMIN PO) Take by mouth daily.    . Red Yeast Rice 600 MG CAPS Take 2 capsules by mouth daily.    . traZODone (DESYREL) 150 MG tablet Take 1 tablet (150 mg total) by mouth at bedtime. 90 tablet 1  . Azelastine HCl 0.15 % SOLN Place 1-2 sprays into the nose 2 (two) times daily as needed (runny nose, post nasal drip). 30 mL 5  . fluticasone (FLONASE) 50 MCG/ACT nasal spray Place 2 sprays into both nostrils daily. 16 g 5   No current facility-administered medications for this visit.   Allergies: Allergies  Allergen Reactions  . Demerol [Meperidine Hcl] Nausea And Vomiting  . Nitrofurantoin Nausea Only    swelling   I reviewed her past medical history, social history, family history, and environmental history and no  significant changes have been reported from her previous visit.  Review of Systems  Constitutional: Negative for appetite change, chills, fever and unexpected weight change.  HENT: Positive for congestion, postnasal drip and voice change. Negative for rhinorrhea.   Eyes: Negative for itching.  Respiratory: Positive for cough. Negative for chest tightness, shortness of breath and wheezing.   Cardiovascular: Negative for chest pain.  Gastrointestinal: Negative for abdominal pain.  Genitourinary: Negative for difficulty urinating.  Skin: Negative for rash.  Allergic/Immunologic: Positive for environmental allergies. Negative for food allergies.   Objective: BP 110/70   Pulse 78   Temp 98.2 F (36.8 C) (Temporal)   Resp 16   SpO2 96%  There is no height or weight on file to calculate BMI. Physical Exam Vitals and nursing note reviewed.  Constitutional:      Appearance: Normal appearance. She is well-developed and normal weight.  HENT:     Head: Normocephalic and atraumatic.     Right Ear: External ear normal. There is impacted cerumen.     Left Ear: External ear normal. There is impacted cerumen.     Nose: Nose normal. No congestion or rhinorrhea.  Mouth/Throat:     Mouth: Mucous membranes are moist.     Pharynx: Oropharynx is clear.  Eyes:     Conjunctiva/sclera: Conjunctivae normal.  Cardiovascular:     Rate and Rhythm: Normal rate and regular rhythm.     Heart sounds: Normal heart sounds. No murmur heard.  No friction rub. No gallop.   Pulmonary:     Effort: Pulmonary effort is normal.     Breath sounds: Normal breath sounds. No wheezing or rales.  Musculoskeletal:     Cervical back: Neck supple.  Skin:    General: Skin is warm.     Findings: No rash.  Neurological:     Mental Status: She is alert and oriented to person, place, and time.  Psychiatric:        Mood and Affect: Mood normal.        Behavior: Behavior normal.    Previous notes and tests were  reviewed. The plan was reviewed with the patient/family, and all questions/concerned were addressed.  It was my pleasure to see Robin Banks today and participate in her care. Please feel free to contact me with any questions or concerns.  Sincerely,  Rexene Alberts, DO Allergy & Immunology  [facility] [place] office: [place] office: [phone]

## 2019-12-26 ENCOUNTER — Telehealth: Payer: Self-pay

## 2019-12-26 ENCOUNTER — Other Ambulatory Visit: Payer: Self-pay

## 2019-12-26 ENCOUNTER — Encounter: Payer: Self-pay | Admitting: Allergy

## 2019-12-26 ENCOUNTER — Ambulatory Visit (INDEPENDENT_AMBULATORY_CARE_PROVIDER_SITE_OTHER): Payer: Medicare HMO | Admitting: Allergy

## 2019-12-26 VITALS — BP 110/70 | HR 78 | Temp 98.2°F | Resp 16

## 2019-12-26 DIAGNOSIS — J3089 Other allergic rhinitis: Secondary | ICD-10-CM | POA: Diagnosis not present

## 2019-12-26 DIAGNOSIS — H6123 Impacted cerumen, bilateral: Secondary | ICD-10-CM

## 2019-12-26 DIAGNOSIS — J4540 Moderate persistent asthma, uncomplicated: Secondary | ICD-10-CM | POA: Diagnosis not present

## 2019-12-26 DIAGNOSIS — J4520 Mild intermittent asthma, uncomplicated: Secondary | ICD-10-CM | POA: Diagnosis not present

## 2019-12-26 DIAGNOSIS — J302 Other seasonal allergic rhinitis: Secondary | ICD-10-CM

## 2019-12-26 MED ORDER — AZELASTINE HCL 0.15 % NA SOLN: 1.0000 | Freq: Two times a day (BID) | NASAL | 5 refills | Status: DC | PRN

## 2019-12-26 MED ORDER — FLUTICASONE PROPIONATE 50 MCG/ACT NA SUSP: 2.0000 | Freq: Every day | NASAL | 5 refills | Status: DC

## 2019-12-26 NOTE — Telephone Encounter (Signed)
Please clarify message below to better assist pt. Thanks

## 2019-12-26 NOTE — Telephone Encounter (Signed)
Reason for Referral Request:    Has Patient been seen by PCP for this complaint? Patient states she has, multiple times   No, Please schedule patient for appointment for complaint.  Yes, Please find out following information:  Reason: Having hearing issues   Referral to which [facility]   Preferred office/ provider: [facility]

## 2019-12-26 NOTE — Assessment & Plan Note (Signed)
   May use debrox 5-10 drops in each ear twice a day for 4 days to loosen up the earwax.

## 2019-12-26 NOTE — Telephone Encounter (Signed)
Please also sched pt for appt. For acute reason separate from upcoming appt

## 2019-12-26 NOTE — Assessment & Plan Note (Addendum)
Past history - 2011 skin testing was positive to tree pollen, dust mites and one mold. Dymista too expensive Interim history - PND with coughing.   Continue environmental control measures.   Continue Singulair (montelukast) 10mg  daily.  May use Flonase (fluticasone) nasal spray 2 sprays per nostril once a day as needed for nasal congestion.   May use azelastine nasal spray 1-2 sprays per nostril twice a day as needed for runny nose/drainage.  May use over the counter antihistamines such as Zyrtec (cetirizine) daily as needed.

## 2019-12-26 NOTE — Assessment & Plan Note (Addendum)
Past history - Diagnosed with asthma over 20 years ago and was using Qvar 40 1-2 puffs QAM and albuterol with good benefit. However she gets flares and prednisone usually helps. Current flare started in December with no improvement after prednisone and azithromycin. Daily coughing with some brownish phlegm production. Recently started on zyrtec, and Singulair with unknown benefit. History of GERD but not on any medications. No COVID-19 diagnosis. Denies cardiac symptoms. 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 Chest x-ray showed no acute process. Breo not covered. Interim history - coughing the last 2 weeks feels like it's coming from her throat.   Today's spirometry showed some restriction with no improvement in FEV1 post bronchodilator treatment. Clinically feeling the same.   Coughing most likely from PND.  ACT score 16.  Take some Mucinex to loosen up the phlegm twice a day. Take with plenty of water.  ? Daily controller medication(s):continue with Symbicort 43mcg 2 puffs twice a day with spacer and rinse mouth afterwards. ? Spacer given and demonstrated proper use with inhaler. Patient understood technique and all questions/concerned were addressed.  ? May use albuterol rescue inhaler 2 puffs or nebulizer every 4 to 6 hours as needed for shortness of breath, chest tightness, coughing, and wheezing. May use albuterol rescue inhaler 2 puffs 5 to 15 minutes prior to strenuous physical activities. Monitor frequency of use.

## 2019-12-26 NOTE — Telephone Encounter (Signed)
  LAST APPOINTMENT DATE: 08/12/2019   NEXT APPOINTMENT DATE:@11 /10/2019  MEDICATION: fluticasone (VERAMYST) 27.5 MCG/SPRAY nasal spray  [facility]: [facility] [place], [place]  COMMENTS: Patient states that [facility] has faxed over a refill request twice with no response.

## 2019-12-26 NOTE — Telephone Encounter (Signed)
Patient was wanting a referral to an audiologist for hearing issues, states she already discussed with Dr.Kuneff in the past about the issue and was now ready to see an audiologist. Did call patient back to get scheduled for an appointment to discuss getting that referral.

## 2019-12-26 NOTE — Patient Instructions (Addendum)
Asthma:   Take some Mucinex to loosen up the phlegm twice a day. Take with plenty of water.  ? Daily controller medication(s):continue with Symbicort 20mcg 2 puffs twice a day with spacer and rinse mouth afterwards. ? Spacer given and demonstrated proper use with inhaler. Patient understood technique and all questions/concerned were addressed.  ? May use albuterol rescue inhaler 2 puffs or nebulizer every 4 to 6 hours as needed for shortness of breath, chest tightness, coughing, and wheezing. May use albuterol rescue inhaler 2 puffs 5 to 15 minutes prior to strenuous physical activities. Monitor frequency of use.  Asthma control goals:  Full participation in all desired activities (may need albuterol before activity) Albuterol use two times or less a week on average (not counting use with activity) Cough interfering with sleep two times or less a month Oral steroids no more than once a year No hospitalizations  Allergic rhinitis:  2021 allergy testing was positive to tree pollen, dust mites and one mold.   Continue environmental control measures.   Continue Singulair (montelukast) 10mg  daily.  May use Flonase (fluticasone) nasal spray 2 sprays per nostril once a day as needed for nasal congestion.   May use azelastine nasal spray 1-2 sprays per nostril twice a day as needed for runny nose/drainage.  May use over the counter antihistamines such as Zyrtec (cetirizine) daily as needed.  Ear wax:  May use debrox 5-10 drops in each ear twice a day for 4 days to loosen up the earwax.  Follow up in 4 months or sooner if needed.   Reducing Pollen Exposure . Pollen seasons: trees (spring), grass (summer) and ragweed/weeds (fall). Marland Kitchen Keep windows closed in your home and car to lower pollen exposure.  Susa Simmonds air conditioning in the bedroom and throughout the house if possible.  . Avoid going out in dry windy days - especially early morning. . Pollen counts are highest between 5 - 10 AM  and on dry, hot and windy days.  . Save outside activities for late afternoon or after a heavy rain, when pollen levels are lower.  . Avoid mowing of grass if you have grass pollen allergy. Marland Kitchen Be aware that pollen can also be transported indoors on people and pets.  . Dry your clothes in an automatic dryer rather than hanging them outside where they might collect pollen.  . Rinse hair and eyes before bedtime.  Control of House Dust Mite Allergen . Dust mite allergens are a common trigger of allergy and asthma symptoms. While they can be found throughout the house, these microscopic creatures thrive in warm, humid environments such as bedding, upholstered furniture and carpeting. . Because so much time is spent in the bedroom, it is essential to reduce mite levels there.  . Encase pillows, mattresses, and box springs in special allergen-proof fabric covers or airtight, zippered plastic covers.  . Bedding should be washed weekly in hot water (130 F) and dried in a hot dryer. Allergen-proof covers are available for comforters and pillows that can't be regularly washed.  Wendee Copp the allergy-proof covers every few months. Minimize clutter in the bedroom. Keep pets out of the bedroom.  Marland Kitchen Keep humidity less than 50% by using a dehumidifier or air conditioning. You can buy a humidity measuring device called a hygrometer to monitor this.  . If possible, replace carpets with hardwood, linoleum, or washable area rugs. If that's not possible, vacuum frequently with a vacuum that has a HEPA filter. . Remove all upholstered  furniture and non-washable window drapes from the bedroom. . Remove all non-washable stuffed toys from the bedroom.  Wash stuffed toys weekly.  Mold Control . Mold and fungi can grow on a variety of surfaces provided certain temperature and moisture conditions exist.  . Outdoor molds grow on plants, decaying vegetation and soil. The major outdoor mold, Alternaria and Cladosporium, are found  in very high numbers during hot and dry conditions. Generally, a late summer - fall peak is seen for common outdoor fungal spores. Rain will temporarily lower outdoor mold spore count, but counts rise rapidly when the rainy period ends. . The most important indoor molds are Aspergillus and Penicillium. Dark, humid and poorly ventilated basements are ideal sites for mold growth. The next most common sites of mold growth are the bathroom and the kitchen. Outdoor (Seasonal) Mold Control . Use air conditioning and keep windows closed. . Avoid exposure to decaying vegetation. Marland Kitchen Avoid leaf raking. . Avoid grain handling. . Consider wearing a face mask if working in moldy areas.  Indoor (Perennial) Mold Control  . Maintain humidity below 50%. . Get rid of mold growth on hard surfaces with water, detergent and, if necessary, 5% bleach (do not mix with other cleaners). Then dry the area completely. If mold covers an area more than 10 square feet, consider hiring an indoor environmental professional. . For clothing, washing with soap and water is best. If moldy items cannot be cleaned and dried, throw them away. . Remove sources e.g. contaminated carpets. . Repair and seal leaking roofs or pipes. Using dehumidifiers in damp basements may be helpful, but empty the water and clean units regularly to prevent mildew from forming. All rooms, especially basements, bathrooms and kitchens, require ventilation and cleaning to deter mold and mildew growth. Avoid carpeting on concrete or damp floors, and storing items in damp areas.

## 2019-12-27 NOTE — Telephone Encounter (Signed)
Dr.Kim refilled medication yesterday for 16g with 5 refills.

## 2019-12-27 NOTE — Telephone Encounter (Signed)
Patient made aware refills sent for azelastine and fluticasone nasal sprays sent in by Dr.Kim yesterday.

## 2020-01-10 ENCOUNTER — Encounter: Payer: Self-pay | Admitting: Family Medicine

## 2020-01-10 ENCOUNTER — Other Ambulatory Visit: Payer: Self-pay

## 2020-01-10 ENCOUNTER — Ambulatory Visit (INDEPENDENT_AMBULATORY_CARE_PROVIDER_SITE_OTHER): Payer: Medicare HMO | Admitting: Family Medicine

## 2020-01-10 VITALS — BP 104/68 | HR 84 | Temp 98.0°F

## 2020-01-10 DIAGNOSIS — Z23 Encounter for immunization: Secondary | ICD-10-CM | POA: Diagnosis not present

## 2020-01-10 DIAGNOSIS — H9193 Unspecified hearing loss, bilateral: Secondary | ICD-10-CM | POA: Diagnosis not present

## 2020-01-10 NOTE — Progress Notes (Signed)
This visit occurred during the SARS-CoV-2 public health emergency.  Safety protocols were in place, including screening questions prior to the visit, additional usage of staff PPE, and extensive cleaning of exam room while observing appropriate contact time as indicated for disinfecting solutions.    Robin Banks , 1955/07/02, 64 y.o., female MRN: [id number] Patient Care Team    Relationship Specialty Notifications Start End  Ma Hillock, DO PCP - General Family Medicine  10/26/15   Arvella Nigh, MD Consulting Physician Obstetrics and Gynecology  10/28/15   Tanda Rockers, MD Consulting Physician Pulmonary Disease  10/28/15     Chief Complaint  Patient presents with  . Hearing Problem    pt seen by audiologist but wants to switch due to cost ; wants to go to [facility]     Subjective: Pt presents for an OV with complaints of worsening hearing loss of > 5 years duration.  She had seen an audiologist about 5 years ago and was told she needed hearing aids at that time. Unfortunately, it was not financially feasible for her at that time. Her hearing has continued to worsen and she feels she would liked to get retested at [facility] for hearing aids   Depression screen [facility] 2/9 12/05/2018 06/04/2018 12/04/2017 04/06/2017 01/30/2017  Decreased Interest 0 0 0 0 2  Down, Depressed, Hopeless 1 1 1  0 2  PHQ - 2 Score 1 1 1  0 4  Altered sleeping 0 3 0 0 0  Tired, decreased energy 0 1 2 0 1  Change in appetite 0 0 0 0 0  Feeling bad or failure about yourself  0 0 0 0 0  Trouble concentrating 0 0 0 0 0  Moving slowly or fidgety/restless 0 0 0 0 0  Suicidal thoughts 0 0 0 0 0  PHQ-9 Score 1 5 3  0 5  Difficult doing work/chores Not difficult at all Somewhat difficult Not difficult at all Not difficult at all Somewhat difficult    Allergies  Allergen Reactions  . Demerol [Meperidine Hcl] Nausea And Vomiting  . Nitrofurantoin Nausea Only    swelling   Social History   Social History  Narrative   Married to [place]). Three children [name] and [name].    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    Takes herbal remedies, daily vitamin,    Wears seatbelt   Exercises 3x a week   Smoke detector in the home.    Firearms in the home (locked)   Feels safe in relationship.    Past Medical History:  Diagnosis Date  . Allergy   . Angio-edema   . Asthma   . Cataract   . Chicken pox   . Compression fracture of thoracic spine, non-traumatic (HCC)    T7-8  . Depression   . GERD (gastroesophageal reflux disease)   . Hyperlipidemia    no meds taken  . Insomnia   . Menopause   . Osteopenia   . Overactive bladder   . TMJ (temporomandibular joint syndrome)   . Urinary incontinence    Past Surgical History:  Procedure Laterality Date  . BLADDER SUSPENSION  2011   Dr. Matilde Sprang  . CARPAL TUNNEL RELEASE Bilateral 2014  . COLONOSCOPY    . TUBAL LIGATION Bilateral 1978   Family History  Problem Relation Age of Onset  . Hypertension Father   . CAD Father   . Hearing loss Father   . Heart disease Father   .  COPD Father   . Hyperlipidemia Mother   . Arthritis Mother   . Alcohol abuse Paternal Grandfather   . Heart attack Paternal Grandfather   . CAD Paternal Grandfather   . Dementia Paternal Grandmother   . Aortic aneurysm Maternal Grandmother   . Cerebral palsy Son        Mild CP by records  . Kidney nephrosis Son        FSG, kidney transplant.   . Colon cancer Neg Hx   . Esophageal cancer Neg Hx   . Stomach cancer Neg Hx   . Rectal cancer Neg Hx   . Allergic rhinitis Neg Hx   . Asthma Neg Hx   . Eczema Neg Hx   . Urticaria Neg Hx    Allergies as of 01/10/2020      Reactions   Demerol [meperidine Hcl] Nausea And Vomiting   Nitrofurantoin Nausea Only   swelling      Medication List       Accurate as of January 10, 2020  4:18 PM. If you have any questions, ask your nurse or doctor.        albuterol 108 (90 Base) MCG/ACT inhaler Commonly known as:  VENTOLIN HFA Inhale 1-2 puffs into the lungs every 6 (six) hours as needed for wheezing or shortness of breath.   Azelastine HCl 0.15 % Soln Place 1-2 sprays into the nose 2 (two) times daily as needed (runny nose, post nasal drip).   budesonide-formoterol 80-4.5 MCG/ACT inhaler Commonly known as: Symbicort Inhale 2 puffs into the lungs in the morning and at bedtime. Rinse mouth after each use   CALCIUM PLUS VITAMIN D3 PO Take by mouth daily.   fluticasone 50 MCG/ACT nasal spray Commonly known as: Flonase Place 2 sprays into both nostrils daily.   montelukast 10 MG tablet Commonly known as: SINGULAIR Take 1 tablet (10 mg total) by mouth at bedtime.   MULTIVITAMIN PO Take by mouth daily.   Red Yeast Rice 600 MG Caps Take 2 capsules by mouth daily.   traZODone 150 MG tablet Commonly known as: DESYREL Take 1 tablet (150 mg total) by mouth at bedtime.       All past medical history, surgical history, allergies, family history, immunizations andmedications were updated in the EMR today and reviewed under the history and medication portions of their EMR.     ROS: Negative, with the exception of above mentioned in HPI   Objective:  BP 104/68   Pulse 84   Temp 98 F (36.7 C) (Oral)  There is no height or weight on file to calculate BMI. Gen: Afebrile. No acute distress. Nontoxic in appearance, well developed, well nourished.  HENT: AT. [place]. Eyes:Pupils Equal Round Reactive to light, Extraocular movements intact,  Conjunctiva without redness, discharge or icterus. EAC with a fair amount of cerumen- neither impacted.  CV: RRR Chest: CTAB Neuro:  Normal gait. PERLA. EOMi. Alert. Oriented x3 Psych: Normal affect, dress and demeanor. Normal speech. Normal thought content and judgment.   Hearing Screening   125Hz  250Hz  500Hz  1000Hz  2000Hz  3000Hz  4000Hz  6000Hz  8000Hz   Right ear:   20 40 40  Fail    Left ear:   40 Fail Fail  Fail     No results found. No results found for  this or any previous visit (from the past 24 hour(s)).  Assessment/Plan: Robin Banks is a 64 y.o. female present for OV for  Need for influenza vaccination - Flu Vaccine QUAD High Dose(Fluad)  Bilateral hearing loss, unspecified hearing loss type Significant hearing loss bilateral ears with her left hearing severe. She does have some cerumen in her EAC bilaterally. Encouraged her to use her debrox routinely to allow for an accurate test with audiology. She is interested in hearing aids.  - Ambulatory referral to Audiology   Reviewed expectations re: course of current medical issues.  Discussed self-management of symptoms.  Outlined signs and symptoms indicating need for more acute intervention.  Patient verbalized understanding and all questions were answered.  Patient received an After-Visit Summary.    Orders Placed This Encounter  Procedures  . Flu Vaccine QUAD High Dose(Fluad)  . Ambulatory referral to Audiology   No orders of the defined types were placed in this encounter.   Referral Orders     Ambulatory referral to Audiology   Note is dictated utilizing voice recognition software. Although note has been proof read prior to signing, occasional typographical errors still can be missed. If any questions arise, please do not hesitate to call for verification.   electronically signed by:  Howard Pouch, DO  [place]

## 2020-01-10 NOTE — Patient Instructions (Signed)
Hearing Loss Hearing loss is a partial or total loss of the ability to hear. This can be temporary or permanent, and it can happen in one or both ears. Medical care is necessary to treat hearing loss properly and to prevent the condition from getting worse. Your hearing may partially or completely come back, depending on what caused your hearing loss and how severe it is. In some cases, hearing loss is permanent. What are the causes? Common causes of hearing loss include:  Too much wax in the ear canal.  Infection of the ear canal or middle ear.  Fluid in the middle ear.  Injury to the ear or surrounding area.  An object stuck in the ear.  A history of prolonged exposure to loud sounds, such as music. Less common causes of hearing loss include:  Tumors in the ear.  Viral or bacterial infections, such as meningitis.  A hole in the eardrum (perforated eardrum).  Problems with the hearing nerve that sends signals between the brain and the ear.  Certain medicines. What are the signs or symptoms? Symptoms of this condition may include:  Difficulty telling the difference between sounds.  Difficulty following a conversation when there is background noise.  Lack of response to sounds in your environment. This may be most noticeable when you do not respond to startling sounds.  Needing to turn up the volume on the television, radio, or other devices.  Ringing in the ears.  Dizziness. How is this diagnosed? This condition is diagnosed based on:  A physical exam.  A hearing test (audiometry). The audiometry test will be performed by a hearing specialist (audiologist). You may also be referred to an ear, nose, and throat (ENT) specialist (otolaryngologist). How is this treated? Treatment for hearing loss may include:  Ear wax removal.  Medicines to treat or prevent infection (antibiotics).  Medicines to reduce inflammation (corticosteroids).  Hearing aids for hearing  loss related to nerve damage. Follow these instructions at home:  If you were prescribed an antibiotic medicine, take it as told by your health care provider. Do not stop taking the antibiotic even if you start to feel better.  Take over-the-counter and prescription medicines only as told by your health care provider.  Avoid loud noises.  Return to your normal activities as told by your health care provider. Ask your health care provider what activities are safe for you.  Keep all follow-up visits as told by your health care provider. This is important. Contact a health care provider if:  You feel dizzy.  You develop new symptoms.  You vomit or feel nauseous.  You have a fever. Get help right away if:  You develop sudden changes in your vision.  You have severe ear pain.  You have new or increased weakness.  You have a severe headache. Summary  Hearing loss is a decreased ability to hear sounds around you. It can be temporary or permanent.  Treatment will depend on the cause of your hearing loss. It may include ear wax removal, medicines, or a hearing aid.  Your hearing may partially or completely come back, depending on what caused your hearing loss and how severe it is.  Keep all follow-up visits as told by your health care provider. This is important. This information is not intended to replace advice given to you by your health care provider. Make sure you discuss any questions you have with your health care provider. Document Revised: 12/12/2017 Document Reviewed: 12/12/2017 Elsevier Patient Education    2020 Elsevier Inc.  

## 2020-01-14 ENCOUNTER — Telehealth: Payer: Self-pay | Admitting: Allergy

## 2020-01-14 MED ORDER — PREDNISONE 10 MG PO TABS: ORAL_TABLET | ORAL | 0 refills | Status: DC

## 2020-01-14 NOTE — Telephone Encounter (Signed)
I will send in a prednisone taper.  If no improvement with the phlegm in a few days then may need to add on antibiotics as well.

## 2020-01-14 NOTE — Telephone Encounter (Signed)
Please advice . Thank you

## 2020-01-14 NOTE — Addendum Note (Signed)
Addended by: Kavin Leech on: 01/14/2020 01:24 PM   Modules accepted: Orders

## 2020-01-14 NOTE — Telephone Encounter (Signed)
Left a message for patient to call the office in regards to this matter. 

## 2020-01-14 NOTE — Telephone Encounter (Signed)
Patient called back and I notified her of Dr.Kim's message. Patient will call back in a few days with an update.

## 2020-01-14 NOTE — Telephone Encounter (Signed)
Patient states she was seen for her cough and congestion last week, but she is not feeling any better, in fact she feels a little worse. Patient states she has tried the new regimen that Dr. Maudie Mercury recommended, but it is not working. Patient would like to know if something else can be called in. Uses [facility] on [address].  Please advise.

## 2020-02-03 ENCOUNTER — Ambulatory Visit (INDEPENDENT_AMBULATORY_CARE_PROVIDER_SITE_OTHER): Payer: Medicare HMO | Admitting: Family Medicine

## 2020-02-03 ENCOUNTER — Other Ambulatory Visit: Payer: Self-pay

## 2020-02-03 ENCOUNTER — Encounter: Payer: Self-pay | Admitting: Family Medicine

## 2020-02-03 VITALS — BP 101/67 | HR 76 | Temp 98.3°F | Ht 61.0 in | Wt 119.0 lb

## 2020-02-03 DIAGNOSIS — F331 Major depressive disorder, recurrent, moderate: Secondary | ICD-10-CM

## 2020-02-03 DIAGNOSIS — R69 Illness, unspecified: Secondary | ICD-10-CM | POA: Diagnosis not present

## 2020-02-03 DIAGNOSIS — Z1159 Encounter for screening for other viral diseases: Secondary | ICD-10-CM

## 2020-02-03 DIAGNOSIS — M8580 Other specified disorders of bone density and structure, unspecified site: Secondary | ICD-10-CM | POA: Diagnosis not present

## 2020-02-03 DIAGNOSIS — G4700 Insomnia, unspecified: Secondary | ICD-10-CM | POA: Diagnosis not present

## 2020-02-03 DIAGNOSIS — Z130 Encounter for screening for diseases of the blood and blood-forming organs and certain disorders involving the immune mechanism: Secondary | ICD-10-CM

## 2020-02-03 DIAGNOSIS — Z13220 Encounter for screening for lipoid disorders: Secondary | ICD-10-CM

## 2020-02-03 DIAGNOSIS — Z23 Encounter for immunization: Secondary | ICD-10-CM | POA: Diagnosis not present

## 2020-02-03 DIAGNOSIS — R7309 Other abnormal glucose: Secondary | ICD-10-CM

## 2020-02-03 DIAGNOSIS — J3089 Other allergic rhinitis: Secondary | ICD-10-CM

## 2020-02-03 DIAGNOSIS — Z0000 Encounter for general adult medical examination without abnormal findings: Secondary | ICD-10-CM

## 2020-02-03 DIAGNOSIS — J4540 Moderate persistent asthma, uncomplicated: Secondary | ICD-10-CM | POA: Diagnosis not present

## 2020-02-03 DIAGNOSIS — F5101 Primary insomnia: Secondary | ICD-10-CM | POA: Diagnosis not present

## 2020-02-03 DIAGNOSIS — J302 Other seasonal allergic rhinitis: Secondary | ICD-10-CM

## 2020-02-03 LAB — COMPREHENSIVE METABOLIC PANEL
ALT: 30 U/L (ref 0–35)
AST: 25 U/L (ref 0–37)
Albumin: 3.9 g/dL (ref 3.5–5.2)
Alkaline Phosphatase: 69 U/L (ref 39–117)
BUN: 9 mg/dL (ref 6–23)
CO2: 30 mEq/L (ref 19–32)
Calcium: 9.3 mg/dL (ref 8.4–10.5)
Chloride: 99 mEq/L (ref 96–112)
Creatinine, Ser: 0.76 mg/dL (ref 0.40–1.20)
GFR: 81.7 mL/min (ref >60.00)
Glucose, Bld: 92 mg/dL (ref 70–99)
Potassium: 4 mEq/L (ref 3.5–5.1)
Sodium: 135 mEq/L (ref 135–145)
Total Bilirubin: 0.4 mg/dL (ref 0.2–1.2)
Total Protein: 6.5 g/dL (ref 6.0–8.3)

## 2020-02-03 LAB — CBC WITH DIFFERENTIAL/PLATELET
Basophils Absolute: 0 10*3/uL (ref 0.0–0.1)
Basophils Relative: 0.8 % (ref 0.0–3.0)
Eosinophils Absolute: 0.2 10*3/uL (ref 0.0–0.7)
Eosinophils Relative: 3.9 % (ref 0.0–5.0)
HCT: 39.3 % (ref 36.0–46.0)
Hemoglobin: 13.1 g/dL (ref 12.0–15.0)
Lymphocytes Relative: 29.5 % (ref 12.0–46.0)
Lymphs Abs: 1.2 10*3/uL (ref 0.7–4.0)
MCHC: 33.3 g/dL (ref 30.0–36.0)
MCV: 94.6 fl (ref 78.0–100.0)
Monocytes Absolute: 0.5 10*3/uL (ref 0.1–1.0)
Monocytes Relative: 12.1 % — ABNORMAL HIGH (ref 3.0–12.0)
Neutro Abs: 2.2 10*3/uL (ref 1.4–7.7)
Neutrophils Relative %: 53.7 % (ref 43.0–77.0)
Platelets: 276 10*3/uL (ref 150.0–400.0)
RBC: 4.16 Mil/uL (ref 3.87–5.11)
RDW: 13.1 % (ref 11.5–15.5)
WBC: 4.2 10*3/uL (ref 4.0–10.5)

## 2020-02-03 LAB — LIPID PANEL
Cholesterol: 208 mg/dL — ABNORMAL HIGH (ref 0–200)
HDL: 65.6 mg/dL (ref >39.00)
LDL Cholesterol: 124 mg/dL — ABNORMAL HIGH (ref 0–99)
NonHDL: 142.18
Total CHOL/HDL Ratio: 3
Triglycerides: 91 mg/dL (ref 0.0–149.0)
VLDL: 18.2 mg/dL (ref 0.0–40.0)

## 2020-02-03 LAB — HEMOGLOBIN A1C: Hgb A1c MFr Bld: 6.3 % (ref 4.6–6.5)

## 2020-02-03 LAB — TSH: TSH: 1.93 u[IU]/mL (ref 0.35–4.50)

## 2020-02-03 MED ORDER — TRAZODONE HCL 150 MG PO TABS: 150.0000 mg | ORAL_TABLET | Freq: Every day | ORAL | 1 refills | Status: DC

## 2020-02-03 MED ORDER — MONTELUKAST SODIUM 10 MG PO TABS
10.0000 mg | ORAL_TABLET | Freq: Every day | ORAL | 1 refills | Status: DC
Start: 2020-02-03 — End: 2020-07-31

## 2020-02-03 MED ORDER — ZOSTER VAC RECOMB ADJUVANTED 50 MCG/0.5ML IM SUSR: 0.5000 mL | Freq: Once | INTRAMUSCULAR | 0 refills | Status: AC

## 2020-02-03 MED ORDER — TETANUS-DIPHTH-ACELL PERTUSSIS 5-2.5-18.5 LF-MCG/0.5 IM SUSP: 0.5000 mL | Freq: Once | INTRAMUSCULAR | 0 refills | Status: AC

## 2020-02-03 NOTE — Patient Instructions (Signed)

## 2020-02-03 NOTE — Progress Notes (Signed)
This visit occurred during the SARS-CoV-2 public health emergency.  Safety protocols were in place, including screening questions prior to the visit, additional usage of staff PPE, and extensive cleaning of exam room while observing appropriate contact time as indicated for disinfecting solutions.    Patient ID: Robin Banks, female  DOB: 1956-02-16, 64 y.o.   MRN: [id number] Patient Care Team    Relationship Specialty Notifications Start End  Ma Hillock, DO PCP - General Family Medicine  10/26/15   Arvella Nigh, MD Consulting Physician Obstetrics and Gynecology  10/28/15   Tanda Rockers, MD Consulting Physician Pulmonary Disease  10/28/15     Chief Complaint  Patient presents with  . Annual Exam    pt is fasting    Subjective:  Robin Banks is a 64 y.o.  Female  present for CPE . All past medical history, surgical history, allergies, family history, immunizations, medications and social history were updated in the electronic medical record today. All recent labs, ED visits and hospitalizations within the last year were reviewed.  Health maintenance:  Colonoscopy: completed:05/23/2019- Dr. Loletha Carrow- 10 yr.  Mammogram: completed: mammogram and pelvicatGYN (Dr. Leatrice Jewels Comb). SBE routinely. Last mammogram 2021 Cervical cancer screening:see above, LMP at 47. Has GYN.  Immunizations: tdap2010> printed, InfluenzaUTD(encouraged yearly), PNA series2014- after 65 started today, zostavax2014> printed shingrix Infectious disease screening:waiting until medicare coverage.  DEXA:2021.-1.9 right femoral neck. Osteopenia rt hip, she takes calcium and vitamin D and weight bearing exercise Assistive device: none Oxygen NKN:LZJQ Patient has a Dental home. Hospitalizations/ED visits: reviewed  Insomnia:She reports use of trazodone 150 mg. No side effects. She is doing well on this medication.   Asthma/cough:   She is now been seen with by allergy and asthma. They prescribe her  symbicort and dymista (tried qvar and breo[too expensive]). She reports compliance  with singulair. She uses zyrtec when needed.    Depression screen [facility] 2/9 02/03/2020 02/03/2020 12/05/2018 06/04/2018 12/04/2017  Decreased Interest 0 0 0 0 0  Down, Depressed, Hopeless 0 0 1 1 1   PHQ - 2 Score 0 0 1 1 1   Altered sleeping 0 - 0 3 0  Tired, decreased energy 0 - 0 1 2  Change in appetite 0 - 0 0 0  Feeling bad or failure about yourself  0 - 0 0 0  Trouble concentrating 0 - 0 0 0  Moving slowly or fidgety/restless 0 - 0 0 0  Suicidal thoughts 0 - 0 0 0  PHQ-9 Score 0 - 1 5 3   Difficult doing work/chores - - Not difficult at all Somewhat difficult Not difficult at all   GAD 7 : Generalized Anxiety Score 12/05/2018 06/04/2018 04/06/2017 03/24/2016  Nervous, Anxious, on Edge 0 0 0 3  Control/stop worrying 0 0 0 1  Worry too much - different things 0 0 0 1  Trouble relaxing 1 0 0 2  Restless 0 0 0 0  Easily annoyed or irritable 0 0 0 1  Afraid - awful might happen 0 0 0 0  Total GAD 7 Score 1 0 0 8  Anxiety Difficulty Not difficult at all Somewhat difficult Not difficult at all Somewhat difficult    Immunization History  Administered Date(s) Administered  . Fluad Quad(high Dose 65+) 12/05/2018, 01/10/2020  . Influenza,inj,Quad PF,6+ Mos 03/24/2016, 01/24/2017, 12/04/2017  . Influenza-Unspecified 12/26/2013, 01/24/2017  . PFIZER SARS-COV-2 Vaccination 06/02/2019, 07/02/2019, 01/28/2020  . Pneumococcal Polysaccharide-23 11/20/2012  . Tdap 02/03/2009  . Zoster 03/15/2013  Past Medical History:  Diagnosis Date  . Allergy   . Angio-edema   . Asthma   . Cataract   . Chicken pox   . Compression fracture of thoracic spine, non-traumatic (HCC)    T7-8  . Depression   . GERD (gastroesophageal reflux disease)   . Hyperlipidemia    no meds taken  . Insomnia   . Menopause   . Osteopenia   . Overactive bladder   . TMJ (temporomandibular joint syndrome)   . Urinary incontinence    Allergies   Allergen Reactions  . Demerol [Meperidine Hcl] Nausea And Vomiting  . Nitrofurantoin Nausea Only    swelling   Past Surgical History:  Procedure Laterality Date  . BLADDER SUSPENSION  2011   Dr. Matilde Sprang  . CARPAL TUNNEL RELEASE Bilateral 2014  . COLONOSCOPY    . TUBAL LIGATION Bilateral 1978   Family History  Problem Relation Age of Onset  . Hypertension Father   . CAD Father   . Hearing loss Father   . Heart disease Father   . COPD Father   . Hyperlipidemia Mother   . Arthritis Mother   . Alcohol abuse Paternal Grandfather   . Heart attack Paternal Grandfather   . CAD Paternal Grandfather   . Dementia Paternal Grandmother   . Aortic aneurysm Maternal Grandmother   . Cerebral palsy Son        Mild CP by records  . Kidney nephrosis Son        FSG, kidney transplant.   . Colon cancer Neg Hx   . Esophageal cancer Neg Hx   . Stomach cancer Neg Hx   . Rectal cancer Neg Hx   . Allergic rhinitis Neg Hx   . Asthma Neg Hx   . Eczema Neg Hx   . Urticaria Neg Hx    Social History   Social History Narrative   Married to [place]). Three children [name] and [name].    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    Takes herbal remedies, daily vitamin,    Wears seatbelt   Exercises 3x a week   Smoke detector in the home.    Firearms in the home (locked)   Feels safe in relationship.     Allergies as of 02/03/2020      Reactions   Demerol [meperidine Hcl] Nausea And Vomiting   Nitrofurantoin Nausea Only   swelling      Medication List       Accurate as of February 03, 2020  8:33 AM. If you have any questions, ask your nurse or doctor.        STOP taking these medications   predniSONE 10 MG tablet Commonly known as: DELTASONE Stopped by: Howard Pouch, DO     TAKE these medications   albuterol 108 (90 Base) MCG/ACT inhaler Commonly known as: VENTOLIN HFA Inhale 1-2 puffs into the lungs every 6 (six) hours as needed for wheezing or shortness of breath.    Azelastine HCl 0.15 % Soln Place 1-2 sprays into the nose 2 (two) times daily as needed (runny nose, post nasal drip).   budesonide-formoterol 80-4.5 MCG/ACT inhaler Commonly known as: Symbicort Inhale 2 puffs into the lungs in the morning and at bedtime. Rinse mouth after each use   CALCIUM PLUS VITAMIN D3 PO Take by mouth daily.   fluticasone 50 MCG/ACT nasal spray Commonly known as: Flonase Place 2 sprays into both nostrils daily.   montelukast 10 MG tablet Commonly known as: SINGULAIR  Take 1 tablet (10 mg total) by mouth at bedtime.   MULTIVITAMIN PO Take by mouth daily.   Red Yeast Rice 600 MG Caps Take 2 capsules by mouth daily.   Tdap 5-2.5-18.5 LF-MCG/0.5 injection Commonly known as: BOOSTRIX Inject 0.5 mLs into the muscle once for 1 dose. Started by: Howard Pouch, DO   traZODone 150 MG tablet Commonly known as: DESYREL Take 1 tablet (150 mg total) by mouth at bedtime.   Zoster Vaccine Adjuvanted injection Commonly known as: SHINGRIX Inject 0.5 mLs into the muscle once for 1 dose. Started by: Howard Pouch, DO       All past medical history, surgical history, allergies, family history, immunizations andmedications were updated in the EMR today and reviewed under the history and medication portions of their EMR.     No results found for this or any previous visit (from the past 2160 hour(s)).   ROS: 14 pt review of systems performed and negative (unless mentioned in an HPI)  Objective: BP 101/67   Pulse 76   Temp 98.3 F (36.8 C) (Oral)   Ht 5\' 1"  (1.549 m)   Wt 119 lb (54 kg)   SpO2 100%   BMI 22.48 kg/m  Gen: Afebrile. No acute distress. Nontoxic in appearance, well-developed, well-nourished,  Very pleasant female.  HENT: AT. [place]. Bilateral TM visualized and normal in appearance, normal external auditory canal. MMM, no oral lesions, adequate dentition. Bilateral nares within normal limits. Throat without erythema, ulcerations or exudates. no Cough  on exam, no hoarseness on exam. Eyes:Pupils Equal Round Reactive to light, Extraocular movements intact,  Conjunctiva without redness, discharge or icterus. Neck/lymp/endocrine: Supple,no lymphadenopathy, no thyromegaly CV: RRR no murmur, no edema, +2/4 P posterior tibialis pulses.  Chest: CTAB, no wheeze, rhonchi or crackles. normal Respiratory effort. good Air movement. Abd: Soft. flat. NTND. BS present. no Masses palpated. No hepatosplenomegaly. No rebound tenderness or guarding. Skin: no rashes, purpura or petechiae. Warm and well-perfused. Skin intact. Neuro/Msk:  Normal gait. PERLA. EOMi. Alert. Oriented x3.  Cranial nerves II through XII intact. Muscle strength 5/5 upper/lower extremity. DTRs equal bilaterally. Psych: Normal affect, dress and demeanor. Normal speech. Normal thought content and judgment.  No exam data present  Assessment/plan: DAGNY FIORENTINO is a 64 y.o. female present for  CPE  Insomnia, unspecified type/Moderate recurrent major depression ([place]) - stable.  - TSH collected today - continue trazodone 150 mg Qhs - Follow-up every 5.5 months on current issues.  Moderate persistent asthma without complication/allergic rhinitis/seasomal allergies - stable.   - continue inhalers prescribed by A&A - continue singulair - continue OTC oral antihistamine PRN   Osteopenia, unspecified location followed by GYN DEXA UTD Elevated hemoglobin A1c - Comprehensive metabolic panel - Hemoglobin A1c Need for hepatitis C screening test - Hepatitis C antibody Lipid screening - Lipid panel Screening for deficiency anemia - CBC with Differential/Platelet Routine general medical examination at a health care facility Patient was encouraged to exercise greater than 150 minutes a week. Patient was encouraged to choose a diet filled with fresh fruits and vegetables, and lean meats. AVS provided to patient today for education/recommendation on gender specific health and safety  maintenance. Colonoscopy: completed:05/23/2019- Dr. Loletha Carrow- 32 yr.  Mammogram: completed: mammogram and pelvicatGYN (Dr. Leatrice Jewels Comb). SBE routinely. Last mammogram 2021 Cervical cancer screening:see above, LMP at 47. Has GYN.  Immunizations: tdap2010> printed, InfluenzaUTD(encouraged yearly), PNA series2014- after 63 started today, zostavax2014> printed shingrix Infectious disease screening:waiting until medicare coverage.  DEXA:2021.-1.9 right femoral neck.  Osteopenia rt hip, she takes calcium and vitamin D and weight bearing exercise Return in about 6 months (around 08/02/2020) for [facility] (30 min) and 1 year CPE.   Orders Placed This Encounter  Procedures  . CBC with Differential/Platelet  . Comprehensive metabolic panel  . Hemoglobin A1c  . Lipid panel  . TSH  . Hepatitis C antibody   Meds ordered this encounter  Medications  . montelukast (SINGULAIR) 10 MG tablet    Sig: Take 1 tablet (10 mg total) by mouth at bedtime.    Dispense:  90 tablet    Refill:  1  . traZODone (DESYREL) 150 MG tablet    Sig: Take 1 tablet (150 mg total) by mouth at bedtime.    Dispense:  90 tablet    Refill:  1  . Tdap (BOOSTRIX) 5-2.5-18.5 LF-MCG/0.5 injection    Sig: Inject 0.5 mLs into the muscle once for 1 dose.    Dispense:  0.5 mL    Refill:  0  . Zoster Vaccine Adjuvanted [facility]) injection    Sig: Inject 0.5 mLs into the muscle once for 1 dose.    Dispense:  0.5 mL    Refill:  0   Referral Orders  No referral(s) requested today     Electronically signed by: Howard Pouch, [place]

## 2020-02-04 LAB — HEPATITIS C ANTIBODY
Hepatitis C Ab: NONREACTIVE
SIGNAL TO CUT-OFF: 0 (ref <1.00)

## 2020-04-03 DIAGNOSIS — M654 Radial styloid tenosynovitis [de Quervain]: Secondary | ICD-10-CM | POA: Diagnosis not present

## 2020-04-07 ENCOUNTER — Telehealth: Payer: Self-pay | Admitting: Family Medicine

## 2020-04-07 DIAGNOSIS — H9193 Unspecified hearing loss, bilateral: Secondary | ICD-10-CM

## 2020-04-07 DIAGNOSIS — H6120 Impacted cerumen, unspecified ear: Secondary | ICD-10-CM

## 2020-04-07 NOTE — Telephone Encounter (Signed)
Referral placed.

## 2020-04-07 NOTE — Telephone Encounter (Signed)
Ok to place referral.

## 2020-04-07 NOTE — Telephone Encounter (Signed)
Patient requesting referral to ENT for ear wax removal. She states Dr. Maudie Mercury has tried twice with irrigation and that method has not worked.

## 2020-04-07 NOTE — Telephone Encounter (Signed)
Please advise. Pt has seen allergy and asthma for issue. Thanks

## 2020-04-07 NOTE — Addendum Note (Signed)
Addended by: Octaviano Glow on: 04/07/2020 02:14 PM   Modules accepted: Orders

## 2020-04-20 DIAGNOSIS — M654 Radial styloid tenosynovitis [de Quervain]: Secondary | ICD-10-CM | POA: Diagnosis not present

## 2020-04-21 NOTE — Progress Notes (Deleted)
Subjective:   Robin Banks is a 65 y.o. female who presents for an Initial Medicare Annual Wellness Visit.  I connected with Analeigh today by telephone and verified that I am speaking with the correct person using two identifiers. Location patient: home Location provider: work Persons participating in the virtual visit: patient, Marine scientist.    I discussed the limitations, risks, security and privacy concerns of performing an evaluation and management service by telephone and the availability of in person appointments. I also discussed with the patient that there may be a patient responsible charge related to this service. The patient expressed understanding and verbally consented to this telephonic visit.    Interactive audio and video telecommunications were attempted between this provider and patient, however failed, due to patient having technical difficulties OR patient did not have access to video capability.  We continued and completed visit with audio only.  Some vital signs may be absent or patient reported.   Time Spent with patient on telephone encounter: *** minutes   Review of Systems    ***       Objective:    There were no vitals filed for this visit. There is no height or weight on file to calculate BMI.  No flowsheet data found.  Current Medications (verified) Outpatient Encounter Medications as of 04/22/2020  Medication Sig  . albuterol (VENTOLIN HFA) 108 (90 Base) MCG/ACT inhaler Inhale 1-2 puffs into the lungs every 6 (six) hours as needed for wheezing or shortness of breath.  . Azelastine HCl 0.15 % SOLN Place 1-2 sprays into the nose 2 (two) times daily as needed (runny nose, post nasal drip).  . budesonide-formoterol (SYMBICORT) 80-4.5 MCG/ACT inhaler Inhale 2 puffs into the lungs in the morning and at bedtime. Rinse mouth after each use  . Calcium Carb-Cholecalciferol (CALCIUM PLUS VITAMIN D3 PO) Take by mouth daily.  . fluticasone (FLONASE) 50 MCG/ACT nasal  spray Place 2 sprays into both nostrils daily.  . montelukast (SINGULAIR) 10 MG tablet Take 1 tablet (10 mg total) by mouth at bedtime.  . Multiple Vitamin (MULTIVITAMIN PO) Take by mouth daily.  . Red Yeast Rice 600 MG CAPS Take 2 capsules by mouth daily. (Patient not taking: Reported on 02/03/2020)  . traZODone (DESYREL) 150 MG tablet Take 1 tablet (150 mg total) by mouth at bedtime.   No facility-administered encounter medications on file as of 04/22/2020.    Allergies (verified) Demerol [meperidine hcl] and Nitrofurantoin   History: Past Medical History:  Diagnosis Date  . Allergy   . Angio-edema   . Asthma   . Cataract   . Chicken pox   . Compression fracture of thoracic spine, non-traumatic (HCC)    T7-8  . Depression   . GERD (gastroesophageal reflux disease)   . Hyperlipidemia    no meds taken  . Insomnia   . Menopause   . Osteopenia   . Overactive bladder   . TMJ (temporomandibular joint syndrome)   . Urinary incontinence    Past Surgical History:  Procedure Laterality Date  . BLADDER SUSPENSION  2011   Dr. Matilde Sprang  . CARPAL TUNNEL RELEASE Bilateral 2014  . COLONOSCOPY    . TUBAL LIGATION Bilateral 1978   Family History  Problem Relation Age of Onset  . Hypertension Father   . CAD Father   . Hearing loss Father   . Heart disease Father   . COPD Father   . Hyperlipidemia Mother   . Arthritis Mother   . Alcohol  abuse Paternal Grandfather   . Heart attack Paternal Grandfather   . CAD Paternal Grandfather   . Dementia Paternal Grandmother   . Aortic aneurysm Maternal Grandmother   . Cerebral palsy Son        Mild CP by records  . Kidney nephrosis Son        FSG, kidney transplant.   . Colon cancer Neg Hx   . Esophageal cancer Neg Hx   . Stomach cancer Neg Hx   . Rectal cancer Neg Hx   . Allergic rhinitis Neg Hx   . Asthma Neg Hx   . Eczema Neg Hx   . Urticaria Neg Hx    Social History   Socioeconomic History  . Marital status: Married     Spouse name: Not on file  . Number of children: Not on file  . Years of education: Not on file  . Highest education level: Not on file  Occupational History  . Not on file  Tobacco Use  . Smoking status: Never Smoker  . Smokeless tobacco: Never Used  Vaping Use  . Vaping Use: Never used  Substance and Sexual Activity  . Alcohol use: Yes    Alcohol/week: 1.0 standard drink    Types: 1 Glasses of wine per week  . Drug use: No  . Sexual activity: Yes    Partners: Male    Comment: married  Other Topics Concern  . Not on file  Social History Narrative   Married to [facility]). Three children Biagio Borg and Madelaine Bhat.    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    Takes herbal remedies, daily vitamin,    Wears seatbelt   Exercises 3x a week   Smoke detector in the home.    Firearms in the home (locked)   Feels safe in relationship.    Social Determinants of Health   Financial Resource Strain: Not on file  Food Insecurity: Not on file  Transportation Needs: Not on file  Physical Activity: Not on file  Stress: Not on file  Social Connections: Not on file    Tobacco Counseling Counseling given: Not Answered   Clinical Intake:                 Diabetic?No         Activities of Daily Living No flowsheet data found.  Patient Care Team: Natalia Leatherwood, DO as PCP - General (Family Medicine) Richardean Chimera, MD as Consulting Physician (Obstetrics and Gynecology) Nyoka Cowden, MD as Consulting Physician (Pulmonary Disease)  Indicate any recent Medical Services you may have received from other than [facility] providers in the past year (date may be approximate).     Assessment:   This is a routine wellness examination for Robin Banks.  Hearing/Vision screen No exam data present  Dietary issues and exercise activities discussed:    Goals   None    Depression Screen PHQ 2/9 Scores 02/03/2020 02/03/2020 12/05/2018 06/04/2018 12/04/2017 04/06/2017 01/30/2017  PHQ - 2 Score 0 0 1 1  1  0 4  PHQ- 9 Score 0 - 1 5 3  0 5    Fall Risk Fall Risk  01/10/2020 12/05/2018 10/26/2015  Falls in the past year? 0 0 No  Number falls in past yr: 0 - -  Injury with Fall? 0 - -  Follow up - Falls evaluation completed;Education provided;Falls prevention discussed -    FALL RISK PREVENTION PERTAINING TO THE HOME:  Any stairs in or around the home? {YES/NO:21197} If so,  are there any without handrails? {YES/NO:21197} Home free of loose throw rugs in walkways, pet beds, electrical cords, etc? {YES/NO:21197} Adequate lighting in your home to reduce risk of falls? {YES/NO:21197}  ASSISTIVE DEVICES UTILIZED TO PREVENT FALLS:  Life alert? {YES/NO:21197} Use of a cane, walker or w/c? {YES/NO:21197} Grab bars in the bathroom? {YES/NO:21197} Shower chair or bench in shower? {YES/NO:21197} Elevated toilet seat or a handicapped toilet? {YES/NO:21197}  TIMED UP AND GO:  Was the test performed? {YES/NO:21197}.  Length of time to ambulate 10 feet: *** sec.   {Appearance of XIPJ:8250539}  Cognitive Function:        Immunizations Immunization History  Administered Date(s) Administered  . Fluad Quad(high Dose 65+) 12/05/2018, 01/10/2020  . Influenza,inj,Quad PF,6+ Mos 03/24/2016, 01/24/2017, 12/04/2017  . Influenza-Unspecified 12/26/2013, 01/24/2017  . PFIZER(Purple Top)SARS-COV-2 Vaccination 06/02/2019, 07/02/2019, 01/28/2020  . Pneumococcal Conjugate-13 02/03/2020  . Pneumococcal Polysaccharide-23 11/20/2012  . Tdap 02/03/2009  . Zoster 03/15/2013    TDAP status: Due, Education has been provided regarding the importance of this vaccine. Advised may receive this vaccine at local pharmacy or Health Dept. Aware to provide a copy of the vaccination record if obtained from local pharmacy or Health Dept. Verbalized acceptance and understanding.  Flu Vaccine status: Up to date  Pneumococcal vaccine status: Up to date  Covid-19 vaccine status: Completed vaccines  Qualifies for  Shingles Vaccine? Yes   Zostavax completed Yes   Shingrix Completed?: No.    Education has been provided regarding the importance of this vaccine. Patient has been advised to call insurance company to determine out of pocket expense if they have not yet received this vaccine. Advised may also receive vaccine at local pharmacy or Health Dept. Verbalized acceptance and understanding.  Screening Tests Health Maintenance  Topic Date Due  . TETANUS/TDAP  02/04/2019  . MAMMOGRAM  03/28/2020  . PNA vac Low Risk Adult (2 of 2 - PPSV23) 02/02/2021  . DEXA SCAN  03/28/2024  . COLONOSCOPY (Pts 45-10yrs Insurance coverage will need to be confirmed)  05/22/2029  . INFLUENZA VACCINE  Completed  . COVID-19 Vaccine  Completed  . Hepatitis C Screening  Completed    Health Maintenance  Health Maintenance Due  Topic Date Due  . TETANUS/TDAP  02/04/2019  . MAMMOGRAM  03/28/2020    Colorectal cancer screening: Type of screening: Colonoscopy. Completed 05/23/2019. Repeat every 10 years  {Mammogram status:21018020}  Bone Density status: Completed 03/29/2019-No report available  Lung Cancer Screening: (Low Dose CT Chest recommended if Age 51-80 years, 30 pack-year currently smoking OR have quit w/in 15years.) does not qualify.     Additional Screening:  Hepatitis C Screening:  Completed 02/03/2020  Vision Screening: Recommended annual ophthalmology exams for early detection of glaucoma and other disorders of the eye. Is the patient up to date with their annual eye exam?  {YES/NO:21197} Who is the provider or what is the name of the office in which the patient attends annual eye exams? *** If pt is not established with a provider, would they like to be referred to a provider to establish care? {YES/NO:21197}.   Dental Screening: Recommended annual dental exams for proper oral hygiene  Community Resource Referral / Chronic Care Management: CRR required this visit?  {YES/NO:21197}  CCM required  this visit?  {YES/NO:21197}     Plan:     I have personally reviewed and noted the following in the patient's chart:   . Medical and social history . Use of alcohol, tobacco or illicit drugs  .  Current medications and supplements . Functional ability and status . Nutritional status . Physical activity . Advanced directives . List of other physicians . Hospitalizations, surgeries, and ER visits in previous 12 months . Vitals . Screenings to include cognitive, depression, and falls . Referrals and appointments  In addition, I have reviewed and discussed with patient certain preventive protocols, quality metrics, and best practice recommendations. A written personalized care plan for preventive services as well as general preventive health recommendations were provided to patient.   Due to this being a telephonic visit, the after visit summary with patients personalized plan was offered to patient via mail or my-chart. ***Patient declined at this time./ Patient would like to access on my-chart/ per request, patient was mailed a copy of AVS./ Patient preferred to pick up at office at next visit.   Marta Antu, LPN   [id number]  Nurse Health Advisor  Nurse Notes: ***

## 2020-04-22 ENCOUNTER — Encounter: Payer: Self-pay | Admitting: Allergy

## 2020-04-22 ENCOUNTER — Encounter: Payer: Self-pay | Admitting: Family Medicine

## 2020-04-22 ENCOUNTER — Ambulatory Visit: Payer: Medicare HMO

## 2020-04-28 ENCOUNTER — Ambulatory Visit: Payer: Medicare HMO | Admitting: Allergy

## 2020-04-28 ENCOUNTER — Ambulatory Visit (INDEPENDENT_AMBULATORY_CARE_PROVIDER_SITE_OTHER): Payer: Medicare HMO | Admitting: Otolaryngology

## 2020-04-28 ENCOUNTER — Other Ambulatory Visit: Payer: Self-pay

## 2020-04-28 ENCOUNTER — Encounter (INDEPENDENT_AMBULATORY_CARE_PROVIDER_SITE_OTHER): Payer: Self-pay | Admitting: Otolaryngology

## 2020-04-28 VITALS — Temp 97.2°F

## 2020-04-28 DIAGNOSIS — H903 Sensorineural hearing loss, bilateral: Secondary | ICD-10-CM | POA: Diagnosis not present

## 2020-04-28 DIAGNOSIS — H6123 Impacted cerumen, bilateral: Secondary | ICD-10-CM

## 2020-04-28 NOTE — Progress Notes (Signed)
HPI: Robin Banks is a 65 y.o. female who presents is referred by her PCP Dr. Raoul Banks For evaluation of wax buildup in her ears.  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  On recent exam she had a large amount of wax buildup in both ear canals..  Past Medical History:  Diagnosis Date  . Allergy   . Angio-edema   . Asthma   . Cataract   . Chicken pox   . Compression fracture of thoracic spine, non-traumatic (HCC)    T7-8  . Depression   . GERD (gastroesophageal reflux disease)   . Hyperlipidemia    no meds taken  . Insomnia   . Menopause   . Osteopenia   . Overactive bladder   . TMJ (temporomandibular joint syndrome)   . Urinary incontinence    Past Surgical History:  Procedure Laterality Date  . BLADDER SUSPENSION  2011   Dr. Matilde Banks  . CARPAL TUNNEL RELEASE Bilateral 2014  . COLONOSCOPY    . TUBAL LIGATION Bilateral 1978   Social History   Socioeconomic History  . Marital status: Married    Spouse name: Not on file  . Number of children: Not on file  . Years of education: Not on file  . Highest education level: Not on file  Occupational History  . Not on file  Tobacco Use  . Smoking status: Never Smoker  . Smokeless tobacco: Never Used  Vaping Use  . Vaping Use: Never used  Substance and Sexual Activity  . Alcohol use: Yes    Alcohol/week: 1.0 standard drink    Types: 1 Glasses of wine per week  . Drug use: No  . Sexual activity: Yes    Partners: Male    Comment: married  Other Topics Concern  . Not on file  Social History Narrative   Married to Robin Banks). Three children Robin Banks and Robin Banks.    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    Takes herbal remedies, daily vitamin,    Wears seatbelt   Exercises 3x a week   Smoke detector in the home.    Firearms in the home (locked)   Feels safe in relationship.     Social Determinants of Health   Financial Resource Strain: Not on file  Food Insecurity: Not on file  Transportation Needs: Not on file  Physical Activity: Not on file  Stress: Not on file  Social Connections: Not on file   Family History  Problem Relation Age of Onset  . Hypertension Father   . CAD Father   . Hearing loss Father   . Heart disease Father   . COPD Father   . Hyperlipidemia Mother   . Arthritis Mother   . Alcohol abuse Paternal Grandfather   . Heart attack Paternal Grandfather   . CAD Paternal Grandfather   . Dementia Paternal Grandmother   . Aortic aneurysm Maternal Grandmother   . Cerebral palsy Son        Mild CP by records  . Kidney nephrosis Son        FSG, kidney transplant.   . Colon cancer Neg Hx   . Esophageal cancer Neg Hx   . Stomach cancer Neg Hx   . Rectal cancer Neg Hx   . Allergic rhinitis Neg Hx   . Asthma Neg Hx   . Eczema Neg Hx   .  Urticaria Neg Hx    Allergies  Allergen Reactions  . Robin Banks [Meperidine Hcl] Nausea And Vomiting  . Nitrofurantoin Nausea Only    swelling   Prior to Admission medications   Medication Sig Start Date End Date Taking? Authorizing Provider  albuterol (VENTOLIN HFA) 108 (90 Base) MCG/ACT inhaler Inhale 1-2 puffs into the lungs every 6 (six) hours as needed for wheezing or shortness of breath. 08/12/19   Kuneff, Renee A, DO  Azelastine HCl 0.15 % SOLN Place 1-2 sprays into the nose 2 (two) times daily as needed (runny nose, post nasal drip). 12/26/19   Garnet Sierras, DO  budesonide-formoterol (SYMBICORT) 80-4.5 MCG/ACT inhaler Inhale 2 puffs into the lungs in the morning and at bedtime. Rinse mouth after each use 08/09/19   Garnet Sierras, DO  Calcium Carb-Cholecalciferol (CALCIUM PLUS VITAMIN D3 PO) Take by mouth daily.    [provider]  fluticasone (FLONASE) 50 MCG/ACT nasal spray Place 2 sprays into both nostrils daily. 12/26/19   Garnet Sierras, DO  montelukast (SINGULAIR) 10 MG tablet Take 1 tablet (10  mg total) by mouth at bedtime. 02/03/20   Kuneff, Renee A, DO  Multiple Vitamin (MULTIVITAMIN PO) Take by mouth daily.    [provider]  Red Yeast Rice 600 MG CAPS Take 2 capsules by mouth daily. Patient not taking: Reported on 02/03/2020    [provider]  traZODone (DESYREL) 150 MG tablet Take 1 tablet (150 mg total) by mouth at bedtime. 02/03/20   Kuneff, Renee A, DO     Positive ROS: Otherwise negative  All other systems have been reviewed and were otherwise negative with the exception of those mentioned in the HPI and as above.  Physical Exam: Constitutional: Alert, well-appearing, no acute distress Ears: External ears without lesions or tenderness.  She has small ear canals bilaterally with a large amount of wax buildup in both ear canals.  This was cleaned with suction and curettes.  TMs were clear bilaterally with good mobility on pneumatic otoscopy. Nasal: External nose without lesions.. Clear nasal passages Oral: Lips and gums without lesions. Tongue and palate mucosa without lesions. Posterior oropharynx clear. Neck: No palpable adenopathy or masses Respiratory: Breathing comfortably  Skin: No facial/neck lesions or rash noted.  Cerumen impaction removal  Date/Time: 04/28/2020 5:41 PM Performed by: Rozetta Nunnery, MD Authorized by: Rozetta Nunnery, MD   Consent:    Consent obtained:  Verbal   Consent given by:  Patient   Risks discussed:  Pain and bleeding Procedure details:    Location:  L ear and R ear   Procedure type: curette and suction   Post-procedure details:    Inspection:  TM intact and canal normal   Hearing quality:  Improved   Patient tolerance of procedure:  Tolerated well, no immediate complications Comments:     TMs are clear bilaterally.    Assessment: Patient with small ear canals bilaterally with a large amount of wax that was cleaned with suction and curettes.  TMs were otherwise clear.  Plan: She will follow  up as needed.   Radene Journey, MD   CC:

## 2020-04-29 ENCOUNTER — Encounter (INDEPENDENT_AMBULATORY_CARE_PROVIDER_SITE_OTHER): Payer: Self-pay

## 2020-05-12 NOTE — Progress Notes (Unsigned)
Subjective:   Robin Banks is a 65 y.o. female who presents for an Initial Medicare Annual Wellness Visit.  I connected with Robin Banks today by telephone and verified that I am speaking with the correct person using two identifiers. Location patient: home Location provider: work Persons participating in the virtual visit: patient, Marine scientist.    I discussed the limitations, risks, security and privacy concerns of performing an evaluation and management service by telephone and the availability of in person appointments. I also discussed with the patient that there may be a patient responsible charge related to this service. The patient expressed understanding and verbally consented to this telephonic visit.    Interactive audio and video telecommunications were attempted between this provider and patient, however failed, due to patient having technical difficulties OR patient did not have access to video capability.  We continued and completed visit with audio only.  Some vital signs may be absent or patient reported.   Time Spent with patient on telephone encounter: 20 minutes   Review of Systems     Cardiac Risk Factors include: advanced age (>35men, >76 women);sedentary lifestyle     Objective:    Today's Vitals   05/13/20 1544  Weight: 119 lb (54 kg)  Height: 5\' 1"  (1.549 m)   Body mass index is 22.48 kg/m.  Advanced Directives 05/13/2020  Does Patient Have a Medical Advance Directive? Yes  Type of Paramedic of [place];Living will  Copy of [place] in Chart? No - copy requested    Current Medications (verified) Outpatient Encounter Medications as of 05/13/2020  Medication Sig  . albuterol (VENTOLIN HFA) 108 (90 Base) MCG/ACT inhaler Inhale 1-2 puffs into the lungs every 6 (six) hours as needed for wheezing or shortness of breath.  . Azelastine HCl 0.15 % SOLN Place 1-2 sprays into the nose 2 (two) times daily as needed (runny  nose, post nasal drip).  . budesonide-formoterol (SYMBICORT) 80-4.5 MCG/ACT inhaler Inhale 2 puffs into the lungs in the morning and at bedtime. Rinse mouth after each use  . Calcium Carb-Cholecalciferol (CALCIUM PLUS VITAMIN D3 PO) Take by mouth daily.  . fluticasone (FLONASE) 50 MCG/ACT nasal spray Place 2 sprays into both nostrils daily.  . montelukast (SINGULAIR) 10 MG tablet Take 1 tablet (10 mg total) by mouth at bedtime.  . Multiple Vitamin (MULTIVITAMIN PO) Take by mouth daily.  . Red Yeast Rice 600 MG CAPS Take 2 capsules by mouth daily.  . traZODone (DESYREL) 150 MG tablet Take 1 tablet (150 mg total) by mouth at bedtime. (Patient not taking: Reported on 05/13/2020)   No facility-administered encounter medications on file as of 05/13/2020.    Allergies (verified) Demerol [meperidine hcl] and Nitrofurantoin   History: Past Medical History:  Diagnosis Date  . Allergy   . Angio-edema   . Asthma   . Cataract   . Chicken pox   . Compression fracture of thoracic spine, non-traumatic (HCC)    T7-8  . Depression   . GERD (gastroesophageal reflux disease)   . Hyperlipidemia    no meds taken  . Insomnia   . Menopause   . Osteopenia   . Overactive bladder   . TMJ (temporomandibular joint syndrome)   . Urinary incontinence    Past Surgical History:  Procedure Laterality Date  . BLADDER SUSPENSION  2011   Dr. Matilde Sprang  . CARPAL TUNNEL RELEASE Bilateral 2014  . COLONOSCOPY    . TUBAL LIGATION Bilateral 1978  Family History  Problem Relation Age of Onset  . Hypertension Father   . CAD Father   . Hearing loss Father   . Heart disease Father   . COPD Father   . Hyperlipidemia Mother   . Arthritis Mother   . Alcohol abuse Paternal Grandfather   . Heart attack Paternal Grandfather   . CAD Paternal Grandfather   . Dementia Paternal Grandmother   . Aortic aneurysm Maternal Grandmother   . Cerebral palsy Son        Mild CP by records  . Kidney nephrosis Son         FSG, kidney transplant.   . Colon cancer Neg Hx   . Esophageal cancer Neg Hx   . Stomach cancer Neg Hx   . Rectal cancer Neg Hx   . Allergic rhinitis Neg Hx   . Asthma Neg Hx   . Eczema Neg Hx   . Urticaria Neg Hx    Social History   Socioeconomic History  . Marital status: Married    Spouse name: Not on file  . Number of children: Not on file  . Years of education: Not on file  . Highest education level: Not on file  Occupational History  . Not on file  Tobacco Use  . Smoking status: Never Smoker  . Smokeless tobacco: Never Used  Vaping Use  . Vaping Use: Never used  Substance and Sexual Activity  . Alcohol use: Yes    Alcohol/week: 1.0 standard drink    Types: 1 Glasses of wine per week  . Drug use: No  . Sexual activity: Yes    Partners: Male    Comment: married  Other Topics Concern  . Not on file  Social History Narrative   Married to [facility]). Three children [name] and [name].    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    Takes herbal remedies, daily vitamin,    Wears seatbelt   Exercises 3x a week   Smoke detector in the home.    Firearms in the home (locked)   Feels safe in relationship.    Social Determinants of Health   Financial Resource Strain: Low Risk   . Difficulty of Paying Living Expenses: Not hard at all  Food Insecurity: No Food Insecurity  . Worried About Charity fundraiser in the Last Year: Never true  . Ran Out of Food in the Last Year: Never true  Transportation Needs: No Transportation Needs  . Lack of Transportation (Medical): No  . Lack of Transportation (Non-Medical): No  Physical Activity: Insufficiently Active  . Days of Exercise per Week: 3 days  . Minutes of Exercise per Session: 30 min  Stress: No Stress Concern Present  . Feeling of Stress : Not at all  Social Connections: Socially Integrated  . Frequency of Communication with Friends and Family: More than three times a week  . Frequency of Social Gatherings with Friends  and Family: More than three times a week  . Attends Religious Services: More than 4 times per year  . Active Member of Clubs or Organizations: Yes  . Attends Archivist Meetings: 1 to 4 times per year  . Marital Status: Married    Tobacco Counseling Counseling given: Not Answered   Clinical Intake:  Pre-visit preparation completed: Yes  Pain : No/denies pain     Nutritional Status: BMI of 19-24  Normal Nutritional Risks: None Diabetes: No  How often do you need to have someone help  you when you read instructions, pamphlets, or other written materials from your doctor or pharmacy?: 1 - Never  Diabetic?No  Interpreter Needed?: No  Information entered by :: Caroleen Hamman LPN   Activities of Daily Living In your present state of health, do you have any difficulty performing the following activities: 05/13/2020  Hearing? Y  Comment hearing loss  Vision? N  Difficulty concentrating or making decisions? N  Walking or climbing stairs? N  Dressing or bathing? N  Doing errands, shopping? N  Preparing Food and eating ? N  Using the Toilet? N  In the past six months, have you accidently leaked urine? Y  Do you have problems with loss of bowel control? N  Managing your Medications? N  Managing your Finances? N  Housekeeping or managing your Housekeeping? N  Some recent data might be hidden    Patient Care Team: Ma Hillock, DO as PCP - General (Family Medicine) Arvella Nigh, MD as Consulting Physician (Obstetrics and Gynecology) Tanda Rockers, MD as Consulting Physician (Pulmonary Disease)  Indicate any recent Medical Services you may have received from other than [facility] providers in the past year (date may be approximate).     Assessment:   This is a routine wellness examination for Robin Banks.  Hearing/Vision screen  Hearing Screening   125Hz  250Hz  500Hz  1000Hz  2000Hz  3000Hz  4000Hz  6000Hz  8000Hz   Right ear:           Left ear:           Comments:  Bilateral hearing aids  Vision Screening Comments: Wears glasses Last eye exam-09/2019-[facility]  Dietary issues and exercise activities discussed: Current Exercise Habits: The patient does not participate in regular exercise at present;Home exercise routine, Type of exercise: walking (exercise bike, water aerobics), Time (Minutes): 30, Frequency (Times/Week): 3, Weekly Exercise (Minutes/Week): 90, Intensity: Mild, Exercise limited by: None identified  Goals    . Patient Stated     Increase activity      Depression Screen PHQ 2/9 Scores 05/13/2020 02/03/2020 02/03/2020 12/05/2018 06/04/2018 12/04/2017 04/06/2017  PHQ - 2 Score 1 0 0 1 1 1  0  PHQ- 9 Score - 0 - 1 5 3  0    Fall Risk Fall Risk  05/13/2020 01/10/2020 12/05/2018 10/26/2015  Falls in the past year? 0 0 0 No  Number falls in past yr: 0 0 - -  Injury with Fall? 0 0 - -  Follow up Falls prevention discussed - Falls evaluation completed;Education provided;Falls prevention discussed -    FALL RISK PREVENTION PERTAINING TO THE HOME:  Any stairs in or around the home? Yes  If so, are there any without handrails? No  Home free of loose throw rugs in walkways, pet beds, electrical cords, etc? Yes  Adequate lighting in your home to reduce risk of falls? Yes   ASSISTIVE DEVICES UTILIZED TO PREVENT FALLS:  Life alert? No  Use of a cane, walker or w/c? No  Grab bars in the bathroom? No  Shower chair or bench in shower? No  Elevated toilet seat or a handicapped toilet? No   TIMED UP AND GO:  Was the test performed? No . Phone visit   Cognitive Function:Normal cognitive status assessed by this Nurse Health Advisor. No abnormalities found.          Immunizations Immunization History  Administered Date(s) Administered  . Fluad Quad(high Dose 65+) 12/05/2018, 01/10/2020  . Influenza,inj,Quad PF,6+ Mos 03/24/2016, 01/24/2017, 12/04/2017  . Influenza-Unspecified 12/26/2013, 01/24/2017  .  PFIZER(Purple Top)SARS-COV-2  Vaccination 06/02/2019, 07/02/2019, 01/28/2020  . Pneumococcal Conjugate-13 02/03/2020  . Pneumococcal Polysaccharide-23 11/20/2012  . Td 04/29/2020  . Tdap 02/03/2009  . Zoster 03/15/2013    TDAP status: Due, Education has been provided regarding the importance of this vaccine. Advised may receive this vaccine at local pharmacy or Health Dept. Aware to provide a copy of the vaccination record if obtained from local pharmacy or Health Dept. Verbalized acceptance and understanding.  Flu Vaccine status: Up to date  Pneumococcal vaccine status: Up to date  Covid-19 vaccine status: Completed vaccines  Qualifies for Shingles Vaccine? Yes   Zostavax completed Yes   Shingrix Completed?: No.    Education has been provided regarding the importance of this vaccine. Patient has been advised to call insurance company to determine out of pocket expense if they have not yet received this vaccine. Advised may also receive vaccine at local pharmacy or Health Dept. Verbalized acceptance and understanding.  Screening Tests Health Maintenance  Topic Date Due  . MAMMOGRAM  03/28/2020  . PNA vac Low Risk Adult (2 of 2 - PPSV23) 02/02/2021  . DEXA SCAN  03/28/2024  . COLONOSCOPY (Pts 45-8yrs Insurance coverage will need to be confirmed)  05/22/2029  . TETANUS/TDAP  04/29/2030  . INFLUENZA VACCINE  Completed  . COVID-19 Vaccine  Completed  . Hepatitis C Screening  Completed    Health Maintenance  Health Maintenance Due  Topic Date Due  . MAMMOGRAM  03/28/2020    Colorectal cancer screening: Type of screening: Colonoscopy. Completed 05/23/2019. Repeat every 10 years  Mammogram status: Ordered today. Pt provided with contact info and advised to call to schedule appt.   Bone Density status: Completed 03/29/2019. Results reflect: Bone density results: OSTEOPENIA. Repeat every 2 years.  Lung Cancer Screening: (Low Dose CT Chest recommended if Age 33-80 years, 30 pack-year currently smoking OR have  quit w/in 15years.) does not qualify.     Additional Screening:  Hepatitis C Screening: Completed 02/03/2020  Vision Screening: Recommended annual ophthalmology exams for early detection of glaucoma and other disorders of the eye. Is the patient up to date with their annual eye exam?  Yes  Who is the provider or what is the name of the office in which the patient attends annual eye exams? [place]   Dental Screening: Recommend annual dental exams for proper oral hygiene  Community Resource Referral / Chronic Care Management: CRR required this visit?  No   CCM required this visit?  No      Plan:     I have personally reviewed and noted the following in the patient's chart:   . Medical and social history . Use of alcohol, tobacco or illicit drugs  . Current medications and supplements . Functional ability and status . Nutritional status . Physical activity . Advanced directives . List of other physicians . Hospitalizations, surgeries, and ER visits in previous 12 months . Vitals . Screenings to include cognitive, depression, and falls . Referrals and appointments  In addition, I have reviewed and discussed with patient certain preventive protocols, quality metrics, and best practice recommendations. A written personalized care plan for preventive services as well as general preventive health recommendations were provided to patient.   Due to this being a telephonic visit, the after visit summary with patients personalized plan was offered to patient via mail or my-chart. Patient would like to access on my-chart.   Marta Antu, LPN   09/14/5091  Nurse Health Advisor  Nurse  Notes: None

## 2020-05-13 ENCOUNTER — Ambulatory Visit (INDEPENDENT_AMBULATORY_CARE_PROVIDER_SITE_OTHER): Payer: Medicare HMO

## 2020-05-13 VITALS — Ht 61.0 in | Wt 119.0 lb

## 2020-05-13 DIAGNOSIS — Z0000 Encounter for general adult medical examination without abnormal findings: Secondary | ICD-10-CM

## 2020-05-13 DIAGNOSIS — Z1231 Encounter for screening mammogram for malignant neoplasm of breast: Secondary | ICD-10-CM | POA: Diagnosis not present

## 2020-05-13 NOTE — Progress Notes (Deleted)
Follow Up Note  RE: Robin Banks MRN: [id number] DOB: 12-29-1955 Date of Office Visit: 05/14/2020  Referring provider: Ma Hillock, DO Primary care provider: Ma Hillock, DO  Chief Complaint: No chief complaint on file.  History of Present Illness: I had the pleasure of seeing Robin Banks for a follow up visit at the [facility] [place] on 05/13/2020. She is a 65 y.o. female, who is being followed for asthma and allergic rhinitis. Her previous allergy office visit was on 12/26/2019 with Dr. Maudie Mercury. Today is a regular follow up visit.  Moderate persistent asthma without complication Past history - Diagnosed with asthma over 20 years ago and was using Qvar 40 1-2 puffs QAM and albuterol with good benefit. However she gets flares and prednisone usually helps. Current flare started in December with no improvement after prednisone and azithromycin. Daily coughing with some brownish phlegm production. Recently started on zyrtec, and Singulair with unknown benefit. History of GERD but not on any medications. No COVID-19 diagnosis. Denies cardiac symptoms. 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 Chest x-ray showed no acute process. Breo not covered. Interim history - coughing the last 2 weeks feels like it's coming from her throat.   Today's spirometry showed some restriction with no improvement in FEV1 post bronchodilator treatment. Clinically feeling the same.   Coughing most likely from PND.  ACT score 16.  Take some Mucinex to loosen up the phlegm twice a day. Take with plenty of water.   Daily controller medication(s):continue with Symbicort 49mcg 2 puffs twice a day with spacer and rinse mouth afterwards.  Spacer given and demonstrated proper use with inhaler. Patient understood technique and all questions/concerned were addressed.   May use albuterol rescue inhaler 2 puffs or  nebulizer every 4 to 6 hours as needed for shortness of breath, chest tightness, coughing, and wheezing. May use albuterol rescue inhaler 2 puffs 5 to 15 minutes prior to strenuous physical activities. Monitor frequency of use.  Seasonal and perennial allergic rhinitis Past history - 2011 skin testing was positive to tree pollen, dust mites and one mold. Dymista too expensive Interim history - PND with coughing.   Continue environmental control measures.   Continue Singulair (montelukast) 10mg  daily.  May use Flonase (fluticasone) nasal spray 2 sprays per nostril once a day as needed for nasal congestion.   May use azelastine nasal spray 1-2 sprays per nostril twice a day as needed for runny nose/drainage.  May use over the counter antihistamines such as Zyrtec (cetirizine) daily as needed.  Bilateral impacted cerumen  May use debrox 5-10 drops in each ear twice a day for 4 days to loosen up the earwax.  Return in about 4 months (around 04/26/2020).   Assessment and Plan: Robin Banks is a 65 y.o. female with: No problem-specific Assessment & Plan notes found for this encounter.  No follow-ups on file.  No orders of the defined types were placed in this encounter.  Lab Orders  No laboratory test(s) ordered today    Diagnostics: Spirometry:  Tracings reviewed. Her effort: {Blank single:19197::"Good reproducible efforts.","It was hard to get consistent efforts and there is a question as to whether this reflects a maximal maneuver.","Poor effort, data can not be interpreted."} FVC: ***L FEV1: ***L, ***% predicted FEV1/FVC ratio: ***% Interpretation: {Blank single:19197::"Spirometry consistent with mild obstructive disease","Spirometry consistent with moderate obstructive disease","Spirometry consistent with severe obstructive disease","Spirometry consistent with possible restrictive disease","Spirometry consistent  with mixed obstructive and restrictive disease","Spirometry  uninterpretable due to technique","Spirometry consistent with normal pattern","No overt abnormalities noted given today's efforts"}.  Please see scanned spirometry results for details.  Skin Testing: {Blank single:19197::"Select foods","Environmental allergy panel","Environmental allergy panel and select foods","Food allergy panel","None","Deferred due to recent antihistamines use"}. Positive test to: ***. Negative test to: ***.  Results discussed with patient/family.   Medication List:  Current Outpatient Medications  Medication Sig Dispense Refill  . albuterol (VENTOLIN HFA) 108 (90 Base) MCG/ACT inhaler Inhale 1-2 puffs into the lungs every 6 (six) hours as needed for wheezing or shortness of breath. 8 g 11  . Azelastine HCl 0.15 % SOLN Place 1-2 sprays into the nose 2 (two) times daily as needed (runny nose, post nasal drip). 30 mL 5  . budesonide-formoterol (SYMBICORT) 80-4.5 MCG/ACT inhaler Inhale 2 puffs into the lungs in the morning and at bedtime. Rinse mouth after each use 1 Inhaler 5  . Calcium Carb-Cholecalciferol (CALCIUM PLUS VITAMIN D3 PO) Take by mouth daily.    . fluticasone (FLONASE) 50 MCG/ACT nasal spray Place 2 sprays into both nostrils daily. 16 g 5  . montelukast (SINGULAIR) 10 MG tablet Take 1 tablet (10 mg total) by mouth at bedtime. 90 tablet 1  . Multiple Vitamin (MULTIVITAMIN PO) Take by mouth daily.    . Red Yeast Rice 600 MG CAPS Take 2 capsules by mouth daily. (Patient not taking: Reported on 02/03/2020)    . traZODone (DESYREL) 150 MG tablet Take 1 tablet (150 mg total) by mouth at bedtime. 90 tablet 1   No current facility-administered medications for this visit.   Allergies: Allergies  Allergen Reactions  . Demerol [Meperidine Hcl] Nausea And Vomiting  . Nitrofurantoin Nausea Only    swelling   I reviewed her past medical history, social history, family history, and environmental history and no significant changes have been reported from her previous  visit.  Review of Systems  Constitutional: Negative for appetite change, chills, fever and unexpected weight change.  HENT: Positive for congestion, postnasal drip and voice change. Negative for rhinorrhea.   Eyes: Negative for itching.  Respiratory: Positive for cough. Negative for chest tightness, shortness of breath and wheezing.   Cardiovascular: Negative for chest pain.  Gastrointestinal: Negative for abdominal pain.  Genitourinary: Negative for difficulty urinating.  Skin: Negative for rash.  Allergic/Immunologic: Positive for environmental allergies. Negative for food allergies.   Objective: There were no vitals taken for this visit. There is no height or weight on file to calculate BMI. Physical Exam Vitals and nursing note reviewed.  Constitutional:      Appearance: Normal appearance. She is well-developed and normal weight.  HENT:     Head: Normocephalic and atraumatic.     Right Ear: External ear normal. There is impacted cerumen.     Left Ear: External ear normal. There is impacted cerumen.     Nose: Nose normal. No congestion or rhinorrhea.     Mouth/Throat:     Mouth: Mucous membranes are moist.     Pharynx: Oropharynx is clear.  Eyes:     Conjunctiva/sclera: Conjunctivae normal.  Cardiovascular:     Rate and Rhythm: Normal rate and regular rhythm.     Heart sounds: Normal heart sounds. No murmur heard. No friction rub. No gallop.   Pulmonary:     Effort: Pulmonary effort is normal.     Breath sounds: Normal breath sounds. No wheezing or rales.  Musculoskeletal:     Cervical back: Neck supple.  Skin:    General: Skin is warm.     Findings: No rash.  Neurological:     Mental Status: She is alert and oriented to person, place, and time.  Psychiatric:        Mood and Affect: Mood normal.        Behavior: Behavior normal.    Previous notes and tests were reviewed. The plan was reviewed with the patient/family, and all questions/concerned were  addressed.  It was my pleasure to see Joni today and participate in her care. Please feel free to contact me with any questions or concerns.  Sincerely,  Rexene Alberts, DO Allergy & Immunology  [facility] [place] office: [place] office: [phone]

## 2020-05-13 NOTE — Patient Instructions (Addendum)
Robin Banks , Thank you for taking time to complete your Medicare Wellness Visit. I appreciate your ongoing commitment to your health goals. Please review the following plan we discussed and let me know if I can assist you in the future.   Screening recommendations/referrals: Colonoscopy: Completed 05/23/2019-Due-05/22/2029 Mammogram: Ordered today. Someone will be calling you to schedule. Bone Density: Completed 03/29/2019-Due 03/28/2021 Recommended yearly ophthalmology/optometry visit for glaucoma screening and checkup Recommended yearly dental visit for hygiene and checkup  Vaccinations: Influenza vaccine: Up to date Pneumococcal vaccine: Completed vaccines Tdap vaccine: Up to date-Due 2032 Shingles vaccine: Per our conversation today, completed first dose of Shingrix. Covid-19:Completed vaccines  Advanced directives: Please bring a copy for your chart  Conditions/risks identified: See problem list  Next appointment: Follow up in one year for your annual wellness visit    Preventive Care 65 Years and Older, Female Preventive care refers to lifestyle choices and visits with your health care provider that can promote health and wellness. What does preventive care include?  A yearly physical exam. This is also called an annual well check.  Dental exams once or twice a year.  Routine eye exams. Ask your health care provider how often you should have your eyes checked.  Personal lifestyle choices, including:  Daily care of your teeth and gums.  Regular physical activity.  Eating a healthy diet.  Avoiding tobacco and drug use.  Limiting alcohol use.  Practicing safe sex.  Taking low-dose aspirin every day.  Taking vitamin and mineral supplements as recommended by your health care provider. What happens during an annual well check? The services and screenings done by your health care provider during your annual well check will depend on your age, overall health, lifestyle  risk factors, and family history of disease. Counseling  Your health care provider may ask you questions about your:  Alcohol use.  Tobacco use.  Drug use.  Emotional well-being.  Home and relationship well-being.  Sexual activity.  Eating habits.  History of falls.  Memory and ability to understand (cognition).  Work and work Statistician.  Reproductive health. Screening  You may have the following tests or measurements:  Height, weight, and BMI.  Blood pressure.  Lipid and cholesterol levels. These may be checked every 5 years, or more frequently if you are over 76 years old.  Skin check.  Lung cancer screening. You may have this screening every year starting at age 67 if you have a 30-pack-year history of smoking and currently smoke or have quit within the past 15 years.  Fecal occult blood test (FOBT) of the stool. You may have this test every year starting at age 14.  Flexible sigmoidoscopy or colonoscopy. You may have a sigmoidoscopy every 5 years or a colonoscopy every 10 years starting at age 66.  Hepatitis C blood test.  Hepatitis B blood test.  Sexually transmitted disease (STD) testing.  Diabetes screening. This is done by checking your blood sugar (glucose) after you have not eaten for a while (fasting). You may have this done every 1-3 years.  Bone density scan. This is done to screen for osteoporosis. You may have this done starting at age 17.  Mammogram. This may be done every 1-2 years. Talk to your health care provider about how often you should have regular mammograms. Talk with your health care provider about your test results, treatment options, and if necessary, the need for more tests. Vaccines  Your health care provider may recommend certain vaccines, such as:  Influenza vaccine. This is recommended every year.  Tetanus, diphtheria, and acellular pertussis (Tdap, Td) vaccine. You may need a Td booster every 10 years.  Zoster vaccine.  You may need this after age 33.  Pneumococcal 13-valent conjugate (PCV13) vaccine. One dose is recommended after age 15.  Pneumococcal polysaccharide (PPSV23) vaccine. One dose is recommended after age 29. Talk to your health care provider about which screenings and vaccines you need and how often you need them. This information is not intended to replace advice given to you by your health care provider. Make sure you discuss any questions you have with your health care provider. Document Released: 04/10/2015 Document Revised: 12/02/2015 Document Reviewed: 01/13/2015 Elsevier Interactive Patient Education  2017 [employer] Prevention in the Home Falls can cause injuries. They can happen to people of all ages. There are many things you can do to make your home safe and to help prevent falls. What can I do on the outside of my home?  Regularly fix the edges of walkways and driveways and fix any cracks.  Remove anything that might make you trip as you walk through a door, such as a raised step or threshold.  Trim any bushes or trees on the path to your home.  Use bright outdoor lighting.  Clear any walking paths of anything that might make someone trip, such as rocks or tools.  Regularly check to see if handrails are loose or broken. Make sure that both sides of any steps have handrails.  Any raised decks and porches should have guardrails on the edges.  Have any leaves, snow, or ice cleared regularly.  Use sand or salt on walking paths during winter.  Clean up any spills in your garage right away. This includes oil or grease spills. What can I do in the bathroom?  Use night lights.  Install grab bars by the toilet and in the tub and shower. Do not use towel bars as grab bars.  Use non-skid mats or decals in the tub or shower.  If you need to sit down in the shower, use a plastic, non-slip stool.  Keep the floor dry. Clean up any water that spills on the floor as soon  as it happens.  Remove soap buildup in the tub or shower regularly.  Attach bath mats securely with double-sided non-slip rug tape.  Do not have throw rugs and other things on the floor that can make you trip. What can I do in the bedroom?  Use night lights.  Make sure that you have a light by your bed that is easy to reach.  Do not use any sheets or blankets that are too big for your bed. They should not hang down onto the floor.  Have a firm chair that has side arms. You can use this for support while you get dressed.  Do not have throw rugs and other things on the floor that can make you trip. What can I do in the kitchen?  Clean up any spills right away.  Avoid walking on wet floors.  Keep items that you use a lot in easy-to-reach places.  If you need to reach something above you, use a strong step stool that has a grab bar.  Keep electrical cords out of the way.  Do not use floor polish or wax that makes floors slippery. If you must use wax, use non-skid floor wax.  Do not have throw rugs and other things on the floor that  can make you trip. What can I do with my stairs?  Do not leave any items on the stairs.  Make sure that there are handrails on both sides of the stairs and use them. Fix handrails that are broken or loose. Make sure that handrails are as long as the stairways.  Check any carpeting to make sure that it is firmly attached to the stairs. Fix any carpet that is loose or worn.  Avoid having throw rugs at the top or bottom of the stairs. If you do have throw rugs, attach them to the floor with carpet tape.  Make sure that you have a light switch at the top of the stairs and the bottom of the stairs. If you do not have them, ask someone to add them for you. What else can I do to help prevent falls?  Wear shoes that:  Do not have high heels.  Have rubber bottoms.  Are comfortable and fit you well.  Are closed at the toe. Do not wear sandals.  If  you use a stepladder:  Make sure that it is fully opened. Do not climb a closed stepladder.  Make sure that both sides of the stepladder are locked into place.  Ask someone to hold it for you, if possible.  Clearly mark and make sure that you can see:  Any grab bars or handrails.  First and last steps.  Where the edge of each step is.  Use tools that help you move around (mobility aids) if they are needed. These include:  Canes.  Walkers.  Scooters.  Crutches.  Turn on the lights when you go into a dark area. Replace any light bulbs as soon as they burn out.  Set up your furniture so you have a clear path. Avoid moving your furniture around.  If any of your floors are uneven, fix them.  If there are any pets around you, be aware of where they are.  Review your medicines with your doctor. Some medicines can make you feel dizzy. This can increase your chance of falling. Ask your doctor what other things that you can do to help prevent falls. This information is not intended to replace advice given to you by your health care provider. Make sure you discuss any questions you have with your health care provider. Document Released: 01/08/2009 Document Revised: 08/20/2015 Document Reviewed: 04/18/2014 Elsevier Interactive Patient Education  2017 [employer].

## 2020-05-14 ENCOUNTER — Ambulatory Visit: Payer: Medicare HMO | Admitting: Allergy

## 2020-06-26 ENCOUNTER — Telehealth (INDEPENDENT_AMBULATORY_CARE_PROVIDER_SITE_OTHER): Payer: Medicare HMO | Admitting: Family Medicine

## 2020-06-26 ENCOUNTER — Encounter: Payer: Self-pay | Admitting: Family Medicine

## 2020-06-26 VITALS — Temp 97.5°F

## 2020-06-26 DIAGNOSIS — J329 Chronic sinusitis, unspecified: Secondary | ICD-10-CM

## 2020-06-26 DIAGNOSIS — B9689 Other specified bacterial agents as the cause of diseases classified elsewhere: Secondary | ICD-10-CM | POA: Diagnosis not present

## 2020-06-26 MED ORDER — CEFDINIR 300 MG PO CAPS: 300.0000 mg | ORAL_CAPSULE | Freq: Two times a day (BID) | ORAL | 0 refills | Status: DC

## 2020-06-26 NOTE — Progress Notes (Signed)
VIRTUAL VISIT VIA VIDEO  I connected with Robin Banks on 06/26/20 at  4:00 PM EDT by elemedicine application and verified that I am speaking with the correct person using two identifiers. Location patient: Home Location provider: [facility], Office Persons participating in the virtual visit: Patient, Dr. Raoul Pitch and Darnell Level. Cesar, CMA  I discussed the limitations of evaluation and management by telemedicine and the availability of in person appointments. The patient expressed understanding and agreed to proceed.   SUBJECTIVE Chief Complaint  Patient presents with  . Cough    Pt c/o productive cough with greenish tint, runny nose, nasal congestion x 5 days    HPI: Robin Banks is a 65 y.o. female present for 5-6-day of upper respiratory symptoms.  She states 2 days ago her symptoms are completely resolved and she thought she was over the illness and then her symptoms came back quite severely with a new productive cough.  Initially she had runny nose, nasal congestion and she was taking DayQuil, NyQuil and Zicam for her relief.  She is compliant with her allergy regimen which includes Singulair, Flonase and Astelin.  She denies fever, chills, nausea, vomit or diarrhea.  She endorses right-sided sinus maxillary pressure that is new today.  ROS: See pertinent positives and negatives per HPI.  Patient Active Problem List   Diagnosis Date Noted  . Seasonal and perennial allergic rhinitis 06/27/2019  . Hallux rigidus, acquired, left 06/04/2018  . Elevated hemoglobin A1c 04/07/2017  . Moderate recurrent major depression ([place]) 01/30/2017  . Postmenopausal symptoms 10/28/2015  . Osteopenia 10/28/2015  . Overactive bladder 10/28/2015  . Insomnia 10/12/2015  . Asthma 01/09/2007    Social History   Tobacco Use  . Smoking status: Never Smoker  . Smokeless tobacco: Never Used  Substance Use Topics  . Alcohol use: Yes    Alcohol/week: 1.0 standard drink    Types: 1 Glasses of  wine per week    Current Outpatient Medications:  .  albuterol (VENTOLIN HFA) 108 (90 Base) MCG/ACT inhaler, Inhale 1-2 puffs into the lungs every 6 (six) hours as needed for wheezing or shortness of breath., Disp: 8 g, Rfl: 11 .  Azelastine HCl 0.15 % SOLN, Place 1-2 sprays into the nose 2 (two) times daily as needed (runny nose, post nasal drip)., Disp: 30 mL, Rfl: 5 .  budesonide-formoterol (SYMBICORT) 80-4.5 MCG/ACT inhaler, Inhale 2 puffs into the lungs in the morning and at bedtime. Rinse mouth after each use, Disp: 1 Inhaler, Rfl: 5 .  Calcium Carb-Cholecalciferol (CALCIUM PLUS VITAMIN D3 PO), Take by mouth daily., Disp: , Rfl:  .  cefdinir (OMNICEF) 300 MG capsule, Take 1 capsule (300 mg total) by mouth 2 (two) times daily., Disp: 20 capsule, Rfl: 0 .  fluticasone (FLONASE) 50 MCG/ACT nasal spray, Place 2 sprays into both nostrils daily., Disp: 16 g, Rfl: 5 .  montelukast (SINGULAIR) 10 MG tablet, Take 1 tablet (10 mg total) by mouth at bedtime., Disp: 90 tablet, Rfl: 1 .  Multiple Vitamin (MULTIVITAMIN PO), Take by mouth daily., Disp: , Rfl:  .  Red Yeast Rice 600 MG CAPS, Take 2 capsules by mouth daily., Disp: , Rfl:  .  traZODone (DESYREL) 150 MG tablet, Take 1 tablet (150 mg total) by mouth at bedtime., Disp: 90 tablet, Rfl: 1  Allergies  Allergen Reactions  . Demerol [Meperidine Hcl] Nausea And Vomiting  . Nitrofurantoin Nausea Only    swelling    OBJECTIVE: Temp (!) 97.5 F (36.4  C) (Oral)  Gen: No acute distress. Nontoxic in appearance.  Pleasant female. HENT: AT. [place].  MMM.  Nasal congestion Eyes:Pupils Equal Round Reactive to light, Extraocular movements intact,  Conjunctiva without redness, discharge or icterus. Chest: Cough or shortness of breath not present on exam Skin: No rashes, purpura or petechiae.  Neuro:Alert. Oriented x3  Psych: Normal affect and demeanor. Normal speech. Normal thought content and judgment.  ASSESSMENT AND PLAN: Robin Banks is a 65  y.o. female present for  Bacterial sinusitis Rest, hydrate.  Continue Flonase, Singulair and Astelin Doxy twice daily prescribed, take until completed.  F/U 2 weeks if not improved.      Howard Pouch, DO 06/26/2020   No follow-ups on file.  No orders of the defined types were placed in this encounter.  Meds ordered this encounter  Medications  . cefdinir (OMNICEF) 300 MG capsule    Sig: Take 1 capsule (300 mg total) by mouth 2 (two) times daily.    Dispense:  20 capsule    Refill:  0   Referral Orders  No referral(s) requested today

## 2020-07-31 ENCOUNTER — Ambulatory Visit (INDEPENDENT_AMBULATORY_CARE_PROVIDER_SITE_OTHER): Payer: Medicare HMO | Admitting: Family Medicine

## 2020-07-31 ENCOUNTER — Encounter: Payer: Self-pay | Admitting: Family Medicine

## 2020-07-31 ENCOUNTER — Other Ambulatory Visit: Payer: Self-pay

## 2020-07-31 VITALS — BP 85/59 | HR 75 | Temp 97.6°F | Ht 61.0 in | Wt 121.0 lb

## 2020-07-31 DIAGNOSIS — R7309 Other abnormal glucose: Secondary | ICD-10-CM

## 2020-07-31 DIAGNOSIS — J302 Other seasonal allergic rhinitis: Secondary | ICD-10-CM | POA: Diagnosis not present

## 2020-07-31 DIAGNOSIS — J3089 Other allergic rhinitis: Secondary | ICD-10-CM

## 2020-07-31 DIAGNOSIS — J4540 Moderate persistent asthma, uncomplicated: Secondary | ICD-10-CM

## 2020-07-31 DIAGNOSIS — G4700 Insomnia, unspecified: Secondary | ICD-10-CM | POA: Diagnosis not present

## 2020-07-31 DIAGNOSIS — F331 Major depressive disorder, recurrent, moderate: Secondary | ICD-10-CM | POA: Diagnosis not present

## 2020-07-31 DIAGNOSIS — R69 Illness, unspecified: Secondary | ICD-10-CM | POA: Diagnosis not present

## 2020-07-31 DIAGNOSIS — F5101 Primary insomnia: Secondary | ICD-10-CM

## 2020-07-31 LAB — POCT GLYCOSYLATED HEMOGLOBIN (HGB A1C)
HbA1c POC (<> result, manual entry): 5.4 % (ref 4.0–5.6)
HbA1c, POC (controlled diabetic range): 5.4 % (ref 0.0–7.0)
HbA1c, POC (prediabetic range): 5.4 % — AB (ref 5.7–6.4)
Hemoglobin A1C: 5.4 % (ref 4.0–5.6)

## 2020-07-31 MED ORDER — TRAZODONE HCL 150 MG PO TABS: 150.0000 mg | ORAL_TABLET | Freq: Every day | ORAL | 1 refills | Status: DC

## 2020-07-31 MED ORDER — MONTELUKAST SODIUM 10 MG PO TABS: 10.0000 mg | ORAL_TABLET | Freq: Every day | ORAL | 1 refills | Status: DC

## 2020-07-31 NOTE — Patient Instructions (Addendum)
  Great to see you today.  I have refilled your medications.    Next appt in 6 mos for your physical.

## 2020-07-31 NOTE — Progress Notes (Signed)
This visit occurred during the SARS-CoV-2 public health emergency.  Safety protocols were in place, including screening questions prior to the visit, additional usage of staff PPE, and extensive cleaning of exam room while observing appropriate contact time as indicated for disinfecting solutions.    Patient ID: Robin Banks, female  DOB: 10-Oct-1955, 65 y.o.   MRN: [id number] Patient Care Team    Relationship Specialty Notifications Start End  Ma Hillock, DO PCP - General Family Medicine  10/26/15   Arvella Nigh, MD Consulting Physician Obstetrics and Gynecology  10/28/15   Tanda Rockers, MD Consulting Physician Pulmonary Disease  10/28/15     Chief Complaint  Patient presents with  . Follow-up    [facility]; pt is not fasting  . Depression    Subjective: Robin Banks is a 65 y.o.  Female  present for [facility] Insomnia:She reports use of trazodone 150 mg. No side effects. She is sleeping well on this medication.   Asthma/cough:   She is now been seen with by allergy and asthma. They prescribe her symbicort and dymista (tried qvar and breo[too expensive]). She reports compliance  with singulair. She uses zyrtec when needed.   Elevated a1c 6.3 last office visit. Wt about the same.    Depression screen [facility] 2/9 06/26/2020 05/13/2020 02/03/2020 02/03/2020 12/05/2018  Decreased Interest 0 0 0 0 0  Down, Depressed, Hopeless 0 1 0 0 1  PHQ - 2 Score 0 1 0 0 1  Altered sleeping 0 - 0 - 0  Tired, decreased energy 0 - 0 - 0  Change in appetite 0 - 0 - 0  Feeling bad or failure about yourself  0 - 0 - 0  Trouble concentrating 0 - 0 - 0  Moving slowly or fidgety/restless 0 - 0 - 0  Suicidal thoughts 0 - 0 - 0  PHQ-9 Score 0 - 0 - 1  Difficult doing work/chores - - - - Not difficult at all  Some recent data might be hidden   GAD 7 : Generalized Anxiety Score 12/05/2018 06/04/2018 04/06/2017 03/24/2016  Nervous, Anxious, on Edge 0 0 0 3  Control/stop worrying 0 0 0 1  Worry too much - different  things 0 0 0 1  Trouble relaxing 1 0 0 2  Restless 0 0 0 0  Easily annoyed or irritable 0 0 0 1  Afraid - awful might happen 0 0 0 0  Total GAD 7 Score 1 0 0 8  Anxiety Difficulty Not difficult at all Somewhat difficult Not difficult at all Somewhat difficult    Immunization History  Administered Date(s) Administered  . Fluad Quad(high Dose 65+) 12/05/2018, 01/10/2020  . Influenza,inj,Quad PF,6+ Mos 03/24/2016, 01/24/2017, 12/04/2017  . Influenza-Unspecified 12/26/2013, 01/24/2017  . PFIZER(Purple Top)SARS-COV-2 Vaccination 06/02/2019, 07/02/2019, 01/28/2020  . Pneumococcal Conjugate-13 02/03/2020  . Pneumococcal Polysaccharide-23 11/20/2012  . Td 04/29/2020  . Tdap 02/03/2009, 04/30/2020  . Zoster 03/15/2013  . Zoster Recombinat (Shingrix) 03/09/2020    Past Medical History:  Diagnosis Date  . Allergy   . Angio-edema   . Asthma   . Cataract   . Chicken pox   . Compression fracture of thoracic spine, non-traumatic (HCC)    T7-8  . Depression   . GERD (gastroesophageal reflux disease)   . Hyperlipidemia    no meds taken  . Insomnia   . Menopause   . Osteopenia   . Overactive bladder   . TMJ (temporomandibular joint syndrome)   . Urinary  incontinence    Allergies  Allergen Reactions  . Demerol [Meperidine Hcl] Nausea And Vomiting  . Nitrofurantoin Nausea Only    swelling   Past Surgical History:  Procedure Laterality Date  . BLADDER SUSPENSION  2011   Dr. Matilde Sprang  . CARPAL TUNNEL RELEASE Bilateral 2014  . COLONOSCOPY    . TUBAL LIGATION Bilateral 1978   Family History  Problem Relation Age of Onset  . Hypertension Father   . CAD Father   . Hearing loss Father   . Heart disease Father   . COPD Father   . Hyperlipidemia Mother   . Arthritis Mother   . Alcohol abuse Paternal Grandfather   . Heart attack Paternal Grandfather   . CAD Paternal Grandfather   . Dementia Paternal Grandmother   . Aortic aneurysm Maternal Grandmother   . Cerebral palsy Son         Mild CP by records  . Kidney nephrosis Son        FSG, kidney transplant.   . Colon cancer Neg Hx   . Esophageal cancer Neg Hx   . Stomach cancer Neg Hx   . Rectal cancer Neg Hx   . Allergic rhinitis Neg Hx   . Asthma Neg Hx   . Eczema Neg Hx   . Urticaria Neg Hx    Social History   Social History Narrative   Married to [place]). Three children [name] and [name].    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    Takes herbal remedies, daily vitamin,    Wears seatbelt   Exercises 3x a week   Smoke detector in the home.    Firearms in the home (locked)   Feels safe in relationship.     Allergies as of 07/31/2020      Reactions   Demerol [meperidine Hcl] Nausea And Vomiting   Nitrofurantoin Nausea Only   swelling      Medication List       Accurate as of Jul 31, 2020  3:51 PM. If you have any questions, ask your nurse or doctor.        STOP taking these medications   cefdinir 300 MG capsule Commonly known as: OMNICEF Stopped by: Howard Pouch, DO     TAKE these medications   albuterol 108 (90 Base) MCG/ACT inhaler Commonly known as: VENTOLIN HFA Inhale 1-2 puffs into the lungs every 6 (six) hours as needed for wheezing or shortness of breath.   Azelastine HCl 0.15 % Soln Place 1-2 sprays into the nose 2 (two) times daily as needed (runny nose, post nasal drip).   BIOTIN PO Take by mouth.   budesonide-formoterol 80-4.5 MCG/ACT inhaler Commonly known as: Symbicort Inhale 2 puffs into the lungs in the morning and at bedtime. Rinse mouth after each use   CALCIUM PLUS VITAMIN D3 PO Take by mouth daily.   fluticasone 50 MCG/ACT nasal spray Commonly known as: Flonase Place 2 sprays into both nostrils daily.   montelukast 10 MG tablet Commonly known as: SINGULAIR Take 1 tablet (10 mg total) by mouth at bedtime.   MULTIVITAMIN PO Take by mouth daily.   Red Yeast Rice 600 MG Caps Take 2 capsules by mouth daily.   traZODone 150 MG tablet Commonly known  as: DESYREL Take 1 tablet (150 mg total) by mouth at bedtime.   VITAMIN B-12 PO Take 2,500 mcg by mouth.       All past medical history, surgical history, allergies, family history, immunizations andmedications were  updated in the EMR today and reviewed under the history and medication portions of their EMR.     Recent Results (from the past 2160 hour(s))  POCT HgB A1C     Status: Abnormal   Collection Time: 07/31/20  3:46 PM  Result Value Ref Range   Hemoglobin A1C 5.4 4.0 - 5.6 %   HbA1c POC (<> result, manual entry) 5.4 4.0 - 5.6 %   HbA1c, POC (prediabetic range) 5.4 (A) 5.7 - 6.4 %   HbA1c, POC (controlled diabetic range) 5.4 0.0 - 7.0 %     ROS: 14 pt review of systems performed and negative (unless mentioned in an HPI)  Objective: BP (!) 85/59   Pulse 75   Temp 97.6 F (36.4 C) (Oral)   Ht 5\' 1"  (1.549 m)   Wt 121 lb (54.9 kg)   SpO2 99%   BMI 22.86 kg/m  Gen: Afebrile. No acute distress.  HENT: AT. [place].  CV: RRR Chest: CTAB, no wheeze or crackles Neuro: Normal gait. PERLA. EOMi. Alert. Oriented x3 Psych: Normal affect, dress and demeanor. Normal speech. Normal thought content and judgment.    No exam data present  Assessment/plan: Robin Banks is a 65 y.o. female present for [facility] Insomnia, unspecified type/Moderate recurrent major depression ([place]) - stable.  - continue trazodone 150 mg Qhs. Waking sometimes - Follow-up every 5.5 months on current issues. Next visit is her cpe early Nov  Moderate persistent asthma without complication/allergic rhinitis/seasomal allergies - stable.  - continue inhalers prescribed by A&A - continue  singulair - continue OTC oral antihistamine PRN   Osteopenia, unspecified location followed by GYN DEXA UTD  Elevated a1c 6.3 in November glucose 92> 5.4 today!!! She did great. Cut back on sugar.  a1c collected today   Return in about 6 months (around 02/03/2021) for CPE (30 min), [facility] (30 min).   Orders Placed  This Encounter  Procedures  . POCT HgB A1C   Meds ordered this encounter  Medications  . traZODone (DESYREL) 150 MG tablet    Sig: Take 1 tablet (150 mg total) by mouth at bedtime.    Dispense:  90 tablet    Refill:  1  . montelukast (SINGULAIR) 10 MG tablet    Sig: Take 1 tablet (10 mg total) by mouth at bedtime.    Dispense:  90 tablet    Refill:  1   Referral Orders  No referral(s) requested today     Electronically signed by: Howard Pouch, [place]

## 2020-08-19 ENCOUNTER — Telehealth: Payer: Self-pay

## 2020-08-19 MED ORDER — MIRTAZAPINE 15 MG PO TBDP
15.0000 mg | ORAL_TABLET | Freq: Every day | ORAL | 0 refills | Status: DC
Start: 2020-08-19 — End: 2020-09-23

## 2020-08-19 NOTE — Telephone Encounter (Signed)
Please call patient: Discontinue trazodone and replaced with mirtazapine nightly about 1 hour before bed. -Take 1 tab p.o. about 1 hour before bed.  After 1 week if needed only may increase to 2 tabs before bed of mirtazapine. Follow-up in 4 weeks with provider appointment to discuss changes and see how she is doing on this medication.  Once dose is established we will refill back dose that works best for her.

## 2020-08-19 NOTE — Telephone Encounter (Signed)
Left patient detailed message per DPR  

## 2020-08-19 NOTE — Telephone Encounter (Signed)
Pt stats that in her last visit, it was discussed that she was having trouble sleeping or staying asleep. Pt called today to notify PCP that she is still  having trouble staying asleep. Pt is taking trazadone 150mg  last seen 07/31/20.   Please advise

## 2020-08-24 ENCOUNTER — Encounter: Payer: Self-pay | Admitting: Family Medicine

## 2020-08-26 NOTE — Telephone Encounter (Signed)
She can take 2 tabs nightly for 3 nights.  If she has already been taking 2 tabs then she can take 3 tabs nightly.  If that dose still was not effective, then we would need to consider a different medication.

## 2020-09-02 DIAGNOSIS — M654 Radial styloid tenosynovitis [de Quervain]: Secondary | ICD-10-CM | POA: Diagnosis not present

## 2020-09-03 MED ORDER — AMITRIPTYLINE HCL 25 MG PO TABS: ORAL_TABLET | ORAL | 0 refills | Status: DC

## 2020-09-03 NOTE — Telephone Encounter (Signed)
Please call patient: please tell her to discontinue the Remeron since it is not working.  Thought in a different medication called Elavil.  She is to take 1 tab approximately 1 hour before bed.  After 3 nights if needed only, she may increase to 2 tabs before bed  Please schedule her for a follow-up in 4 weeks on insomnia management.   -With me will discuss what is working and not working at that appointment and provide any additional refills needed.

## 2020-09-03 NOTE — Telephone Encounter (Signed)
Spoke with pt regarding medication and instructions.  

## 2020-09-03 NOTE — Addendum Note (Signed)
Addended by: Howard Pouch A on: 09/03/2020 12:46 PM   Modules accepted: Orders

## 2020-09-14 ENCOUNTER — Encounter: Payer: Self-pay | Admitting: Family Medicine

## 2020-09-15 NOTE — Telephone Encounter (Signed)
She can continue the amitriptyline at what ever dose she is currently on and had 1 tab of trazodone.  I know she is getting frustrated, but I will need to see her before making any further changes then mentioned above.

## 2020-09-23 ENCOUNTER — Encounter: Payer: Self-pay | Admitting: Family Medicine

## 2020-09-23 ENCOUNTER — Other Ambulatory Visit: Payer: Self-pay

## 2020-09-23 ENCOUNTER — Ambulatory Visit (INDEPENDENT_AMBULATORY_CARE_PROVIDER_SITE_OTHER): Payer: Medicare HMO | Admitting: Family Medicine

## 2020-09-23 VITALS — BP 113/78 | HR 87 | Temp 98.3°F | Ht 61.0 in | Wt 123.0 lb

## 2020-09-23 DIAGNOSIS — F5101 Primary insomnia: Secondary | ICD-10-CM

## 2020-09-23 DIAGNOSIS — R69 Illness, unspecified: Secondary | ICD-10-CM | POA: Diagnosis not present

## 2020-09-23 MED ORDER — AMITRIPTYLINE HCL 25 MG PO TABS: 25.0000 mg | ORAL_TABLET | Freq: Every day | ORAL | 5 refills | Status: DC

## 2020-09-23 MED ORDER — TRAZODONE HCL 150 MG PO TABS: 150.0000 mg | ORAL_TABLET | Freq: Every day | ORAL | 1 refills | Status: DC

## 2020-09-23 NOTE — Patient Instructions (Signed)
Great to see you today. Message me in about 2 weeks to let me know how the meds are working.    1 tab of trazodone and 25-50 mg of elavil.

## 2020-09-23 NOTE — Progress Notes (Signed)
This visit occurred during the SARS-CoV-2 public health emergency.  Safety protocols were in place, including screening questions prior to the visit, additional usage of staff PPE, and extensive cleaning of exam room while observing appropriate contact time as indicated for disinfecting solutions.    Patient ID: MAURICIA MERTENS, female  DOB: Jul 05, 1955, 65 y.o.   MRN: [id number] Patient Care Team    Relationship Specialty Notifications Start End  Ma Hillock, DO PCP - General Family Medicine  10/26/15   Arvella Nigh, MD Consulting Physician Obstetrics and Gynecology  10/28/15   Tanda Rockers, MD Consulting Physician Pulmonary Disease  10/28/15     Chief Complaint  Patient presents with   Insomnia    Subjective: Krystalle BRETA DEMEDEIROS is a 65 y.o.  Female  present for insomnia follow up after changes to regimen.  Insomnia: Patient had reported waking up in the middle of the night and difficulty getting back to sleep on trazodone 150 mg nightly.  Attempt to change therapy regimen for her for better coverage Remeron was tried and tapered up to 45 mg nightly without benefit.  Patient reports she woke up numerous times throughout the night on this medication and did not sleep well at all.  Remeron was discontinued and patient was tried on amitriptyline and taper to amitriptyline 50 mg nightly.  She reports it was helpful but she still woke up many times.   Documentation only: Asthma/cough:   She is now been seen with by allergy and asthma. They prescribe her symbicort and dymista (tried qvar and breo[too expensive]). She reports compliance  with singulair. She uses zyrtec when needed.   Elevated a1c 6.3 last office visit. Wt about the same.    Depression screen [facility] 2/9 07/31/2020 06/26/2020 05/13/2020 02/03/2020 02/03/2020  Decreased Interest 0 0 0 0 0  Down, Depressed, Hopeless 1 0 1 0 0  PHQ - 2 Score 1 0 1 0 0  Altered sleeping 0 0 - 0 -  Tired, decreased energy 1 0 - 0 -  Change in appetite 0 0  - 0 -  Feeling bad or failure about yourself  0 0 - 0 -  Trouble concentrating 0 0 - 0 -  Moving slowly or fidgety/restless 0 0 - 0 -  Suicidal thoughts 0 0 - 0 -  PHQ-9 Score 2 0 - 0 -  Difficult doing work/chores - - - - -  Some recent data might be hidden   GAD 7 : Generalized Anxiety Score 07/31/2020 12/05/2018 06/04/2018 04/06/2017  Nervous, Anxious, on Edge 1 0 0 0  Control/stop worrying 0 0 0 0  Worry too much - different things 0 0 0 0  Trouble relaxing 0 1 0 0  Restless 0 0 0 0  Easily annoyed or irritable 0 0 0 0  Afraid - awful might happen 0 0 0 0  Total GAD 7 Score 1 1 0 0  Anxiety Difficulty - Not difficult at all Somewhat difficult Not difficult at all    Immunization History  Administered Date(s) Administered   Fluad Quad(high Dose 65+) 12/05/2018, 01/10/2020   Influenza,inj,Quad PF,6+ Mos 03/24/2016, 01/24/2017, 12/04/2017   Influenza-Unspecified 12/26/2013, 01/24/2017   PFIZER(Purple Top)SARS-COV-2 Vaccination 06/02/2019, 07/02/2019, 01/28/2020   Pneumococcal Conjugate-13 02/03/2020   Pneumococcal Polysaccharide-23 11/20/2012   Td 04/29/2020   Tdap 02/03/2009, 04/30/2020   Zoster Recombinat (Shingrix) 03/09/2020, 06/15/2020   Zoster, Live 03/15/2013    Past Medical History:  Diagnosis Date   Allergy  Angio-edema    Asthma    Cataract    Chicken pox    Compression fracture of thoracic spine, non-traumatic (HCC)    T7-8   Depression    GERD (gastroesophageal reflux disease)    Hyperlipidemia    no meds taken   Insomnia    Menopause    Osteopenia    Overactive bladder    TMJ (temporomandibular joint syndrome)    Urinary incontinence    Allergies  Allergen Reactions   Demerol [Meperidine Hcl] Nausea And Vomiting   Nitrofurantoin Nausea Only    swelling   Past Surgical History:  Procedure Laterality Date   BLADDER SUSPENSION  2011   Dr. Matilde Sprang   CARPAL TUNNEL RELEASE Bilateral 2014   COLONOSCOPY     TUBAL LIGATION Bilateral 1978    Family History  Problem Relation Age of Onset   Hypertension Father    CAD Father    Hearing loss Father    Heart disease Father    COPD Father    Hyperlipidemia Mother    Arthritis Mother    Alcohol abuse Paternal Grandfather    Heart attack Paternal Grandfather    CAD Paternal Grandfather    Dementia Paternal Grandmother    Aortic aneurysm Maternal Grandmother    Cerebral palsy Son        Mild CP by records   Kidney nephrosis Son        FSG, kidney transplant.    Colon cancer Neg Hx    Esophageal cancer Neg Hx    Stomach cancer Neg Hx    Rectal cancer Neg Hx    Allergic rhinitis Neg Hx    Asthma Neg Hx    Eczema Neg Hx    Urticaria Neg Hx    Social History   Social History Narrative   Married to [place]). Three children [name] and [name].    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    Takes herbal remedies, daily vitamin,    Wears seatbelt   Exercises 3x a week   Smoke detector in the home.    Firearms in the home (locked)   Feels safe in relationship.     Allergies as of 09/23/2020       Reactions   Demerol [meperidine Hcl] Nausea And Vomiting   Nitrofurantoin Nausea Only   swelling        Medication List        Accurate as of September 23, 2020  3:08 PM. If you have any questions, ask your nurse or doctor.          albuterol 108 (90 Base) MCG/ACT inhaler Commonly known as: VENTOLIN HFA Inhale 1-2 puffs into the lungs every 6 (six) hours as needed for wheezing or shortness of breath.   amitriptyline 25 MG tablet Commonly known as: ELAVIL 1 tab p.o. 1 hour before bed x3 nights.  Then may increase to 2 tab p.o. 1 hour before bed if needed. What changed:  how much to take additional instructions   Azelastine HCl 0.15 % Soln Place 1-2 sprays into the nose 2 (two) times daily as needed (runny nose, post nasal drip).   BIOTIN PO Take by mouth.   budesonide-formoterol 80-4.5 MCG/ACT inhaler Commonly known as: Symbicort Inhale 2 puffs into the  lungs in the morning and at bedtime. Rinse mouth after each use   CALCIUM PLUS VITAMIN D3 PO Take by mouth daily.   fluticasone 50 MCG/ACT nasal spray Commonly known as: Flonase Place 2  sprays into both nostrils daily.   mirtazapine 15 MG disintegrating tablet Commonly known as: REMERON SOL-TAB Take 1 tablet (15 mg total) by mouth at bedtime.   montelukast 10 MG tablet Commonly known as: SINGULAIR Take 1 tablet (10 mg total) by mouth at bedtime.   MULTIVITAMIN PO Take by mouth daily.   Red Yeast Rice 600 MG Caps Take 2 capsules by mouth daily.   VITAMIN B-12 PO Take 2,500 mcg by mouth.        All past medical history, surgical history, allergies, family history, immunizations andmedications were updated in the EMR today and reviewed under the history and medication portions of their EMR.      ROS: 14 pt review of systems performed and negative (unless mentioned in an HPI)  Objective: BP 113/78   Pulse 87   Temp 98.3 F (36.8 C) (Oral)   Ht 5\' 1"  (1.549 m)   Wt 123 lb (55.8 kg)   SpO2 99%   BMI 23.24 kg/m  Gen: Afebrile. No acute distress.  Nontoxic, very pleasant female. HENT: AT. [place].  Neck/lymp/endocrine: Supple, no lymphadenopathy, no thyromegaly CV: RRR  Neuro:  Normal gait. PERLA. EOMi. Alert. Oriented x3 Cranial nerves Psych: Normal affect, dress and demeanor. Normal speech. Normal thought content and judgment.   No results found.  Assessment/plan: ALEXSANDRIA KIVETT is a 65 y.o. female present for [facility] Insomnia, unspecified type/Moderate recurrent major depression ([place]) -See discussion today surrounding her depression and sleep disturbance - Elected to restart trazodone 150 mg nightly. -Decrease amitriptyline to 25 mg nightly, may increase to 50 mg nightly after 3 days if needed only. -Patient would like to avoid any of the controlled substances including hypnotics. -Could consider gabapentin  -Tried: Trazodone, amitriptyline, Remeron - Follow-up every  5.5 months on current issues, sooner if still not sleeping well.  Could consider a sleep specialist to rule out other sleep disorder. She is going to call in in about 2 weeks to let us know how this regimen is working.   Documentation only: Moderate persistent asthma without complication/allergic rhinitis/seasomal allergies - stable.  - continue inhalers prescribed by [facility] - continue  singulair - continue OTC oral antihistamine PRN   Osteopenia, unspecified location followed by GYN DEXA UTD  Elevated a1c 6.3 > 5.4  No follow-ups on file.   No orders of the defined types were placed in this encounter.  No orders of the defined types were placed in this encounter.  Referral Orders  No referral(s) requested today     Electronically signed by: Howard Pouch, [place]

## 2020-09-25 ENCOUNTER — Encounter: Payer: Self-pay | Admitting: Family Medicine

## 2020-09-29 ENCOUNTER — Ambulatory Visit: Payer: Medicare HMO | Admitting: Family Medicine

## 2020-09-30 ENCOUNTER — Telehealth: Payer: Self-pay | Admitting: Allergy

## 2020-09-30 NOTE — Telephone Encounter (Signed)
Patient was last seen in September of 2021 and was due back in January of 2022. She cancelled and rescheduled her appointment twice. I called the patient to inform her that in order for [place] to send in her medications she will need an office visit. I left a message for her to call the office back to make an appointment.

## 2020-10-01 ENCOUNTER — Other Ambulatory Visit: Payer: Self-pay | Admitting: *Deleted

## 2020-10-01 MED ORDER — SYMBICORT 80-4.5 MCG/ACT IN AERO: 2.0000 | INHALATION_SPRAY | Freq: Two times a day (BID) | RESPIRATORY_TRACT | 0 refills | Status: DC

## 2020-10-01 NOTE — Telephone Encounter (Signed)
A courtesy refill has been sent to [place] to get the patient through until her appointment.

## 2020-10-01 NOTE — Telephone Encounter (Signed)
Patient called back and scheduled for 10/22/20 and is requesting a courtesy refill for her inhaler to be sent in. Patient was made aware if she can not keep her appointment she will no longer receive any refills. Patient verbalized understanding.

## 2020-10-21 NOTE — Progress Notes (Signed)
Follow Up Note  RE: Robin Banks MRN: [id number] DOB: 1956/01/20 Date of Office Visit: 10/22/2020  Referring provider: Ma Hillock, DO Primary care provider: Ma Hillock, DO  Chief Complaint: Asthma  History of Present Illness: I had the pleasure of seeing Robin Banks for a follow up visit at the [facility] [place] [facility] [place] on 10/22/2020. She is a 65 y.o. female, who is being followed for asthma and allergic rhinitis. Her previous allergy office visit was on 12/26/2019 with Dr. Maudie Mercury. Today is a regular follow up visit.  Moderate persistent asthma Denies any wheezing, ER/urgent care visits or prednisone use since the last visit. Some shortness of breath for exertional related issues.   Patient had 1 course of prednisone in October.  Currently on Symbicort 66mg 2 puffs once a day in the mornings with good benefit.  No recent rescue inhaler use.    Seasonal and perennial allergic rhinitis Sometimes takes OTC antihistamines in the spring with good benefit. No nasal spray or eye drop use. Still taking Singulair '10mg'$  daily.   Wants to know if it's okay for her to get PCP refill these medications in the future.  Patient has hearing aids and saw ENT who cleaned out her ears.   Assessment and Plan: [name] a 65y.o. female with: Asthma Past history - Diagnosed with asthma over 20 years ago and was using Qvar 40 1-2 puffs QAM and albuterol with good benefit. However she gets flares and prednisone usually helps. History of GERD but not on any medications. 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 Chest x-ray showed no acute process. Breo not covered. Interim history - well controlled and no prednisone this year.  Today's spirometry was normal.   Daily controller medication(s): continue with Symbicort 840m 2 puffs once a day with spacer and rinse mouth afterwards. Continue Singulair  (montelukast) '10mg'$  daily at night. During upper respiratory infections/asthma flares: start Symbicort 8018m2 puffs twice a day for 1-2 weeks until your breathing symptoms return to baseline.  May use albuterol rescue inhaler 2 puffs or nebulizer every 4 to 6 hours as needed for shortness of breath, chest tightness, coughing, and wheezing. May use albuterol rescue inhaler 2 puffs 5 to 15 minutes prior to strenuous physical activities. Monitor frequency of use.   Seasonal and perennial allergic rhinitis Past history - 2011 skin testing was positive to tree pollen, dust mites and one mold. Dymista too expensive Interim history - flares in the spring only.  Continue environmental control measures.  Continue Singulair (montelukast) '10mg'$  daily. Only use if needed:  May use Flonase (fluticasone) nasal spray 2 sprays per nostril once a day as needed for nasal congestion.  May use azelastine nasal spray 1-2 sprays per nostril twice a day as needed for runny nose/drainage. Use over the counter antihistamines such as Zyrtec (cetirizine), Claritin (loratadine), Allegra (fexofenadine), or Xyzal (levocetirizine) daily as needed. May take twice a day during allergy flares. May switch antihistamines every few months.  Return if symptoms worsen or fail to improve. Patient would like PCP to continue medications for her.   Meds ordered this encounter  Medications   montelukast (SINGULAIR) 10 MG tablet    Sig: Take 1 tablet (10 mg total) by mouth at bedtime.    Dispense:  90 tablet    Refill:  3    Hold prescription until patient ready for pick up.   albuterol (VENTOLIN  HFA) 108 (90 Base) MCG/ACT inhaler    Sig: Inhale 1-2 puffs into the lungs every 6 (six) hours as needed for wheezing or shortness of breath.    Dispense:  8 g    Refill:  2    Hold prescription until patient ready for pick up.   SYMBICORT 80-4.5 MCG/ACT inhaler    Sig: Inhale 2 puffs into the lungs in the morning and at bedtime. Rinse  mouth after each use    Dispense:  10.2 g    Refill:  11    Hold prescription until patient ready for pick up.   DISCONTD: fluticasone (FLONASE) 50 MCG/ACT nasal spray    Sig: Place 2 sprays into both nostrils daily as needed.    Dispense:  16 g    Refill:  11   DISCONTD: Azelastine HCl 0.15 % SOLN    Sig: Place 1-2 sprays into the nose 2 (two) times daily as needed (runny nose, post nasal drip).    Dispense:  30 mL    Refill:  11    Lab Orders  No laboratory test(s) ordered today    Diagnostics: Spirometry:  Tracings reviewed. Her effort: Good reproducible efforts. FVC: 2.36L FEV1: 1.76L, 85% predicted FEV1/FVC ratio: 75% Interpretation: Spirometry consistent with normal pattern.  Please see scanned spirometry results for details.  Medication List:  Current Outpatient Medications  Medication Sig Dispense Refill   amitriptyline (ELAVIL) 25 MG tablet Take 1-2 tablets (25-50 mg total) by mouth at bedtime. 60 tablet 5   BIOTIN PO Take by mouth.     Calcium Carb-Cholecalciferol (CALCIUM PLUS VITAMIN D3 PO) Take by mouth daily.     Multiple Vitamin (MULTIVITAMIN PO) Take by mouth daily.     Red Yeast Rice 600 MG CAPS Take 2 capsules by mouth daily.     traZODone (DESYREL) 150 MG tablet Take 1 tablet (150 mg total) by mouth at bedtime. 90 tablet 1   albuterol (VENTOLIN HFA) 108 (90 Base) MCG/ACT inhaler Inhale 1-2 puffs into the lungs every 6 (six) hours as needed for wheezing or shortness of breath. 8 g 2   Cyanocobalamin (VITAMIN B-12 PO) Take 2,500 mcg by mouth. (Patient not taking: Reported on 10/22/2020)     montelukast (SINGULAIR) 10 MG tablet Take 1 tablet (10 mg total) by mouth at bedtime. 90 tablet 3   SYMBICORT 80-4.5 MCG/ACT inhaler Inhale 2 puffs into the lungs in the morning and at bedtime. Rinse mouth after each use 10.2 g 11   No current facility-administered medications for this visit.   Allergies: Allergies  Allergen Reactions   Demerol [Meperidine Hcl] Nausea  And Vomiting   Nitrofurantoin Nausea Only    swelling   I reviewed her past medical history, social history, family history, and environmental history and no significant changes have been reported from her previous visit.  Review of Systems  Constitutional:  Negative for appetite change, chills, fever and unexpected weight change.  HENT:  Negative for congestion, postnasal drip and rhinorrhea.   Eyes:  Negative for itching.  Respiratory:  Negative for cough, chest tightness, shortness of breath and wheezing.   Cardiovascular:  Negative for chest pain.  Gastrointestinal:  Negative for abdominal pain.  Genitourinary:  Negative for difficulty urinating.  Skin:  Negative for rash.  Allergic/Immunologic: Positive for environmental allergies. Negative for food allergies.   Objective: BP 128/76   Pulse 74   Temp 97.9 F (36.6 C) (Temporal)   Resp 12   SpO2 98%  There  is no height or weight on file to calculate BMI. Physical Exam Vitals and nursing note reviewed.  Constitutional:      Appearance: Normal appearance. She is well-developed and normal weight.  HENT:     Head: Normocephalic and atraumatic.     Right Ear: External ear normal. There is no impacted cerumen.     Left Ear: External ear normal. There is no impacted cerumen.     Nose: Nose normal. No congestion or rhinorrhea.     Mouth/Throat:     Mouth: Mucous membranes are moist.     Pharynx: Oropharynx is clear.  Eyes:     Conjunctiva/sclera: Conjunctivae normal.  Cardiovascular:     Rate and Rhythm: Normal rate and regular rhythm.     Heart sounds: Normal heart sounds. No murmur heard.   No friction rub. No gallop.  Pulmonary:     Effort: Pulmonary effort is normal.     Breath sounds: Normal breath sounds. No wheezing or rales.  Musculoskeletal:     Cervical back: Neck supple.  Skin:    General: Skin is warm.     Findings: No rash.  Neurological:     Mental Status: She is alert and oriented to person, place, and  time.  Psychiatric:        Mood and Affect: Mood normal.        Behavior: Behavior normal.  Previous notes and tests were reviewed. The plan was reviewed with the patient/family, and all questions/concerned were addressed.  It was my pleasure to see Jovie today and participate in her care. Please feel free to contact me with any questions or concerns.  Sincerely,  Rexene Alberts, DO Allergy & Immunology  [facility] office: [place] office: [phone]

## 2020-10-22 ENCOUNTER — Other Ambulatory Visit: Payer: Self-pay

## 2020-10-22 ENCOUNTER — Ambulatory Visit: Payer: Medicare HMO | Admitting: Allergy

## 2020-10-22 ENCOUNTER — Telehealth: Payer: Self-pay

## 2020-10-22 ENCOUNTER — Encounter: Payer: Self-pay | Admitting: Allergy

## 2020-10-22 VITALS — BP 128/76 | HR 74 | Temp 97.9°F | Resp 12

## 2020-10-22 DIAGNOSIS — J4540 Moderate persistent asthma, uncomplicated: Secondary | ICD-10-CM

## 2020-10-22 DIAGNOSIS — J302 Other seasonal allergic rhinitis: Secondary | ICD-10-CM

## 2020-10-22 DIAGNOSIS — J3089 Other allergic rhinitis: Secondary | ICD-10-CM

## 2020-10-22 MED ORDER — AZELASTINE HCL 0.15 % NA SOLN: 1.0000 | Freq: Two times a day (BID) | NASAL | 11 refills | Status: DC | PRN

## 2020-10-22 MED ORDER — FLUTICASONE PROPIONATE 50 MCG/ACT NA SUSP: 2.0000 | Freq: Every day | NASAL | 11 refills | Status: DC | PRN

## 2020-10-22 MED ORDER — SYMBICORT 80-4.5 MCG/ACT IN AERO: 2.0000 | INHALATION_SPRAY | Freq: Two times a day (BID) | RESPIRATORY_TRACT | 11 refills | Status: DC

## 2020-10-22 MED ORDER — ALBUTEROL SULFATE HFA 108 (90 BASE) MCG/ACT IN AERS: 1.0000 | INHALATION_SPRAY | Freq: Four times a day (QID) | RESPIRATORY_TRACT | 2 refills | Status: DC | PRN

## 2020-10-22 MED ORDER — MONTELUKAST SODIUM 10 MG PO TABS: 10.0000 mg | ORAL_TABLET | Freq: Every day | ORAL | 3 refills | Status: DC

## 2020-10-22 NOTE — Assessment & Plan Note (Signed)
Past history - Diagnosed with asthma over 20 years ago and was using Qvar 40 1-2 puffs QAM and albuterol with good benefit. However she gets flares and prednisone usually helps. History of GERD but not on any medications. 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 Chest x-ray showed no acute process. Breo not covered. Interim history - well controlled and no prednisone this year.   Today's spirometry was normal.   ? Daily controller medication(s): continue with Symbicort 89mg 2 puffs once a day with spacer and rinse mouth afterwards. ? Continue Singulair (montelukast) '10mg'$  daily at night. ? During upper respiratory infections/asthma flares: start Symbicort 865m 2 puffs twice a day for 1-2 weeks until your breathing symptoms return to baseline.  ? May use albuterol rescue inhaler 2 puffs or nebulizer every 4 to 6 hours as needed for shortness of breath, chest tightness, coughing, and wheezing. May use albuterol rescue inhaler 2 puffs 5 to 15 minutes prior to strenuous physical activities. Monitor frequency of use.

## 2020-10-22 NOTE — Telephone Encounter (Signed)
Patient was to call back in to let Dr. Raoul Pitch know how new sleeping meds were doing. Patient stated they are working fine.   traZODone (DESYREL) 150 MG tablet [id number]

## 2020-10-22 NOTE — Patient Instructions (Addendum)
Asthma:  Daily controller medication(s): continue with Symbicort 38mg 2 puffs once a day with spacer and rinse mouth afterwards. Continue Singulair (montelukast) '10mg'$  daily at night. During upper respiratory infections/asthma flares: start Symbicort 846m 2 puffs twice a day for 1-2 weeks until your breathing symptoms return to baseline.  May use albuterol rescue inhaler 2 puffs or nebulizer every 4 to 6 hours as needed for shortness of breath, chest tightness, coughing, and wheezing. May use albuterol rescue inhaler 2 puffs 5 to 15 minutes prior to strenuous physical activities. Monitor frequency of use.  Asthma control goals:  Full participation in all desired activities (may need albuterol before activity) Albuterol use two times or less a week on average (not counting use with activity) Cough interfering with sleep two times or less a month Oral steroids no more than once a year No hospitalizations  Allergic rhinitis: 2021 allergy testing was positive to tree pollen, dust mites and one mold.  Continue environmental control measures.  Continue Singulair (montelukast) '10mg'$  daily. May use Flonase (fluticasone) nasal spray 2 sprays per nostril once a day as needed for nasal congestion.  May use azelastine nasal spray 1-2 sprays per nostril twice a day as needed for runny nose/drainage. Use over the counter antihistamines such as Zyrtec (cetirizine), Claritin (loratadine), Allegra (fexofenadine), or Xyzal (levocetirizine) daily as needed. May take twice a day during allergy flares. May switch antihistamines every few months.  Follow up as needed as long as Dr. KuRaoul Pitchs willing to refill your asthma and allergy medications.   Reducing Pollen Exposure Pollen seasons: trees (spring), grass (summer) and ragweed/weeds (fall). Keep windows closed in your home and car to lower pollen exposure.  Install air conditioning in the bedroom and throughout the house if possible.  Avoid going out in dry  windy days - especially early morning. Pollen counts are highest between 5 - 10 AM and on dry, hot and windy days.  Save outside activities for late afternoon or after a heavy rain, when pollen levels are lower.  Avoid mowing of grass if you have grass pollen allergy. Be aware that pollen can also be transported indoors on people and pets.  Dry your clothes in an automatic dryer rather than hanging them outside where they might collect pollen.  Rinse hair and eyes before bedtime.  Control of House Dust Mite Allergen Dust mite allergens are a common trigger of allergy and asthma symptoms. While they can be found throughout the house, these microscopic creatures thrive in warm, humid environments such as bedding, upholstered furniture and carpeting. Because so much time is spent in the bedroom, it is essential to reduce mite levels there.  Encase pillows, mattresses, and box springs in special allergen-proof fabric covers or airtight, zippered plastic covers.  Bedding should be washed weekly in hot water (130 F) and dried in a hot dryer. Allergen-proof covers are available for comforters and pillows that can't be regularly washed.  Wash the allergy-proof covers every few months. Minimize clutter in the bedroom. Keep pets out of the bedroom.  Keep humidity less than 50% by using a dehumidifier or air conditioning. You can buy a humidity measuring device called a hygrometer to monitor this.  If possible, replace carpets with hardwood, linoleum, or washable area rugs. If that's not possible, vacuum frequently with a vacuum that has a HEPA filter. Remove all upholstered furniture and non-washable window drapes from the bedroom. Remove all non-washable stuffed toys from the bedroom.  Wash stuffed toys weekly.  Mold Control  Mold and fungi can grow on a variety of surfaces provided certain temperature and moisture conditions exist.  Outdoor molds grow on plants, decaying vegetation and soil. The major  outdoor mold, Alternaria and Cladosporium, are found in very high numbers during hot and dry conditions. Generally, a late summer - fall peak is seen for common outdoor fungal spores. Rain will temporarily lower outdoor mold spore count, but counts rise rapidly when the rainy period ends. The most important indoor molds are Aspergillus and Penicillium. Dark, humid and poorly ventilated basements are ideal sites for mold growth. The next most common sites of mold growth are the bathroom and the kitchen. Outdoor (Seasonal) Mold Control Use air conditioning and keep windows closed. Avoid exposure to decaying vegetation. Avoid leaf raking. Avoid grain handling. Consider wearing a face mask if working in moldy areas.  Indoor (Perennial) Mold Control  Maintain humidity below 50%. Get rid of mold growth on hard surfaces with water, detergent and, if necessary, 5% bleach (do not mix with other cleaners). Then dry the area completely. If mold covers an area more than 10 square feet, consider hiring an indoor environmental professional. For clothing, washing with soap and water is best. If moldy items cannot be cleaned and dried, throw them away. Remove sources e.g. contaminated carpets. Repair and seal leaking roofs or pipes. Using dehumidifiers in damp basements may be helpful, but empty the water and clean units regularly to prevent mildew from forming. All rooms, especially basements, bathrooms and kitchens, require ventilation and cleaning to deter mold and mildew growth. Avoid carpeting on concrete or damp floors, and storing items in damp areas.

## 2020-10-22 NOTE — Telephone Encounter (Signed)
FYI

## 2020-10-22 NOTE — Assessment & Plan Note (Signed)
Past history - 2011 skin testing was positive to tree pollen, dust mites and one mold. Dymista too expensive Interim history - flares in the spring only.   Continue environmental control measures.   Continue Singulair (montelukast) '10mg'$  daily.  Only use if needed:   May use Flonase (fluticasone) nasal spray 2 sprays per nostril once a day as needed for nasal congestion.   May use azelastine nasal spray 1-2 sprays per nostril twice a day as needed for runny nose/drainage.  Use over the counter antihistamines such as Zyrtec (cetirizine), Claritin (loratadine), Allegra (fexofenadine), or Xyzal (levocetirizine) daily as needed. May take twice a day during allergy flares. May switch antihistamines every few months.

## 2020-10-22 NOTE — Addendum Note (Signed)
Addended by: Katherina Right D on: 10/22/2020 04:55 PM   Modules accepted: Orders

## 2020-10-26 DIAGNOSIS — H2513 Age-related nuclear cataract, bilateral: Secondary | ICD-10-CM | POA: Diagnosis not present

## 2020-10-26 DIAGNOSIS — H53451 Other localized visual field defect, right eye: Secondary | ICD-10-CM | POA: Diagnosis not present

## 2020-10-28 DIAGNOSIS — Z01419 Encounter for gynecological examination (general) (routine) without abnormal findings: Secondary | ICD-10-CM | POA: Diagnosis not present

## 2020-10-28 DIAGNOSIS — Z6823 Body mass index (BMI) 23.0-23.9, adult: Secondary | ICD-10-CM | POA: Diagnosis not present

## 2020-10-28 DIAGNOSIS — Z1231 Encounter for screening mammogram for malignant neoplasm of breast: Secondary | ICD-10-CM | POA: Diagnosis not present

## 2020-10-31 LAB — HM MAMMOGRAPHY

## 2020-11-16 ENCOUNTER — Other Ambulatory Visit: Payer: Self-pay | Admitting: Family Medicine

## 2020-11-24 DIAGNOSIS — H40013 Open angle with borderline findings, low risk, bilateral: Secondary | ICD-10-CM | POA: Diagnosis not present

## 2020-11-24 DIAGNOSIS — H18413 Arcus senilis, bilateral: Secondary | ICD-10-CM | POA: Diagnosis not present

## 2020-11-24 DIAGNOSIS — H2513 Age-related nuclear cataract, bilateral: Secondary | ICD-10-CM | POA: Diagnosis not present

## 2020-11-24 DIAGNOSIS — H25043 Posterior subcapsular polar age-related cataract, bilateral: Secondary | ICD-10-CM | POA: Diagnosis not present

## 2020-11-24 DIAGNOSIS — H2511 Age-related nuclear cataract, right eye: Secondary | ICD-10-CM | POA: Diagnosis not present

## 2020-12-26 HISTORY — PX: CATARACT EXTRACTION: SUR2

## 2021-01-01 DIAGNOSIS — H2512 Age-related nuclear cataract, left eye: Secondary | ICD-10-CM | POA: Diagnosis not present

## 2021-01-01 DIAGNOSIS — H2511 Age-related nuclear cataract, right eye: Secondary | ICD-10-CM | POA: Diagnosis not present

## 2021-01-11 DIAGNOSIS — H2511 Age-related nuclear cataract, right eye: Secondary | ICD-10-CM | POA: Diagnosis not present

## 2021-01-15 DIAGNOSIS — G4700 Insomnia, unspecified: Secondary | ICD-10-CM | POA: Diagnosis not present

## 2021-01-15 DIAGNOSIS — Z79899 Other long term (current) drug therapy: Secondary | ICD-10-CM | POA: Diagnosis not present

## 2021-01-15 DIAGNOSIS — K219 Gastro-esophageal reflux disease without esophagitis: Secondary | ICD-10-CM | POA: Diagnosis not present

## 2021-01-15 DIAGNOSIS — Z7951 Long term (current) use of inhaled steroids: Secondary | ICD-10-CM | POA: Diagnosis not present

## 2021-01-15 DIAGNOSIS — Z825 Family history of asthma and other chronic lower respiratory diseases: Secondary | ICD-10-CM | POA: Diagnosis not present

## 2021-01-15 DIAGNOSIS — J45909 Unspecified asthma, uncomplicated: Secondary | ICD-10-CM | POA: Diagnosis not present

## 2021-01-15 DIAGNOSIS — Z8249 Family history of ischemic heart disease and other diseases of the circulatory system: Secondary | ICD-10-CM | POA: Diagnosis not present

## 2021-01-15 DIAGNOSIS — M1990 Unspecified osteoarthritis, unspecified site: Secondary | ICD-10-CM | POA: Diagnosis not present

## 2021-01-15 DIAGNOSIS — R030 Elevated blood-pressure reading, without diagnosis of hypertension: Secondary | ICD-10-CM | POA: Diagnosis not present

## 2021-01-15 DIAGNOSIS — R32 Unspecified urinary incontinence: Secondary | ICD-10-CM | POA: Diagnosis not present

## 2021-01-26 HISTORY — PX: CATARACT EXTRACTION: SUR2

## 2021-01-29 DIAGNOSIS — H2512 Age-related nuclear cataract, left eye: Secondary | ICD-10-CM | POA: Diagnosis not present

## 2021-02-09 DIAGNOSIS — H2512 Age-related nuclear cataract, left eye: Secondary | ICD-10-CM | POA: Diagnosis not present

## 2021-02-23 ENCOUNTER — Encounter: Payer: Self-pay | Admitting: Family Medicine

## 2021-02-23 ENCOUNTER — Other Ambulatory Visit: Payer: Self-pay

## 2021-02-23 ENCOUNTER — Ambulatory Visit (INDEPENDENT_AMBULATORY_CARE_PROVIDER_SITE_OTHER): Payer: Medicare HMO | Admitting: Family Medicine

## 2021-02-23 VITALS — BP 97/64 | HR 82 | Temp 98.8°F | Ht 61.0 in | Wt 125.0 lb

## 2021-02-23 DIAGNOSIS — M8580 Other specified disorders of bone density and structure, unspecified site: Secondary | ICD-10-CM

## 2021-02-23 DIAGNOSIS — F5101 Primary insomnia: Secondary | ICD-10-CM | POA: Diagnosis not present

## 2021-02-23 DIAGNOSIS — Z79899 Other long term (current) drug therapy: Secondary | ICD-10-CM | POA: Diagnosis not present

## 2021-02-23 DIAGNOSIS — R7989 Other specified abnormal findings of blood chemistry: Secondary | ICD-10-CM | POA: Diagnosis not present

## 2021-02-23 DIAGNOSIS — M899 Disorder of bone, unspecified: Secondary | ICD-10-CM | POA: Diagnosis not present

## 2021-02-23 DIAGNOSIS — J302 Other seasonal allergic rhinitis: Secondary | ICD-10-CM | POA: Diagnosis not present

## 2021-02-23 DIAGNOSIS — Z13220 Encounter for screening for lipoid disorders: Secondary | ICD-10-CM

## 2021-02-23 DIAGNOSIS — Z130 Encounter for screening for diseases of the blood and blood-forming organs and certain disorders involving the immune mechanism: Secondary | ICD-10-CM | POA: Diagnosis not present

## 2021-02-23 DIAGNOSIS — Z23 Encounter for immunization: Secondary | ICD-10-CM

## 2021-02-23 DIAGNOSIS — R69 Illness, unspecified: Secondary | ICD-10-CM | POA: Diagnosis not present

## 2021-02-23 DIAGNOSIS — Z0000 Encounter for general adult medical examination without abnormal findings: Secondary | ICD-10-CM

## 2021-02-23 DIAGNOSIS — R7309 Other abnormal glucose: Secondary | ICD-10-CM | POA: Diagnosis not present

## 2021-02-23 DIAGNOSIS — J3089 Other allergic rhinitis: Secondary | ICD-10-CM | POA: Diagnosis not present

## 2021-02-23 DIAGNOSIS — J4540 Moderate persistent asthma, uncomplicated: Secondary | ICD-10-CM

## 2021-02-23 DIAGNOSIS — F331 Major depressive disorder, recurrent, moderate: Secondary | ICD-10-CM

## 2021-02-23 DIAGNOSIS — R7401 Elevation of levels of liver transaminase levels: Secondary | ICD-10-CM

## 2021-02-23 NOTE — Patient Instructions (Signed)
Great to see you today.  I have refilled the medication(s) we provide.   If labs were collected, we will inform you of lab results once received either by echart message or telephone call.   - echart message- for normal results that have been seen by the patient already.   - telephone call: abnormal results or if patient has not viewed results in their echart.  Health Maintenance After Age 67 After age 38, you are at a higher risk for certain long-term diseases and infections as well as injuries from falls. Falls are a major cause of broken bones and head injuries in people who are older than age 28. Getting regular preventive care can help to keep you healthy and well. Preventive care includes getting regular testing and making lifestyle changes as recommended by your health care provider. Talk with your health care provider about: Which screenings and tests you should have. A screening is a test that checks for a disease when you have no symptoms. A diet and exercise plan that is right for you. What should I know about screenings and tests to prevent falls? Screening and testing are the best ways to find a health problem early. Early diagnosis and treatment give you the best chance of managing medical conditions that are common after age 76. Certain conditions and lifestyle choices may make you more likely to have a fall. Your health care provider may recommend: Regular vision checks. Poor vision and conditions such as cataracts can make you more likely to have a fall. If you wear glasses, make sure to get your prescription updated if your vision changes. Medicine review. Work with your health care provider to regularly review all of the medicines you are taking, including over-the-counter medicines. Ask your health care provider about any side effects that may make you more likely to have a fall. Tell your health care provider if any medicines that you take make you feel dizzy or sleepy. Strength  and balance checks. Your health care provider may recommend certain tests to check your strength and balance while standing, walking, or changing positions. Foot health exam. Foot pain and numbness, as well as not wearing proper footwear, can make you more likely to have a fall. Screenings, including: Osteoporosis screening. Osteoporosis is a condition that causes the bones to get weaker and break more easily. Blood pressure screening. Blood pressure changes and medicines to control blood pressure can make you feel dizzy. Depression screening. You may be more likely to have a fall if you have a fear of falling, feel depressed, or feel unable to do activities that you used to do. Alcohol use screening. Using too much alcohol can affect your balance and may make you more likely to have a fall. Follow these instructions at home: Lifestyle Do not drink alcohol if: Your health care provider tells you not to drink. If you drink alcohol: Limit how much you have to: 0-1 drink a day for women. 0-2 drinks a day for men. Know how much alcohol is in your drink. In the [place] one drink equals one 12 oz bottle of beer (355 mL), one 5 oz glass of wine (148 mL), or one 1 oz glass of hard liquor (44 mL). Do not use any products that contain nicotine or tobacco. These products include cigarettes, chewing tobacco, and vaping devices, such as e-cigarettes. If you need help quitting, ask your health care provider. Activity  Follow a regular exercise program to stay fit. This will help you  maintain your balance. Ask your health care provider what types of exercise are appropriate for you. If you need a cane or walker, use it as recommended by your health care provider. Wear supportive shoes that have nonskid soles. Safety  Remove any tripping hazards, such as rugs, cords, and clutter. Install safety equipment such as grab bars in bathrooms and safety rails on stairs. Keep rooms and walkways well-lit. General  instructions Talk with your health care provider about your risks for falling. Tell your health care provider if: You fall. Be sure to tell your health care provider about all falls, even ones that seem minor. You feel dizzy, tiredness (fatigue), or off-balance. Take over-the-counter and prescription medicines only as told by your health care provider. These include supplements. Eat a healthy diet and maintain a healthy weight. A healthy diet includes low-fat dairy products, low-fat (lean) meats, and fiber from whole grains, beans, and lots of fruits and vegetables. Stay current with your vaccines. Schedule regular health, dental, and eye exams. Summary Having a healthy lifestyle and getting preventive care can help to protect your health and wellness after age 35. Screening and testing are the best way to find a health problem early and help you avoid having a fall. Early diagnosis and treatment give you the best chance for managing medical conditions that are more common for people who are older than age 49. Falls are a major cause of broken bones and head injuries in people who are older than age 61. Take precautions to prevent a fall at home. Work with your health care provider to learn what changes you can make to improve your health and wellness and to prevent falls. This information is not intended to replace advice given to you by your health care provider. Make sure you discuss any questions you have with your health care provider. Document Revised: 08/03/2020 Document Reviewed: 08/03/2020 Elsevier Patient Education  [place].

## 2021-02-23 NOTE — Progress Notes (Signed)
This visit occurred during the SARS-CoV-2 public health emergency.  Safety protocols were in place, including screening questions prior to the visit, additional usage of staff PPE, and extensive cleaning of exam room while observing appropriate contact time as indicated for disinfecting solutions.    Patient ID: Robin Banks, female  DOB: 10-14-1955, 65 y.o.   MRN: [id number] Patient Care Team    Relationship Specialty Notifications Start End  Ma Hillock, DO PCP - General Family Medicine  10/26/15   Arvella Nigh, MD Consulting Physician Obstetrics and Gynecology  10/28/15   Tanda Rockers, MD Consulting Physician Pulmonary Disease  10/28/15   Doran Stabler, MD Consulting Physician Gastroenterology  02/23/21     Chief Complaint  Patient presents with   Annual Exam    Pt is not fasting    Subjective:  Robin Banks is a 65 y.o.  Female  present for CPE [facility]. All past medical history, surgical history, allergies, family history, immunizations, medications and social history were updated in the electronic medical record today. All recent labs, ED visits and hospitalizations within the last year were reviewed.  Health maintenance:  Colonoscopy: completed:05/23/2019- Dr. Loletha Carrow- 10 yr.  Mammogram: completed: mammogram and pelvic at GYN (Dr. Leatrice Jewels Comb). SBE routinely. Last mammogram 2021- scheduled 04/2020 Cervical cancer screening: see above, LMP at 47. Has GYN.  Immunizations: tdap UTD 2022, Influenza UTD 2022 (encouraged yearly), PNA completed series after 65 today,  zostavax 2014, shingrix series completed 2022 Infectious disease : hep c completed  DEXA: 2021. -1.9 right femoral neck. Osteopenia rt hip, she takes calcium and vitamin D and weight bearing exercise Assistive device: none Oxygen ZDG:LOVF Patient has a Dental home. Hospitalizations/ED visits: reviewed  Insomnia: Patient reported she is sleeping ok just on the trazodone alone with MACA supplement.     Asthma/cough:   She is now been seen with by allergy and asthma. They prescribe her symbicort and dymista (tried qvar and breo[too expensive]). She reports compliance  with singulair. She uses zyrtec when needed.    Elevated a1c 6.3> 5.4 last office visit. Wt about the same.   Depression screen [facility] 2/9 09/23/2020 07/31/2020 06/26/2020 05/13/2020 02/03/2020  Decreased Interest 1 0 0 0 0  Down, Depressed, Hopeless 1 1 0 1 0  PHQ - 2 Score 2 1 0 1 0  Altered sleeping 3 0 0 - 0  Tired, decreased energy 1 1 0 - 0  Change in appetite 0 0 0 - 0  Feeling bad or failure about yourself  0 0 0 - 0  Trouble concentrating 0 0 0 - 0  Moving slowly or fidgety/restless 0 0 0 - 0  Suicidal thoughts 0 0 0 - 0  PHQ-9 Score 6 2 0 - 0  Difficult doing work/chores - - - - -  Some recent data might be hidden   GAD 7 : Generalized Anxiety Score 09/23/2020 07/31/2020 12/05/2018 06/04/2018  Nervous, Anxious, on Edge 1 1 0 0  Control/stop worrying 0 0 0 0  Worry too much - different things 0 0 0 0  Trouble relaxing 0 0 1 0  Restless 0 0 0 0  Easily annoyed or irritable 0 0 0 0  Afraid - awful might happen 0 0 0 0  Total GAD 7 Score 1 1 1  0  Anxiety Difficulty - - Not difficult at all Somewhat difficult   Immunization History  Administered Date(s) Administered   Fluad Quad(high Dose 65+) 12/05/2018, 01/10/2020  Influenza,inj,Quad PF,6+ Mos 03/24/2016, 01/24/2017, 12/04/2017   Influenza-Unspecified 12/26/2013, 01/24/2017, 12/26/2020   PFIZER(Purple Top)SARS-COV-2 Vaccination 06/02/2019, 07/02/2019, 01/28/2020   Pneumococcal Conjugate-13 02/03/2020   Pneumococcal Polysaccharide-23 11/20/2012, 02/23/2021   Td 04/29/2020   Tdap 02/03/2009, 04/30/2020   Zoster Recombinat (Shingrix) 03/09/2020, 06/15/2020   Zoster, Live 03/15/2013   Past Medical History:  Diagnosis Date   Allergy    Angio-edema    Asthma    Cataract    Chicken pox    Compression fracture of thoracic spine, non-traumatic (HCC)    T7-8    Depression    Excess ear wax    GERD (gastroesophageal reflux disease)    Hyperlipidemia    no meds taken   Insomnia    Menopause    Osteopenia    Overactive bladder    TMJ (temporomandibular joint syndrome)    Urinary incontinence    Allergies  Allergen Reactions   Demerol [Meperidine Hcl] Nausea And Vomiting   Nitrofurantoin Nausea Only    swelling   Past Surgical History:  Procedure Laterality Date   BLADDER SUSPENSION  2011   Dr. Matilde Sprang   CARPAL TUNNEL RELEASE Bilateral 2014   CATARACT EXTRACTION  12/2020   CATARACT EXTRACTION  01/2021   COLONOSCOPY     TUBAL LIGATION Bilateral 1978   Family History  Problem Relation Age of Onset   Hypertension Father    CAD Father    Hearing loss Father    Heart disease Father    COPD Father    Hyperlipidemia Mother    Arthritis Mother    Alcohol abuse Paternal Grandfather    Heart attack Paternal Grandfather    CAD Paternal Grandfather    Dementia Paternal Grandmother    Aortic aneurysm Maternal Grandmother    Cerebral palsy Son        Mild CP by records   Kidney nephrosis Son        FSG, kidney transplant.    Colon cancer Neg Hx    Esophageal cancer Neg Hx    Stomach cancer Neg Hx    Rectal cancer Neg Hx    Allergic rhinitis Neg Hx    Asthma Neg Hx    Eczema Neg Hx    Urticaria Neg Hx    Social History   Social History Narrative   Married to Poth). Three children [name] and [name].    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    Takes herbal remedies, daily vitamin,    Wears seatbelt   Exercises 3x a week   Smoke detector in the home.    Firearms in the home (locked)   Feels safe in relationship.     Allergies as of 02/23/2021       Reactions   Demerol [meperidine Hcl] Nausea And Vomiting   Nitrofurantoin Nausea Only   swelling        Medication List        Accurate as of February 23, 2021  4:05 PM. If you have any questions, ask your nurse or doctor.          STOP taking these  medications    amitriptyline 25 MG tablet Commonly known as: ELAVIL Stopped by: Howard Pouch, DO       TAKE these medications    albuterol 108 (90 Base) MCG/ACT inhaler Commonly known as: VENTOLIN HFA Inhale 1-2 puffs into the lungs every 6 (six) hours as needed for wheezing or shortness of breath.   BIOTIN PO Take by mouth.  CALCIUM PLUS VITAMIN D3 PO Take by mouth daily.   fluticasone 50 MCG/ACT nasal spray Commonly known as: FLONASE Place into both nostrils.   MACA PO Take by mouth.   montelukast 10 MG tablet Commonly known as: SINGULAIR Take 1 tablet (10 mg total) by mouth at bedtime.   MULTIVITAMIN PO Take by mouth daily.   Prolensa 0.07 % Soln Generic drug: Bromfenac Sodium SMARTSIG:1 Drop(s) Left Eye Every Evening   Red Yeast Rice 600 MG Caps Take 2 capsules by mouth daily.   Symbicort 80-4.5 MCG/ACT inhaler Generic drug: budesonide-formoterol Inhale 2 puffs into the lungs in the morning and at bedtime. Rinse mouth after each use   traZODone 150 MG tablet Commonly known as: DESYREL Take 1 tablet (150 mg total) by mouth at bedtime.   VITAMIN B-12 PO Take 2,500 mcg by mouth.        All past medical history, surgical history, allergies, family history, immunizations andmedications were updated in the EMR today and reviewed under the history and medication portions of their EMR.     No results found for this or any previous visit (from the past 2160 hour(s)).    ROS: 14 pt review of systems performed and negative (unless mentioned in an HPI)  Objective: BP 97/64   Pulse 82   Temp 98.8 F (37.1 C) (Temporal)   Ht 5\' 1"  (1.549 m)   Wt 125 lb (56.7 kg)   SpO2 99%   BMI 23.62 kg/m  Gen: Afebrile. No acute distress. Nontoxic in appearance, well-developed, well-nourished,  very pleasant female.  HENT: AT. [place]. Bilateral TM visualized and normal in appearance, normal external auditory canal. MMM, no oral lesions, adequate dentition. Bilateral  nares within normal limits. Throat without erythema, ulcerations or exudates. no Cough on exam, no hoarseness on exam. Eyes:Pupils Equal Round Reactive to light, Extraocular movements intact,  Conjunctiva without redness, discharge or icterus. Neck/lymp/endocrine: Supple,no lymphadenopathy, no thyromegaly CV: RRR no murmur, no edema, +2/4 P posterior tibialis pulses.  Chest: CTAB, no wheeze, rhonchi or crackles. normal Respiratory effort. good Air movement. Abd: Soft. flat. NTND. BS present. no Masses palpated. No hepatosplenomegaly. No rebound tenderness or guarding. Skin: no rashes, purpura or petechiae. Warm and well-perfused. Skin intact. Neuro/Msk:  Normal gait. PERLA. EOMi. Alert. Oriented x3.  Cranial nerves II through XII intact. Muscle strength 5/5 upper/lower extremity. DTRs equal bilaterally. Psych: Normal affect, dress and demeanor. Normal speech. Normal thought content and judgment.   No results found.  Assessment/plan: RUCHEL BRANDENBURGER is a 65 y.o. female present for CPE/CMC Insomnia, unspecified type/Moderate recurrent major depression ([place]) -Stable.  - continue trazodone 150 mg nightly. -Patient would like to avoid any of the controlled substances including hypnotics. -Could consider gabapentin  -Tried: Trazodone, amitriptyline, Remeron - Follow-up every 5.5 months on current issues, sooner if still not sleeping well.  Could consider a sleep specialist to rule out other sleep disorder.  Moderate persistent asthma without complication/allergic rhinitis/seasomal allergies - stable - continue inhalers prescribed by A&A - continue Singulair - continue OTC oral antihistamine PRN    Elevated a1c 6.3 > 5.4> A1c collected today  Need for pneumococcal vaccine - Pneumococcal polysaccharide vaccine 23-valent greater than or equal to 2yo subcutaneous/IM Primary insomnia - TSH Elevated hemoglobin A1c - Hemoglobin A1c Lipid screening - Lipid panel Screening for deficiency  anemia - CBC Encounter for long-term current use of medication - Comprehensive metabolic panel  Disorder of bone/osteopenia - Vitamin D (25 hydroxy) She supplements with 1500 mg calcium  with vit d (unknown total dose) Dexa completed at gyn UTD Routine general medical examination at a health care facility Colonoscopy: completed:05/23/2019- Dr. Loletha Carrow- 10 yr.  Mammogram: completed: mammogram and pelvic at GYN (Dr. Leatrice Jewels Comb). SBE routinely. Last mammogram 2021- scheduled 04/2020 Cervical cancer screening: see above, LMP at 47. Has GYN.  Immunizations: tdap UTD 2022, Influenza UTD 2022 (encouraged yearly), PNA completed series after 65 today,  zostavax 2014, shingrix series completed 2022 Infectious disease : hep c completed  DEXA: 2021. -1.9 right femoral neck. Osteopenia rt hip, she takes calcium and vitamin D and weight bearing exercise Patient was encouraged to exercise greater than 150 minutes a week. Patient was encouraged to choose a diet filled with fresh fruits and vegetables, and lean meats. AVS provided to patient today for education/recommendation on gender specific health and safety maintenance. Return in about 1 year (around 02/24/2022) for CPE (30 min).    Orders Placed This Encounter  Procedures   Pneumococcal polysaccharide vaccine 23-valent greater than or equal to 2yo subcutaneous/IM   CBC   Comprehensive metabolic panel   Hemoglobin A1c   Lipid panel   TSH   Vitamin D (25 hydroxy)   No orders of the defined types were placed in this encounter.  Referral Orders  No referral(s) requested today     Electronically signed by: Howard Pouch, [place]

## 2021-02-24 DIAGNOSIS — G5603 Carpal tunnel syndrome, bilateral upper limbs: Secondary | ICD-10-CM | POA: Diagnosis not present

## 2021-02-24 DIAGNOSIS — M79642 Pain in left hand: Secondary | ICD-10-CM | POA: Insufficient documentation

## 2021-02-24 DIAGNOSIS — M654 Radial styloid tenosynovitis [de Quervain]: Secondary | ICD-10-CM | POA: Diagnosis not present

## 2021-02-24 LAB — COMPREHENSIVE METABOLIC PANEL
AG Ratio: 1.5 (calc) (ref 1.0–2.5)
ALT: 62 U/L — ABNORMAL HIGH (ref 6–29)
AST: 32 U/L (ref 10–35)
Albumin: 4 g/dL (ref 3.6–5.1)
Alkaline phosphatase (APISO): 77 U/L (ref 37–153)
BUN: 17 mg/dL (ref 7–25)
CO2: 30 mmol/L (ref 20–32)
Calcium: 10 mg/dL (ref 8.6–10.4)
Chloride: 99 mmol/L (ref 98–110)
Creat: 0.83 mg/dL (ref 0.50–1.05)
Globulin: 2.6 g/dL (calc) (ref 1.9–3.7)
Glucose, Bld: 81 mg/dL (ref 65–99)
Potassium: 4 mmol/L (ref 3.5–5.3)
Sodium: 137 mmol/L (ref 135–146)
Total Bilirubin: 0.4 mg/dL (ref 0.2–1.2)
Total Protein: 6.6 g/dL (ref 6.1–8.1)

## 2021-02-24 LAB — CBC
HCT: 38.7 % (ref 35.0–45.0)
Hemoglobin: 12.8 g/dL (ref 11.7–15.5)
MCH: 31.3 pg (ref 27.0–33.0)
MCHC: 33.1 g/dL (ref 32.0–36.0)
MCV: 94.6 fL (ref 80.0–100.0)
MPV: 10.1 fL (ref 7.5–12.5)
Platelets: 270 10*3/uL (ref 140–400)
RBC: 4.09 10*6/uL (ref 3.80–5.10)
RDW: 12.4 % (ref 11.0–15.0)
WBC: 4.3 10*3/uL (ref 3.8–10.8)

## 2021-02-24 LAB — VITAMIN D 25 HYDROXY (VIT D DEFICIENCY, FRACTURES): Vit D, 25-Hydroxy: 51 ng/mL (ref 30–100)

## 2021-02-24 LAB — LIPID PANEL
Cholesterol: 216 mg/dL — ABNORMAL HIGH (ref <200)
HDL: 73 mg/dL (ref >=50)
LDL Cholesterol (Calc): 127 mg/dL (calc) — ABNORMAL HIGH
Non-HDL Cholesterol (Calc): 143 mg/dL (calc) — ABNORMAL HIGH (ref <130)
Total CHOL/HDL Ratio: 3 (calc) (ref <5.0)
Triglycerides: 69 mg/dL (ref <150)

## 2021-02-24 LAB — HEMOGLOBIN A1C
Hgb A1c MFr Bld: 5.8 % of total Hgb — ABNORMAL HIGH (ref <5.7)
Mean Plasma Glucose: 120 mg/dL
eAG (mmol/L): 6.6 mmol/L

## 2021-02-24 LAB — TSH: TSH: 2.03 mIU/L (ref 0.40–4.50)

## 2021-02-24 NOTE — Addendum Note (Signed)
Addended by: Octaviano Glow on: 02/24/2021 03:25 PM   Modules accepted: Orders

## 2021-03-10 ENCOUNTER — Ambulatory Visit: Payer: Medicare HMO

## 2021-03-12 ENCOUNTER — Other Ambulatory Visit: Payer: Self-pay

## 2021-03-12 ENCOUNTER — Ambulatory Visit (INDEPENDENT_AMBULATORY_CARE_PROVIDER_SITE_OTHER): Payer: Medicare HMO

## 2021-03-12 DIAGNOSIS — R7989 Other specified abnormal findings of blood chemistry: Secondary | ICD-10-CM

## 2021-03-12 DIAGNOSIS — R7401 Elevation of levels of liver transaminase levels: Secondary | ICD-10-CM | POA: Diagnosis not present

## 2021-03-12 LAB — ALT: ALT: 25 U/L (ref 0–35)

## 2021-04-15 DIAGNOSIS — M654 Radial styloid tenosynovitis [de Quervain]: Secondary | ICD-10-CM | POA: Diagnosis not present

## 2021-05-12 DIAGNOSIS — G5601 Carpal tunnel syndrome, right upper limb: Secondary | ICD-10-CM | POA: Diagnosis not present

## 2021-06-02 ENCOUNTER — Ambulatory Visit: Payer: Medicare HMO

## 2021-06-09 ENCOUNTER — Other Ambulatory Visit: Payer: Self-pay

## 2021-06-09 NOTE — Progress Notes (Signed)
This encounter was created in error - please disregard. Pt no showed left message for AWV  ?

## 2021-06-15 ENCOUNTER — Ambulatory Visit (INDEPENDENT_AMBULATORY_CARE_PROVIDER_SITE_OTHER): Payer: Medicare HMO

## 2021-06-15 ENCOUNTER — Other Ambulatory Visit: Payer: Self-pay

## 2021-06-15 DIAGNOSIS — Z0000 Encounter for general adult medical examination without abnormal findings: Secondary | ICD-10-CM

## 2021-06-15 NOTE — Progress Notes (Signed)
? ?Subjective:  ? Robin Banks is a 66 y.o. female who presents for Medicare Annual (Subsequent) preventive examination. ? ?Review of Systems    ?I connected with  Robin Banks on 06/15/21 by an audio only telemedicine application and verified that I am speaking with the correct person using two identifiers. ?  ?I discussed the limitations, risks, security and privacy concerns of performing an evaluation and management service by telephone and the availability of in person appointments. I also discussed with the patient that there may be a patient responsible charge related to this service. The patient expressed understanding and verbally consented to this telephonic visit. ? ?Location of Patient: home ?Location of Provider: office ? ?List any persons and their role that are participating in the visit with the patient.  ? ?Robin Banks ?Robin Banks ? ?Cardiac Risk Factors include: advanced age (>80mn, >>35women) ? ?   ?Objective:  ?  ?There were no vitals filed for this visit. ?There is no height or weight on file to calculate BMI. ? ?Advanced Directives 05/13/2020  ?Does Patient Have a Medical Advance Directive? Yes  ?[place] of [place]  ?Copy of [place]? No - copy requested  ? ? ?Current Medications (verified) ?Outpatient Encounter Medications as of 06/15/2021  ?Medication Sig  ? albuterol (VENTOLIN HFA) 108 (90 Base) MCG/ACT inhaler Inhale 1-2 puffs into the lungs every 6 (six) hours as needed for wheezing or shortness of breath.  ? BIOTIN PO Take by mouth.  ? Calcium Carb-Cholecalciferol (CALCIUM PLUS VITAMIN D3 PO) Take by mouth daily.  ? fluticasone (FLONASE) 50 MCG/ACT nasal spray Place into both nostrils.  ? Maca Root (MACA PO) Take by mouth.  ? montelukast (SINGULAIR) 10 MG tablet Take 1 tablet (10 mg total) by mouth at bedtime.  ? Multiple Vitamin (MULTIVITAMIN PO) Take by mouth daily.  ? PROLENSA 0.07 % SOLN SMARTSIG:1  Drop(s) Left Eye Every Evening  ? Red Yeast Rice 600 MG CAPS Take 2 capsules by mouth daily.  ? SYMBICORT 80-4.5 MCG/ACT inhaler Inhale 2 puffs into the lungs in the morning and at bedtime. Rinse mouth after each use  ? traZODone (DESYREL) 150 MG tablet Take 1 tablet (150 mg total) by mouth at bedtime.  ? ?No facility-administered encounter medications on file as of 06/15/2021.  ? ? ?Allergies (verified) ?Demerol [meperidine hcl] and Nitrofurantoin  ? ?History: ?Past Medical History:  ?Diagnosis Date  ? Allergy   ? Angio-edema   ? Asthma   ? Cataract   ? Chicken pox   ? Compression fracture of thoracic spine, non-traumatic (HCC)   ? T7-8  ? Depression   ? Excess ear wax   ? GERD (gastroesophageal reflux disease)   ? Hyperlipidemia   ? no meds taken  ? Insomnia   ? Menopause   ? Osteopenia   ? Overactive bladder   ? TMJ (temporomandibular joint syndrome)   ? Urinary incontinence   ? ?Past Surgical History:  ?Procedure Laterality Date  ? BLADDER SUSPENSION  2011  ? Dr. MMatilde Sprang ? CARPAL TUNNEL RELEASE Bilateral 2014  ? CATARACT EXTRACTION  12/2020  ? CATARACT EXTRACTION  01/2021  ? COLONOSCOPY    ? TUBAL LIGATION Bilateral 1978  ? ?Family History  ?Problem Relation Age of Onset  ? Hypertension Father   ? CAD Father   ? Hearing loss Father   ? Heart disease Father   ? COPD Father   ?  Hyperlipidemia Mother   ? Arthritis Mother   ? Alcohol abuse Paternal Grandfather   ? Heart attack Paternal Grandfather   ? CAD Paternal Grandfather   ? Dementia Paternal Grandmother   ? Aortic aneurysm Maternal Grandmother   ? Cerebral palsy Son   ?     Mild CP by records  ? Kidney nephrosis Son   ?     FSG, kidney transplant.   ? Colon cancer Neg Hx   ? Esophageal cancer Neg Hx   ? Stomach cancer Neg Hx   ? Rectal cancer Neg Hx   ? Allergic rhinitis Neg Hx   ? Asthma Neg Hx   ? Eczema Neg Hx   ? Urticaria Neg Hx   ? ?Social History  ? ?Socioeconomic History  ? Marital status: Married  ?  Spouse name: Not on file  ? Number of children:  Not on file  ? Years of education: Not on file  ? Highest education level: Not on file  ?Occupational History  ? Not on file  ?Tobacco Use  ? Smoking status: Never  ? Smokeless tobacco: Never  ?Vaping Use  ? Vaping Use: Never used  ?Substance and Sexual Activity  ? Alcohol use: Yes  ?  Alcohol/week: 1.0 standard drink  ?  Types: 1 Glasses of wine per week  ? Drug use: No  ? Sexual activity: Yes  ?  Partners: Male  ?  Comment: married  ?Other Topics Concern  ? Not on file  ?Social History Narrative  ? Married to Robin Banks). Three children Robin Banks and Robin Banks.   ? 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   ? Takes herbal remedies, daily vitamin,   ? Wears seatbelt  ? Exercises 3x a week  ? Smoke detector in the home.   ? Firearms in the home (locked)  ? Feels safe in relationship.   ? ?Social Determinants of Health  ? ?Financial Resource Strain: Low Risk   ? Difficulty of Paying Banks Expenses: Not hard at all  ?Food Insecurity: No Food Insecurity  ? Worried About Charity fundraiser in the Last Year: Never true  ? Ran Out of Food in the Last Year: Never true  ?Transportation Needs: No Transportation Needs  ? Lack of Transportation (Medical): No  ? Lack of Transportation (Non-Medical): No  ?Physical Activity: Sufficiently Active  ? Days of Exercise per Week: 4 days  ? Minutes of Exercise per Session: 60 min  ?Stress: No Stress Concern Present  ? Feeling of Stress : Not at all  ?Social Connections: Socially Integrated  ? Frequency of Communication with Friends and Family: More than three times a week  ? Frequency of Social Gatherings with Friends and Family: More than three times a week  ? Attends Religious Services: More than 4 times per year  ? Active Member of Clubs or Organizations: Yes  ? Attends Archivist Meetings: 1 to 4 times per year  ? Marital Status: Married  ? ? ?Tobacco Counseling ?Counseling given: Not Answered ? ? ?Clinical Intake: ? ?Pre-visit preparation completed: No ? ?Pain : No/denies  pain ? ?  ? ?Nutritional Risks: None ?Diabetes: No ? ?How often do you need to have someone help you when you read instructions, pamphlets, or other written materials from your doctor or pharmacy?: 1 - Never ? ?Diabetic?no ? ?Interpreter Needed?: No ? ?  ? ? ?Activities of Daily Banks ?In your present state of health, do you have any difficulty performing the  following activities: 06/15/2021  ?Hearing? N  ?Vision? N  ?Difficulty concentrating or making decisions? N  ?Walking or climbing stairs? N  ?Dressing or bathing? N  ?Doing errands, shopping? N  ?Preparing Food and eating ? N  ?Using the Toilet? N  ?In the past six months, have you accidently leaked urine? N  ?Do you have problems with loss of bowel control? N  ?Managing your Medications? N  ?Managing your Finances? N  ?Housekeeping or managing your Housekeeping? N  ?Some recent data might be hidden  ? ? ?Patient Care Team: ?Ma Hillock, DO as PCP - Banks (Family Medicine) ?Arvella Nigh, MD as Consulting Physician (Obstetrics and Gynecology) ?Tanda Rockers, MD as Consulting Physician (Pulmonary Disease) ?Doran Stabler, MD as Consulting Physician (Gastroenterology) ? ?Indicate any recent Medical Services you may have received from other than [facility] providers in the past year (date may be approximate). ? ?   ?Assessment:  ? This is a routine wellness examination for Robin Banks. ? ?Hearing/Vision screen ?No results found. ? ?Dietary issues and exercise activities discussed: ?Current Exercise Habits: Structured exercise class, Time (Minutes): 60, Frequency (Times/Week): 4, Weekly Exercise (Minutes/Week): 240 ? ? Goals Addressed   ?None ?  ?Depression Screen ?PHQ 2/9 Scores 06/15/2021 09/23/2020 07/31/2020 06/26/2020 05/13/2020 02/03/2020 02/03/2020  ?PHQ - 2 Score 0 2 1 0 1 0 0  ?PHQ- 9 Score - 6 2 0 - 0 -  ?  ?Fall Risk ?Fall Risk  06/15/2021 02/23/2021 05/13/2020 01/10/2020 12/05/2018  ?Falls in the past year? 0 0 0 0 0  ?Number falls in past yr: 0 0 0 0 -  ?Injury with  Fall? 0 0 0 0 -  ?Risk for fall due to : No Fall Risks - - - -  ?Follow up Falls evaluation completed Falls evaluation completed Falls prevention discussed - Falls evaluation completed;Education provided;Fa

## 2021-06-15 NOTE — Patient Instructions (Signed)

## 2021-07-06 ENCOUNTER — Encounter: Payer: Self-pay | Admitting: Family Medicine

## 2021-07-21 DIAGNOSIS — N3941 Urge incontinence: Secondary | ICD-10-CM | POA: Diagnosis not present

## 2021-07-21 DIAGNOSIS — R350 Frequency of micturition: Secondary | ICD-10-CM | POA: Diagnosis not present

## 2021-07-22 ENCOUNTER — Encounter: Payer: Self-pay | Admitting: Family Medicine

## 2021-07-22 ENCOUNTER — Ambulatory Visit (INDEPENDENT_AMBULATORY_CARE_PROVIDER_SITE_OTHER): Payer: Medicare HMO | Admitting: Family Medicine

## 2021-07-22 VITALS — BP 97/63 | HR 72 | Temp 97.7°F | Ht 61.0 in | Wt 126.0 lb

## 2021-07-22 DIAGNOSIS — Z6823 Body mass index (BMI) 23.0-23.9, adult: Secondary | ICD-10-CM | POA: Diagnosis not present

## 2021-07-22 DIAGNOSIS — R140 Abdominal distension (gaseous): Secondary | ICD-10-CM | POA: Diagnosis not present

## 2021-07-22 DIAGNOSIS — R7309 Other abnormal glucose: Secondary | ICD-10-CM

## 2021-07-22 LAB — BASIC METABOLIC PANEL
BUN: 18 mg/dL (ref 6–23)
CO2: 30 mEq/L (ref 19–32)
Calcium: 9.2 mg/dL (ref 8.4–10.5)
Chloride: 102 mEq/L (ref 96–112)
Creatinine, Ser: 0.79 mg/dL (ref 0.40–1.20)
GFR: 77.2 mL/min (ref >60.00)
Glucose, Bld: 93 mg/dL (ref 70–99)
Potassium: 3.9 mEq/L (ref 3.5–5.1)
Sodium: 137 mEq/L (ref 135–145)

## 2021-07-22 LAB — HEMOGLOBIN A1C: Hgb A1c MFr Bld: 5.8 % (ref 4.6–6.5)

## 2021-07-22 NOTE — Progress Notes (Signed)
? ? ? ?This visit occurred during the SARS-CoV-2 public health emergency.  Safety protocols were in place, including screening questions prior to the visit, additional usage of staff PPE, and extensive cleaning of exam room while observing appropriate contact time as indicated for disinfecting solutions.  ? ? ?Robin Banks , [birth year], 66 y.o., female ?MRN: [id number] ?Patient Care Team  ?  Relationship Specialty Notifications Start End  ?Ma Hillock, DO PCP - General Family Medicine  10/26/15   ?Arvella Nigh, MD Consulting Physician Obstetrics and Gynecology  10/28/15   ?Tanda Rockers, MD Consulting Physician Pulmonary Disease  10/28/15   ?Doran Stabler, MD Consulting Physician Gastroenterology  02/23/21   ? ? ?Chief Complaint  ?Patient presents with  ? Nutrition Counseling  ? ?  ?Subjective: Pt presents for an OV to discuss her weight and diet.  She does exercise routinely multiple times a week.  She has had A1c's in the prediabetic range that are concerning to her.  She had been able to decrease her A1c when she focused on a lower carbohydrate diet. Body mass index is 23.81 kg/m?Marland Kitchen  Patient reports she has increased bloating in the evening hours after she eats her meals.  She feels overall she eats well and with the exercise she is partaking and she feels she should be seen better results. ?  ? ?  07/22/2021  ?  8:30 AM 06/15/2021  ?  3:00 PM 09/23/2020  ?  3:09 PM 07/31/2020  ?  3:53 PM 06/26/2020  ?  3:56 PM  ?Depression screen PHQ 2/9  ?Decreased Interest 1 0 1 0 0  ?Down, Depressed, Hopeless 1 0 1 1 0  ?PHQ - 2 Score 2 0 2 1 0  ?Altered sleeping 0  3 0 0  ?Tired, decreased energy '1  1 1 '$ 0  ?Change in appetite 0  0 0 0  ?Feeling bad or failure about yourself  0  0 0 0  ?Trouble concentrating 0  0 0 0  ?Moving slowly or fidgety/restless 0  0 0 0  ?Suicidal thoughts 0  0 0 0  ?PHQ-9 Score '3  6 2 '$ 0  ? ? ?Allergies  ?Allergen Reactions  ? Demerol [Meperidine Hcl] Nausea And Vomiting  ? Nitrofurantoin Nausea Only   ?  swelling  ? ?Social History  ? ?Social History Narrative  ? Married to Donaldson). Three children [name] and [name].   ? 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   ? Takes herbal remedies, daily vitamin,   ? Wears seatbelt  ? Exercises 3x a week  ? Smoke detector in the home.   ? Firearms in the home (locked)  ? Feels safe in relationship.   ? ?Past Medical History:  ?Diagnosis Date  ? Allergy   ? Angio-edema   ? Asthma   ? Cataract   ? Chicken pox   ? Compression fracture of thoracic spine, non-traumatic (HCC)   ? T7-8  ? Depression   ? Excess ear wax   ? GERD (gastroesophageal reflux disease)   ? Hyperlipidemia   ? no meds taken  ? Insomnia   ? Menopause   ? Osteopenia   ? Overactive bladder   ? TMJ (temporomandibular joint syndrome)   ? Urinary incontinence   ? ?Past Surgical History:  ?Procedure Laterality Date  ? BLADDER SUSPENSION  2011  ? Dr. Matilde Sprang  ? CARPAL TUNNEL RELEASE Bilateral 2014  ? CATARACT EXTRACTION  12/2020  ? CATARACT EXTRACTION  01/2021  ? COLONOSCOPY    ? TUBAL LIGATION Bilateral 1978  ? ?Family History  ?Problem Relation Age of Onset  ? Hypertension Father   ? CAD Father   ? Hearing loss Father   ? Heart disease Father   ? COPD Father   ? Hyperlipidemia Mother   ? Arthritis Mother   ? Alcohol abuse Paternal Grandfather   ? Heart attack Paternal Grandfather   ? CAD Paternal Grandfather   ? Dementia Paternal Grandmother   ? Aortic aneurysm Maternal Grandmother   ? Cerebral palsy Son   ?     Mild CP by records  ? Kidney nephrosis Son   ?     FSG, kidney transplant.   ? Colon cancer Neg Hx   ? Esophageal cancer Neg Hx   ? Stomach cancer Neg Hx   ? Rectal cancer Neg Hx   ? Allergic rhinitis Neg Hx   ? Asthma Neg Hx   ? Eczema Neg Hx   ? Urticaria Neg Hx   ? ?Allergies as of 07/22/2021   ? ?   Reactions  ? Demerol [meperidine Hcl] Nausea And Vomiting  ? Nitrofurantoin Nausea Only  ? swelling  ? ?  ? ?  ?Medication List  ?  ? ?  ? Accurate as of July 22, 2021 11:01 AM. If you have any  questions, ask your nurse or doctor.  ?  ?  ? ?  ? ?STOP taking these medications   ? ?Red Yeast Rice 600 MG Caps ?Stopped by: Howard Pouch, DO ?  ? ?  ? ?TAKE these medications   ? ?albuterol 108 (90 Base) MCG/ACT inhaler ?Commonly known as: VENTOLIN HFA ?Inhale 1-2 puffs into the lungs every 6 (six) hours as needed for wheezing or shortness of breath. ?  ?BIOTIN PO ?Take by mouth. ?  ?CALCIUM PLUS VITAMIN D3 PO ?Take by mouth daily. ?  ?cetirizine 10 MG tablet ?Commonly known as: ZYRTEC ?Take 10 mg by mouth daily. ?  ?Cinnamon 500 MG Tabs ?Take by mouth. ?  ?fluticasone 50 MCG/ACT nasal spray ?Commonly known as: FLONASE ?Place into both nostrils. ?  ?Gemtesa 75 MG Tabs ?Generic drug: Vibegron ?Take by mouth. ?  ?MACA PO ?Take by mouth. ?  ?montelukast 10 MG tablet ?Commonly known as: SINGULAIR ?Take 1 tablet (10 mg total) by mouth at bedtime. ?  ?MULTIVITAMIN PO ?Take by mouth daily. ?  ?Prolensa 0.07 % Soln ?Generic drug: Bromfenac Sodium ?SMARTSIG:1 Drop(s) Left Eye Every Evening ?  ?Symbicort 80-4.5 MCG/ACT inhaler ?Generic drug: budesonide-formoterol ?Inhale 2 puffs into the lungs in the morning and at bedtime. Rinse mouth after each use ?  ?traZODone 150 MG tablet ?Commonly known as: DESYREL ?Take 1 tablet (150 mg total) by mouth at bedtime. ?  ? ?  ? ? ?All past medical history, surgical history, allergies, family history, immunizations andmedications were updated in the EMR today and reviewed under the history and medication portions of their EMR.    ? ?ROS ?Negative, with the exception of above mentioned in HPI ? ? ?Objective:  ?BP 97/63   Pulse 72   Temp 97.7 ?F (36.5 ?C) (Oral)   Ht '5\' 1"'$  (1.549 m)   Wt 126 lb (57.2 kg)   SpO2 100%   BMI 23.81 kg/m?  ?Body mass index is 23.81 kg/m?Marland Kitchen ?Physical Exam ?Vitals and nursing note reviewed.  ?Constitutional:   ?   General: She is not in acute distress. ?   Appearance: Normal appearance.  She is normal weight. She is not ill-appearing or toxic-appearing.   ?Eyes:  ?   Extraocular Movements: Extraocular movements intact.  ?   Conjunctiva/sclera: Conjunctivae normal.  ?   Pupils: Pupils are equal, round, and reactive to light.  ?Musculoskeletal:  ?   Right lower leg: No edema.  ?   Left lower leg: No edema.  ?Neurological:  ?   Mental Status: She is alert and oriented to person, place, and time. Mental status is at baseline.  ?Psychiatric:     ?   Mood and Affect: Mood normal.     ?   Behavior: Behavior normal.     ?   Thought Content: Thought content normal.     ?   Judgment: Judgment normal.  ? ? ?No results found. ?No results found. ?No results found for this or any previous visit (from the past 24 hour(s)). ? ?Assessment/Plan: ?Alexsa PINKIE MANGER is a 66 y.o. female present for OV for  ?Elevated hemoglobin A1c/BMI 23.0-23.9, adult/Bloating ?- discussed/ educated on low glycemic diet choices for carbohydrate intake. ?- Discussed insulin resistance and work-up.  We will start that today for her. ?- Her A1c's have been occasionally elevated in the prediabetic range, highest at 6.3.  However her fasting glucose have always been in normal range and on 1 occasion lower than normal at 59. ?- Hemoglobin G2X ?- Basic Metabolic Panel (BMET) ?- Cardio IQ Insulin Resistance Panel with Score ?- Will review FODMAP diet for bloating sx ?- Gas-X for bloating recommended ?- try avoidance of gluten and diary trial.  May find helpful with bloating symptoms. ?-Continue routine exercise of at least 150 minutes a week, which she artery partakes in. ?-Discussed strengthening core muscles.  Her BMI is normal at 23. ?-Follow-up dependent upon laboratory results. ? ?Reviewed expectations re: course of current medical issues. ?Discussed self-management of symptoms. ?Outlined signs and symptoms indicating need for more acute intervention. ?Patient verbalized understanding and all questions were answered. ?Patient received an After-Visit Summary. ? ? ? ?Orders Placed This Encounter  ?Procedures   ? Hemoglobin A1c  ? Basic Metabolic Panel (BMET)  ? Cardio IQ Insulin Resistance Panel with Score  ? ?No orders of the defined types were placed in this encounter. ? ?Referral Orders  ?No referral(s) requested today  ?

## 2021-07-22 NOTE — Patient Instructions (Addendum)
Great to see you today.  ?I have refilled the medication(s) we provide.  ? ?If labs were collected, we will inform you of lab results once received either by echart message or telephone call.  ? - echart message- for normal results that have been seen by the patient already.  ? - telephone call: abnormal results or if patient has not viewed results in their echart. ? ? ? ?We will call you lab results.  ? ? ?

## 2021-07-29 LAB — CARDIO IQ INSULIN RESISTANCE PANEL WITH SCORE
C-PEPTIDE, LC/MS/MS: 1.03 ng/mL (ref 0.68–2.16)
INSULIN, INTACT, LC/MS/MS: 7 u[IU]/mL (ref <=16)
Insulin Resistance Score: 12 (ref <=66)

## 2021-07-30 ENCOUNTER — Telehealth: Payer: Self-pay | Admitting: Family Medicine

## 2021-07-30 NOTE — Telephone Encounter (Signed)
Please call patient: Her insulin panel is normal.  She is not insulin resistant. ? ?I recommend she continue to follow the low glycemic index and be conscious of the FODMAP diet we discussed during her appointment. ?She can use Gas-X for the bloating with meals if needed. ?Continue the exercise and working on her core muscles. ?

## 2021-07-30 NOTE — Telephone Encounter (Signed)
Spoke with pt regarding labs and instructions.   

## 2021-08-10 DIAGNOSIS — M659 Unspecified synovitis and tenosynovitis, unspecified site: Secondary | ICD-10-CM | POA: Insufficient documentation

## 2021-08-22 DIAGNOSIS — N390 Urinary tract infection, site not specified: Secondary | ICD-10-CM | POA: Diagnosis not present

## 2021-08-27 DIAGNOSIS — N3941 Urge incontinence: Secondary | ICD-10-CM | POA: Diagnosis not present

## 2021-09-06 ENCOUNTER — Other Ambulatory Visit: Payer: Self-pay | Admitting: Family Medicine

## 2021-10-01 ENCOUNTER — Other Ambulatory Visit: Payer: Self-pay | Admitting: Family Medicine

## 2021-10-06 ENCOUNTER — Encounter: Payer: Self-pay | Admitting: Family Medicine

## 2021-10-06 ENCOUNTER — Ambulatory Visit (INDEPENDENT_AMBULATORY_CARE_PROVIDER_SITE_OTHER): Payer: Medicare HMO | Admitting: Family Medicine

## 2021-10-06 VITALS — BP 108/69 | HR 65 | Temp 97.3°F | Ht 61.0 in | Wt 127.0 lb

## 2021-10-06 DIAGNOSIS — R0982 Postnasal drip: Secondary | ICD-10-CM | POA: Diagnosis not present

## 2021-10-06 DIAGNOSIS — F331 Major depressive disorder, recurrent, moderate: Secondary | ICD-10-CM | POA: Diagnosis not present

## 2021-10-06 DIAGNOSIS — J4540 Moderate persistent asthma, uncomplicated: Secondary | ICD-10-CM | POA: Diagnosis not present

## 2021-10-06 DIAGNOSIS — M8580 Other specified disorders of bone density and structure, unspecified site: Secondary | ICD-10-CM | POA: Diagnosis not present

## 2021-10-06 DIAGNOSIS — F5101 Primary insomnia: Secondary | ICD-10-CM

## 2021-10-06 DIAGNOSIS — R69 Illness, unspecified: Secondary | ICD-10-CM | POA: Diagnosis not present

## 2021-10-06 MED ORDER — TRAZODONE HCL 150 MG PO TABS: 150.0000 mg | ORAL_TABLET | Freq: Every day | ORAL | 1 refills | Status: DC

## 2021-10-06 MED ORDER — IPRATROPIUM BROMIDE 0.06 % NA SOLN: 2.0000 | Freq: Four times a day (QID) | NASAL | 12 refills | Status: DC

## 2021-10-06 NOTE — Patient Instructions (Signed)
No follow-ups on file.        Great to see you today.  I have refilled the medication(s) we provide.   If labs were collected, we will inform you of lab results once received either by echart message or telephone call.   - echart message- for normal results that have been seen by the patient already.   - telephone call: abnormal results or if patient has not viewed results in their echart.  

## 2021-10-06 NOTE — Progress Notes (Signed)
Patient ID: Robin Banks, female  DOB: 1956/02/06, 66 y.o.   MRN: [id number] Patient Care Team    Relationship Specialty Notifications Start End  Ma Hillock, DO PCP - General Family Medicine  10/26/15   Arvella Nigh, MD Consulting Physician Obstetrics and Gynecology  10/28/15   Tanda Rockers, MD Consulting Physician Pulmonary Disease  10/28/15   Doran Stabler, MD Consulting Physician Gastroenterology  02/23/21     Chief Complaint  Patient presents with   Insomnia    Cmc; pt is not fasting    Subjective:  Robin Banks is a 66 y.o.  Female  present for [facility]. All past medical history, surgical history, allergies, family history, immunizations, medications and social history were updated in the electronic medical record today. All recent labs, ED visits and hospitalizations within the last year were reviewed.  Insomnia: Patient reported she is sleeping pretty well with the use of trazodone 150 mg nightly with MACA supplement.    Asthma/cough/PND:  She is now been seen with by allergy and asthma. They prescribe her symbicort and dymista (tried qvar and breo[too expensive]). She reports compliance with singulair. She uses zyrtec when needed.  She has noticed more postnasal drainage than she typically does for this time of the year.      10/06/2021    3:26 PM 07/22/2021    8:30 AM 06/15/2021    3:00 PM 09/23/2020    3:09 PM 07/31/2020    3:53 PM  Depression screen PHQ 2/9  Decreased Interest 0 1 0 1 0  Down, Depressed, Hopeless 1 1 0 1 1  PHQ - 2 Score 1 2 0 2 1  Altered sleeping 0 0  3 0  Tired, decreased energy '1 1  1 1  '$ Change in appetite 0 0  0 0  Feeling bad or failure about yourself  0 0  0 0  Trouble concentrating 0 0  0 0  Moving slowly or fidgety/restless 0 0  0 0  Suicidal thoughts 0 0  0 0  PHQ-9 Score '2 3  6 2      '$ 10/06/2021    3:26 PM 07/22/2021    8:31 AM 09/23/2020    3:10 PM 07/31/2020    3:53 PM  GAD 7 : Generalized Anxiety Score  Nervous,  Anxious, on Edge 0 0 1 1  Control/stop worrying 0 0 0 0  Worry too much - different things 0 0 0 0  Trouble relaxing 1 0 0 0  Restless 0 0 0 0  Easily annoyed or irritable 0 0 0 0  Afraid - awful might happen 0 0 0 0  Total GAD 7 Score 1 0 1 1   Immunization History  Administered Date(s) Administered   Fluad Quad(high Dose 65+) 12/05/2018, 01/10/2020   Influenza,inj,Quad PF,6+ Mos 03/24/2016, 01/24/2017, 12/04/2017   Influenza-Unspecified 12/26/2013, 01/24/2017, 12/26/2020   PFIZER(Purple Top)SARS-COV-2 Vaccination 06/02/2019, 07/02/2019, 01/28/2020   Pneumococcal Conjugate-13 02/03/2020   Pneumococcal Polysaccharide-23 11/20/2012, 02/23/2021   Td 04/29/2020   Tdap 02/03/2009, 04/30/2020   Zoster Recombinat (Shingrix) 03/09/2020, 06/15/2020   Zoster, Live 03/15/2013   Past Medical History:  Diagnosis Date   Allergy    Angio-edema    Asthma    Cataract    Chicken pox    Compression fracture of thoracic spine, non-traumatic (HCC)    T7-8   Depression    Excess ear wax    GERD (gastroesophageal reflux disease)  Hyperlipidemia    no meds taken   Insomnia    Menopause    Osteopenia    Overactive bladder    TMJ (temporomandibular joint syndrome)    Urinary incontinence    Allergies  Allergen Reactions   Demerol [Meperidine Hcl] Nausea And Vomiting   Nitrofurantoin Nausea Only    swelling   Past Surgical History:  Procedure Laterality Date   BLADDER SUSPENSION  2011   Dr. Matilde Sprang   CARPAL TUNNEL RELEASE Bilateral 2014   CATARACT EXTRACTION  12/2020   CATARACT EXTRACTION  01/2021   COLONOSCOPY     TUBAL LIGATION Bilateral 1978   Family History  Problem Relation Age of Onset   Hypertension Father    CAD Father    Hearing loss Father    Heart disease Father    COPD Father    Hyperlipidemia Mother    Arthritis Mother    Alcohol abuse Paternal Grandfather    Heart attack Paternal Grandfather    CAD Paternal Grandfather    Dementia Paternal Grandmother     Aortic aneurysm Maternal Grandmother    Cerebral palsy Son        Mild CP by records   Kidney nephrosis Son        FSG, kidney transplant.    Colon cancer Neg Hx    Esophageal cancer Neg Hx    Stomach cancer Neg Hx    Rectal cancer Neg Hx    Allergic rhinitis Neg Hx    Asthma Neg Hx    Eczema Neg Hx    Urticaria Neg Hx    Social History   Social History Narrative   Married to [place]). Three children [name] and [name].    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    Takes herbal remedies, daily vitamin,    Wears seatbelt   Exercises 3x a week   Smoke detector in the home.    Firearms in the home (locked)   Feels safe in relationship.     Allergies as of 10/06/2021       Reactions   Demerol [meperidine Hcl] Nausea And Vomiting   Nitrofurantoin Nausea Only   swelling        Medication List        Accurate as of October 06, 2021  5:56 PM. If you have any questions, ask your nurse or doctor.          STOP taking these medications    Cinnamon 500 MG Tabs Stopped by: Howard Pouch, DO   Gemtesa 75 MG Tabs Generic drug: Vibegron Stopped by: Howard Pouch, DO       TAKE these medications    albuterol 108 (90 Base) MCG/ACT inhaler Commonly known as: VENTOLIN HFA Inhale 1-2 puffs into the lungs every 6 (six) hours as needed for wheezing or shortness of breath.   BIOTIN PO Take by mouth.   CALCIUM PLUS VITAMIN D3 PO Take by mouth daily.   cetirizine 10 MG tablet Commonly known as: ZYRTEC Take 10 mg by mouth daily.   fluticasone 50 MCG/ACT nasal spray Commonly known as: FLONASE Place into both nostrils.   ipratropium 0.06 % nasal spray Commonly known as: ATROVENT Place 2 sprays into both nostrils 4 (four) times daily. Started by: Howard Pouch, DO   MACA PO Take by mouth.   montelukast 10 MG tablet Commonly known as: SINGULAIR Take 1 tablet (10 mg total) by mouth at bedtime.   MULTIVITAMIN PO Take by mouth daily.  Prolensa 0.07 %  Soln Generic drug: Bromfenac Sodium SMARTSIG:1 Drop(s) Left Eye Every Evening   Symbicort 80-4.5 MCG/ACT inhaler Generic drug: budesonide-formoterol Inhale 2 puffs into the lungs in the morning and at bedtime. Rinse mouth after each use   traZODone 150 MG tablet Commonly known as: DESYREL Take 1 tablet (150 mg total) by mouth at bedtime.        All past medical history, surgical history, allergies, family history, immunizations andmedications were updated in the EMR today and reviewed under the history and medication portions of their EMR.         ROS: 14 pt review of systems performed and negative (unless mentioned in an HPI)  Objective: BP 108/69   Pulse 65   Temp (!) 97.3 F (36.3 C) (Oral)   Ht '5\' 1"'$  (1.549 m)   Wt 127 lb (57.6 kg)   SpO2 99%   BMI 24.00 kg/m  Physical Exam Vitals and nursing note reviewed.  Constitutional:      General: She is not in acute distress.    Appearance: Normal appearance. She is normal weight. She is not ill-appearing or toxic-appearing.  HENT:     Head: Normocephalic and atraumatic.     Nose: Rhinorrhea present. No congestion.     Mouth/Throat:     Mouth: Mucous membranes are moist.     Pharynx: No oropharyngeal exudate.     Comments: Postnasal drip present.  Mild irritation of throat present. Eyes:     Extraocular Movements: Extraocular movements intact.     Conjunctiva/sclera: Conjunctivae normal.     Pupils: Pupils are equal, round, and reactive to light.  Cardiovascular:     Rate and Rhythm: Normal rate and regular rhythm.  Pulmonary:     Effort: Pulmonary effort is normal.     Breath sounds: Normal breath sounds. No wheezing, rhonchi or rales.  Musculoskeletal:     Right lower leg: No edema.     Left lower leg: No edema.  Neurological:     Mental Status: She is alert and oriented to person, place, and time. Mental status is at baseline.  Psychiatric:        Mood and Affect: Mood normal.        Behavior: Behavior  normal.        Thought Content: Thought content normal.        Judgment: Judgment normal.       No results found.  Assessment/plan: JORDI KAMM is a 66 y.o. female present for CPE/CMC Insomnia, unspecified type/Moderate recurrent major depression (HCC) Stable Continue trazodone 150 mg nightly. -Patient would like to avoid any of the controlled substances including hypnotics. -Could consider gabapentin  -Tried: Trazodone, amitriptyline, Remeron - Follow-up every 5.5 months on current issues, sooner if still not sleeping well.  Could consider a sleep specialist to rule out other sleep disorder.  Moderate persistent asthma without complication/allergic rhinitis/seasomal allergies/PND -She is experiencing postnasal drip.  Discussed use of Atrovent nasal spray and she is agreeable to try this today.  She is to continue her normal allergy regimen as well. -Continue inhalers prescribed by [facility] -Continue Singulair -Continue OTC oral antihistamine PRN   Disorder of bone/osteopenia Vitamin D levels due next visit. She supplements with 1500 mg calcium with vit d (unknown total dose) Dexa completed at gyn [facility]  Return for CPE is scheduled already..    No orders of the defined types were placed in this encounter.  Meds ordered this encounter  Medications  traZODone (DESYREL) 150 MG tablet    Sig: Take 1 tablet (150 mg total) by mouth at bedtime.    Dispense:  90 tablet    Refill:  1   ipratropium (ATROVENT) 0.06 % nasal spray    Sig: Place 2 sprays into both nostrils 4 (four) times daily.    Dispense:  15 mL    Refill:  12    Referral Orders  No referral(s) requested today     Electronically signed by: Howard Pouch, [place]

## 2021-10-18 DIAGNOSIS — R350 Frequency of micturition: Secondary | ICD-10-CM | POA: Diagnosis not present

## 2021-10-29 DIAGNOSIS — N3941 Urge incontinence: Secondary | ICD-10-CM | POA: Diagnosis not present

## 2021-11-02 ENCOUNTER — Other Ambulatory Visit: Payer: Self-pay | Admitting: Allergy

## 2021-11-12 DIAGNOSIS — R350 Frequency of micturition: Secondary | ICD-10-CM | POA: Diagnosis not present

## 2021-11-12 DIAGNOSIS — N3941 Urge incontinence: Secondary | ICD-10-CM | POA: Diagnosis not present

## 2021-11-12 DIAGNOSIS — N3946 Mixed incontinence: Secondary | ICD-10-CM | POA: Diagnosis not present

## 2021-12-16 ENCOUNTER — Other Ambulatory Visit: Payer: Self-pay | Admitting: Allergy

## 2021-12-29 DIAGNOSIS — N3946 Mixed incontinence: Secondary | ICD-10-CM | POA: Diagnosis not present

## 2021-12-29 DIAGNOSIS — R350 Frequency of micturition: Secondary | ICD-10-CM | POA: Diagnosis not present

## 2022-01-01 ENCOUNTER — Other Ambulatory Visit: Payer: Self-pay | Admitting: Allergy

## 2022-01-04 ENCOUNTER — Other Ambulatory Visit: Payer: Self-pay | Admitting: Allergy

## 2022-01-09 ENCOUNTER — Other Ambulatory Visit: Payer: Self-pay | Admitting: Allergy

## 2022-01-24 DIAGNOSIS — H47323 Drusen of optic disc, bilateral: Secondary | ICD-10-CM | POA: Diagnosis not present

## 2022-01-24 DIAGNOSIS — H524 Presbyopia: Secondary | ICD-10-CM | POA: Diagnosis not present

## 2022-01-25 ENCOUNTER — Ambulatory Visit: Payer: Medicare HMO | Admitting: Allergy

## 2022-01-25 ENCOUNTER — Encounter: Payer: Self-pay | Admitting: Allergy

## 2022-01-25 VITALS — BP 116/76 | HR 77 | Temp 98.2°F | Resp 16 | Ht 61.0 in | Wt 124.5 lb

## 2022-01-25 DIAGNOSIS — J4540 Moderate persistent asthma, uncomplicated: Secondary | ICD-10-CM | POA: Diagnosis not present

## 2022-01-25 DIAGNOSIS — J3089 Other allergic rhinitis: Secondary | ICD-10-CM

## 2022-01-25 DIAGNOSIS — R490 Dysphonia: Secondary | ICD-10-CM

## 2022-01-25 DIAGNOSIS — J302 Other seasonal allergic rhinitis: Secondary | ICD-10-CM

## 2022-01-25 HISTORY — DX: Dysphonia: R49.0

## 2022-01-25 MED ORDER — MONTELUKAST SODIUM 10 MG PO TABS: 10.0000 mg | ORAL_TABLET | Freq: Every day | ORAL | 2 refills | Status: DC

## 2022-01-25 MED ORDER — RYALTRIS 665-25 MCG/ACT NA SUSP: 1.0000 | Freq: Two times a day (BID) | NASAL | 5 refills | Status: DC

## 2022-01-25 MED ORDER — ALBUTEROL SULFATE HFA 108 (90 BASE) MCG/ACT IN AERS: 2.0000 | INHALATION_SPRAY | RESPIRATORY_TRACT | 1 refills | Status: DC | PRN

## 2022-01-25 MED ORDER — BUDESONIDE-FORMOTEROL FUMARATE 80-4.5 MCG/ACT IN AERO: 2.0000 | INHALATION_SPRAY | Freq: Two times a day (BID) | RESPIRATORY_TRACT | 5 refills | Status: DC

## 2022-01-25 NOTE — Assessment & Plan Note (Signed)
Past history - Diagnosed with asthma over 20 years ago and was using Qvar 40 1-2 puffs QAM and albuterol with good benefit. However she gets flares and prednisone usually helps. History of GERD but not on any medications. 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 Chest x-ray showed no acute process. Breo not covered. Interim history - was doing well up until a few months ago. No prednisone this year.   Today's spirometry showed some restriction. ? Use Symbicort 2 puffs twice a day for the next 1-2 weeks until you feel better.  ? Daily controller medication(s): continue with Symbicort 69mg 2 puffs once a day with spacer and rinse mouth afterwards. ? Restart Singulair (montelukast) '10mg'$  daily at night. ? During upper respiratory infections/asthma flares: start Symbicort 849m 2 puffs twice a day for 1-2 weeks until your breathing symptoms return to baseline.  ? May use albuterol rescue inhaler 2 puffs or nebulizer every 4 to 6 hours as needed for shortness of breath, chest tightness, coughing, and wheezing. May use albuterol rescue inhaler 2 puffs 5 to 15 minutes prior to strenuous physical activities. Monitor frequency of use.

## 2022-01-25 NOTE — Assessment & Plan Note (Signed)
Past history - 2011 skin testing was positive to tree pollen, dust mites and one mold. Dymista too expensive Interim history - PND, tried azelastine and Atrovent with no relief.  Continue environmental control measures.   Continue Singulair (montelukast) '10mg'$  daily.  Start Ryaltris (olopatadine + mometasone nasal spray combination) 1-2 sprays per nostril twice a day. Sample given.  This replaces your other nasal sprays.  If this works well for you, then have [facility] ship the medication to your home - prescription already sent in.   Use over the counter antihistamines such as Zyrtec (cetirizine), Claritin (loratadine), Allegra (fexofenadine), or Xyzal (levocetirizine) daily as needed. May take twice a day during allergy flares. May switch antihistamines every few months.

## 2022-01-25 NOTE — Assessment & Plan Note (Signed)
Persistent for a few years which get worse at the end of the day.  Patient wants to try above nasal spray first.  If no improvement will refer to ENT - Dr. Rowe Clack at [facility] who specialized in voice disorders.

## 2022-01-25 NOTE — Patient Instructions (Addendum)
Asthma:  Use Symbicort 2 puffs twice a day for the next 1-2 weeks until you feel better.  Daily controller medication(s): continue with Symbicort 35mg 2 puffs once a day with spacer and rinse mouth afterwards. Restart Singulair (montelukast) '10mg'$  daily at night. During upper respiratory infections/asthma flares: start Symbicort 839m 2 puffs twice a day for 1-2 weeks until your breathing symptoms return to baseline.  May use albuterol rescue inhaler 2 puffs or nebulizer every 4 to 6 hours as needed for shortness of breath, chest tightness, coughing, and wheezing. May use albuterol rescue inhaler 2 puffs 5 to 15 minutes prior to strenuous physical activities. Monitor frequency of use.  Asthma control goals:  Full participation in all desired activities (may need albuterol before activity) Albuterol use two times or less a week on average (not counting use with activity) Cough interfering with sleep two times or less a month Oral steroids no more than once a year No hospitalizations  Allergic rhinitis: 2021 allergy testing was positive to tree pollen, dust mites and one mold.  Continue environmental control measures.  Continue Singulair (montelukast) '10mg'$  daily. Start Ryaltris (olopatadine + mometasone nasal spray combination) 1-2 sprays per nostril twice a day. Sample given. This replaces your other nasal sprays. If this works well for you, then have [facility] ship the medication to your home - prescription already sent in.  Use over the counter antihistamines such as Zyrtec (cetirizine), Claritin (loratadine), Allegra (fexofenadine), or Xyzal (levocetirizine) daily as needed. May take twice a day during allergy flares. May switch antihistamines every few months. If you still have issues with your voice - let me know and will refer to you ENT next. [facility].   Follow up in 6 months or sooner if needed.   Reducing Pollen Exposure Pollen  seasons: trees (spring), grass (summer) and ragweed/weeds (fall). Keep windows closed in your home and car to lower pollen exposure.  Install air conditioning in the bedroom and throughout the house if possible.  Avoid going out in dry windy days - especially early morning. Pollen counts are highest between 5 - 10 AM and on dry, hot and windy days.  Save outside activities for late afternoon or after a heavy rain, when pollen levels are lower.  Avoid mowing of grass if you have grass pollen allergy. Be aware that pollen can also be transported indoors on people and pets.  Dry your clothes in an automatic dryer rather than hanging them outside where they might collect pollen.  Rinse hair and eyes before bedtime.  Control of House Dust Mite Allergen Dust mite allergens are a common trigger of allergy and asthma symptoms. While they can be found throughout the house, these microscopic creatures thrive in warm, humid environments such as bedding, upholstered furniture and carpeting. Because so much time is spent in the bedroom, it is essential to reduce mite levels there.  Encase pillows, mattresses, and box springs in special allergen-proof fabric covers or airtight, zippered plastic covers.  Bedding should be washed weekly in hot water (130 F) and dried in a hot dryer. Allergen-proof covers are available for comforters and pillows that can't be regularly washed.  Wash the allergy-proof covers every few months. Minimize clutter in the bedroom. Keep pets out of the bedroom.  Keep humidity less than 50% by using a dehumidifier or air conditioning. You can buy a humidity measuring device called a hygrometer to monitor this.  If possible, replace carpets  with hardwood, linoleum, or washable area rugs. If that's not possible, vacuum frequently with a vacuum that has a HEPA filter. Remove all upholstered furniture and non-washable window drapes from the bedroom. Remove all non-washable stuffed toys from  the bedroom.  Wash stuffed toys weekly.  Mold Control Mold and fungi can grow on a variety of surfaces provided certain temperature and moisture conditions exist.  Outdoor molds grow on plants, decaying vegetation and soil. The major outdoor mold, Alternaria and Cladosporium, are found in very high numbers during hot and dry conditions. Generally, a late summer - fall peak is seen for common outdoor fungal spores. Rain will temporarily lower outdoor mold spore count, but counts rise rapidly when the rainy period ends. The most important indoor molds are Aspergillus and Penicillium. Dark, humid and poorly ventilated basements are ideal sites for mold growth. The next most common sites of mold growth are the bathroom and the kitchen. Outdoor (Seasonal) Mold Control Use air conditioning and keep windows closed. Avoid exposure to decaying vegetation. Avoid leaf raking. Avoid grain handling. Consider wearing a face mask if working in moldy areas.  Indoor (Perennial) Mold Control  Maintain humidity below 50%. Get rid of mold growth on hard surfaces with water, detergent and, if necessary, 5% bleach (do not mix with other cleaners). Then dry the area completely. If mold covers an area more than 10 square feet, consider hiring an indoor environmental professional. For clothing, washing with soap and water is best. If moldy items cannot be cleaned and dried, throw them away. Remove sources e.g. contaminated carpets. Repair and seal leaking roofs or pipes. Using dehumidifiers in damp basements may be helpful, but empty the water and clean units regularly to prevent mildew from forming. All rooms, especially basements, bathrooms and kitchens, require ventilation and cleaning to deter mold and mildew growth. Avoid carpeting on concrete or damp floors, and storing items in damp areas.

## 2022-01-25 NOTE — Progress Notes (Signed)
Follow Up Note  RE: JESSEL GETTINGER MRN: [id number] DOB: 12/16/1955 Date of Office Visit: 01/25/2022  Referring provider: Ma Hillock, DO Primary care provider: Ma Hillock, DO  Chief Complaint: Cough (Cough for at least 2/3 months. Gets winded when she goes upstairs. ) and Allergic Rhinitis  (Raspy voice,drainage, cough. )  History of Present Illness: I had the pleasure of seeing Robin Banks for a follow up visit at the [facility] [place] [facility] [place] on 01/25/2022. She is a 66 y.o. female, who is being followed for asthma, allergic rhinitis. Her previous allergy office visit was on 10/22/2020 with Dr. Maudie Banks. Today is a regular follow up visit.  Asthma Currently on Symbicort 65mg 2 puffs once a day and twice a day if needed. Patient ran out of Singulair about 3 weeks ago. Not sure if it worsened symptoms or not.   Denies any ER/urgent care visits or prednisone use since the last visit.   Seasonal and perennial allergic rhinitis Noted some coughing with some phlegm, PND drainage for the past 2-3 months. No fevers/chills.  Taking zyrtec daily, Flonase 1 spray per nostril once a day and Atrovent prn. No nosebleeds.   Voice change at the end of the day after talking a lot at work - this has been going on for quite some time now. No prior ENT evaluation.   Assessment and Plan: DCrestais a 66y.o. female with: Asthma Past history - Diagnosed with asthma over 20 years ago and was using Qvar 40 1-2 puffs QAM and albuterol with good benefit. However she gets flares and prednisone usually helps. History of GERD but not on any medications. 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 Chest x-ray showed no acute process. Breo not covered. Interim history - was doing well up until a few months ago. No prednisone this year.  Today's spirometry showed some restriction. Use Symbicort 2 puffs twice a day for  the next 1-2 weeks until you feel better.  Daily controller medication(s): continue with Symbicort 863m 2 puffs once a day with spacer and rinse mouth afterwards. Restart Singulair (montelukast) '10mg'$  daily at night. During upper respiratory infections/asthma flares: start Symbicort 8052m2 puffs twice a day for 1-2 weeks until your breathing symptoms return to baseline.  May use albuterol rescue inhaler 2 puffs or nebulizer every 4 to 6 hours as needed for shortness of breath, chest tightness, coughing, and wheezing. May use albuterol rescue inhaler 2 puffs 5 to 15 minutes prior to strenuous physical activities. Monitor frequency of use.   Seasonal and perennial allergic rhinitis Past history - 2011 skin testing was positive to tree pollen, dust mites and one mold. Dymista too expensive Interim history - PND, tried azelastine and Atrovent with no relief. Continue environmental control measures.  Continue Singulair (montelukast) '10mg'$  daily. Start Ryaltris (olopatadine + mometasone nasal spray combination) 1-2 sprays per nostril twice a day. Sample given. This replaces your other nasal sprays. If this works well for you, then have [facility] ship the medication to your home - prescription already sent in.  Use over the counter antihistamines such as Zyrtec (cetirizine), Claritin (loratadine), Allegra (fexofenadine), or Xyzal (levocetirizine) daily as needed. May take twice a day during allergy flares. May switch antihistamines every few months.  Voice hoarseness Persistent for a few years which get worse at the end of the day. Patient wants to try above nasal spray first. If no improvement will  refer to ENT - Dr. Rowe Banks at [facility] who specialized in voice disorders.  Return in about 6 months (around 07/26/2022).  Meds ordered this encounter  Medications   budesonide-formoterol (SYMBICORT) 80-4.5 MCG/ACT inhaler    Sig: Inhale 2 puffs into the lungs in the morning and at bedtime. with spacer and  rinse mouth afterwards.    Dispense:  1 each    Refill:  5   montelukast (SINGULAIR) 10 MG tablet    Sig: Take 1 tablet (10 mg total) by mouth at bedtime.    Dispense:  90 tablet    Refill:  2   albuterol (VENTOLIN HFA) 108 (90 Base) MCG/ACT inhaler    Sig: Inhale 2 puffs into the lungs every 4 (four) hours as needed for wheezing or shortness of breath (coughing fits).    Dispense:  18 g    Refill:  1   Olopatadine-Mometasone (RYALTRIS) 665-25 MCG/ACT SUSP    Sig: Place 1-2 sprays into the nose in the morning and at bedtime.    Dispense:  29 g    Refill:  5    [phone]   Lab Orders  No laboratory test(s) ordered today    Diagnostics: Spirometry:  Tracings reviewed. Her effort: Good reproducible efforts. FVC: 1.97L FEV1: 1.50L, 56% predicted FEV1/FVC ratio: 76% Interpretation: Spirometry consistent with possible restrictive disease.  Please see scanned spirometry results for details.  Medication List:  Current Outpatient Medications  Medication Sig Dispense Refill   albuterol (VENTOLIN HFA) 108 (90 Base) MCG/ACT inhaler Inhale 2 puffs into the lungs every 4 (four) hours as needed for wheezing or shortness of breath (coughing fits). 18 g 1   BIOTIN PO Take by mouth.     budesonide-formoterol (SYMBICORT) 80-4.5 MCG/ACT inhaler Inhale 2 puffs into the lungs in the morning and at bedtime. with spacer and rinse mouth afterwards. 1 each 5   Calcium Carb-Cholecalciferol (CALCIUM PLUS VITAMIN D3 PO) Take by mouth daily.     cetirizine (ZYRTEC) 10 MG tablet Take 10 mg by mouth daily.     montelukast (SINGULAIR) 10 MG tablet Take 1 tablet (10 mg total) by mouth at bedtime. 90 tablet 2   Multiple Vitamin (MULTIVITAMIN PO) Take by mouth daily.     Olopatadine-Mometasone (RYALTRIS) [id number] MCG/ACT SUSP Place 1-2 sprays into the nose in the morning and at bedtime. 29 g 5   traZODone (DESYREL) 150 MG tablet Take 1 tablet (150 mg total) by mouth at bedtime. 90 tablet 1   Vibegron (GEMTESA  PO) Take by mouth.     Maca Root (MACA PO) Take by mouth. (Patient not taking: Reported on 01/25/2022)     PROLENSA 0.07 % SOLN SMARTSIG:1 Drop(s) Left Eye Every Evening (Patient not taking: Reported on 01/25/2022)     No current facility-administered medications for this visit.   Allergies: Allergies  Allergen Reactions   Demerol [Meperidine Hcl] Nausea And Vomiting   Nitrofurantoin Nausea Only    swelling   I reviewed her past medical history, social history, family history, and environmental history and no significant changes have been reported from her previous visit.  Review of Systems  Constitutional:  Negative for appetite change, chills, fever and unexpected weight change.  HENT:  Positive for postnasal drip, rhinorrhea and voice change. Negative for congestion.   Eyes:  Negative for itching.  Respiratory:  Positive for cough. Negative for chest tightness, shortness of breath and wheezing.   Cardiovascular:  Negative for chest pain.  Gastrointestinal:  Negative for abdominal pain.  Genitourinary:  Negative for difficulty urinating.  Skin:  Negative for rash.  Allergic/Immunologic: Positive for environmental allergies. Negative for food allergies.    Objective: BP 116/76   Pulse 77   Temp 98.2 F (36.8 C)   Resp 16   Ht '5\' 1"'$  (1.549 m)   Wt 124 lb 8 oz (56.5 kg)   SpO2 96%   BMI 23.52 kg/m  Body mass index is 23.52 kg/m. Physical Exam Vitals and nursing note reviewed.  Constitutional:      Appearance: Normal appearance. She is well-developed and normal weight.  HENT:     Head: Normocephalic and atraumatic.     Right Ear: External ear normal.     Left Ear: External ear normal.     Ears:     Comments: Cerumen b/l.    Nose: Nose normal. No congestion or rhinorrhea.     Mouth/Throat:     Mouth: Mucous membranes are moist.     Pharynx: Oropharynx is clear.  Eyes:     Conjunctiva/sclera: Conjunctivae normal.  Cardiovascular:     Rate and Rhythm: Normal rate  and regular rhythm.     Heart sounds: Normal heart sounds. No murmur heard.    No friction rub. No gallop.  Pulmonary:     Effort: Pulmonary effort is normal.     Breath sounds: Normal breath sounds. No wheezing or rales.  Musculoskeletal:     Cervical back: Neck supple.  Skin:    General: Skin is warm.     Findings: No rash.  Neurological:     Mental Status: She is alert and oriented to person, place, and time.  Psychiatric:        Mood and Affect: Mood normal.        Behavior: Behavior normal.    Previous notes and tests were reviewed. The plan was reviewed with the patient/family, and all questions/concerned were addressed.  It was my pleasure to see Raechal today and participate in her care. Please feel free to contact me with any questions or concerns.  Sincerely,  Rexene Alberts, DO Allergy & Immunology  [facility] office: [place] office: [phone]

## 2022-01-26 DIAGNOSIS — R350 Frequency of micturition: Secondary | ICD-10-CM | POA: Diagnosis not present

## 2022-01-26 DIAGNOSIS — N3941 Urge incontinence: Secondary | ICD-10-CM | POA: Diagnosis not present

## 2022-02-10 ENCOUNTER — Telehealth: Payer: Self-pay | Admitting: Allergy

## 2022-02-10 NOTE — Telephone Encounter (Signed)
Left Vmail to call us back regarding this message that was forwarded to me by the front admin.  "The symptoms that I had when last in your office are no better. The cough is worse. Will you prescribe the prednisone pleas?   "

## 2022-02-25 ENCOUNTER — Encounter: Payer: Medicare HMO | Admitting: Family Medicine

## 2022-02-26 DIAGNOSIS — N3000 Acute cystitis without hematuria: Secondary | ICD-10-CM | POA: Diagnosis not present

## 2022-03-29 ENCOUNTER — Encounter: Payer: Medicare HMO | Admitting: Family Medicine

## 2022-04-01 ENCOUNTER — Other Ambulatory Visit: Payer: Self-pay | Admitting: Family Medicine

## 2022-04-11 NOTE — Telephone Encounter (Signed)
Please call patient.  She needs to be evaluated for this new cough.  Prefer in office visit.  Thank you.

## 2022-04-11 NOTE — Telephone Encounter (Signed)
What do you recommend, Dr. Maudie Mercury?

## 2022-04-11 NOTE — Telephone Encounter (Signed)
Patient called and said she has a persistent cough with mucus with color. She said Dr. Maudie Mercury prescribed prednisone last time and it helped, but now the cough is back. She was wondering if she could get some more prednisone. Kristopher Oppenheim on [address].

## 2022-04-12 NOTE — Progress Notes (Deleted)
Follow Up Note  RE: Robin Banks MRN: [id number] DOB: [birth year] Date of Office Visit: 04/13/2022  Referring provider: Ma Hillock, DO Primary care provider: Ma Hillock, DO  Chief Complaint: No chief complaint on file.  History of Present Illness: I had the pleasure of seeing Robin Banks for a follow up visit at the [facility] [place] [facility] [place] on 04/12/2022. She is a 67 y.o. female, who is being followed for asthma, allergic rhinitis and voice hoarseness. Her previous allergy office visit was on 01/25/2022 with Dr. Maudie Mercury. Today is a new complaint visit of coughing .  sthma Past history - Diagnosed with asthma over 20 years ago and was using Qvar 40 1-2 puffs QAM and albuterol with good benefit. However she gets flares and prednisone usually helps. History of GERD but not on any medications. 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 Chest x-ray showed no acute process. Breo not covered. Interim history - was doing well up until a few months ago. No prednisone this year.  Today's spirometry showed some restriction. Use Symbicort 2 puffs twice a day for the next 1-2 weeks until you feel better.  Daily controller medication(s): continue with Symbicort 80mg 2 puffs once a day with spacer and rinse mouth afterwards. Restart Singulair (montelukast) 153mdaily at night. During upper respiratory infections/asthma flares: start Symbicort 8014m2 puffs twice a day for 1-2 weeks until your breathing symptoms return to baseline.  May use albuterol rescue inhaler 2 puffs or nebulizer every 4 to 6 hours as needed for shortness of breath, chest tightness, coughing, and wheezing. May use albuterol rescue inhaler 2 puffs 5 to 15 minutes prior to strenuous physical activities. Monitor frequency of use.    Seasonal and perennial allergic rhinitis Past history - 2011 skin testing was positive to tree pollen,  dust mites and one mold. Dymista too expensive Interim history - PND, tried azelastine and Atrovent with no relief. Continue environmental control measures.  Continue Singulair (montelukast) 26m60mily. Start Ryaltris (olopatadine + mometasone nasal spray combination) 1-2 sprays per nostril twice a day. Sample given. This replaces your other nasal sprays. If this works well for you, then have [facility] ship the medication to your home - prescription already sent in.  Use over the counter antihistamines such as Zyrtec (cetirizine), Claritin (loratadine), Allegra (fexofenadine), or Xyzal (levocetirizine) daily as needed. May take twice a day during allergy flares. May switch antihistamines every few months.   Voice hoarseness Persistent for a few years which get worse at the end of the day. Patient wants to try above nasal spray first. If no improvement will [facility].   Return in about 6 months (around 07/26/2022).  Assessment and Plan: Robin Banks 67 y87. female with: No problem-specific Assessment & Plan notes found for this encounter.  No follow-ups on file.  No orders of the defined types were placed in this encounter.  Lab Orders  No laboratory test(s) ordered today    Diagnostics: Spirometry:  Tracings reviewed. Her effort: {Blank single:19197::"Good reproducible efforts.","It was hard to get consistent efforts and there is a question as to whether this reflects a maximal maneuver.","Poor effort, data can not be interpreted."} FVC: ***L FEV1: ***L, ***% predicted FEV1/FVC ratio: ***% Interpretation: {Blank single:19197::"Spirometry consistent with mild obstructive disease","Spirometry consistent with moderate obstructive disease","Spirometry consistent with severe obstructive disease","Spirometry consistent with possible restrictive  disease","Spirometry consistent with mixed obstructive and restrictive disease","Spirometry  uninterpretable due to technique","Spirometry consistent with normal pattern","No overt abnormalities noted given today's efforts"}.  Please see scanned spirometry results for details.  Skin Testing: {Blank single:19197::"Select foods","Environmental allergy panel","Environmental allergy panel and select foods","Food allergy panel","None","Deferred due to recent antihistamines use"}. *** Results discussed with patient/family.   Medication List:  Current Outpatient Medications  Medication Sig Dispense Refill   albuterol (VENTOLIN HFA) 108 (90 Base) MCG/ACT inhaler Inhale 2 puffs into the lungs every 4 (four) hours as needed for wheezing or shortness of breath (coughing fits). 18 g 1   BIOTIN PO Take by mouth.     budesonide-formoterol (SYMBICORT) 80-4.5 MCG/ACT inhaler Inhale 2 puffs into the lungs in the morning and at bedtime. with spacer and rinse mouth afterwards. 1 each 5   Calcium Carb-Cholecalciferol (CALCIUM PLUS VITAMIN D3 PO) Take by mouth daily.     cetirizine (ZYRTEC) 10 MG tablet Take 10 mg by mouth daily.     Maca Root (MACA PO) Take by mouth. (Patient not taking: Reported on 01/25/2022)     montelukast (SINGULAIR) 10 MG tablet Take 1 tablet (10 mg total) by mouth at bedtime. 90 tablet 2   Multiple Vitamin (MULTIVITAMIN PO) Take by mouth daily.     Olopatadine-Mometasone (RYALTRIS) [id number] MCG/ACT SUSP Place 1-2 sprays into the nose in the morning and at bedtime. 29 g 5   PROLENSA 0.07 % SOLN SMARTSIG:1 Drop(s) Left Eye Every Evening (Patient not taking: Reported on 01/25/2022)     traZODone (DESYREL) 150 MG tablet TAKE 1 TABLET BY MOUTH AT BEDTIME 30 tablet 0   Vibegron (GEMTESA PO) Take by mouth.     No current facility-administered medications for this visit.   Allergies: Allergies  Allergen Reactions   Demerol [Meperidine Hcl] Nausea And Vomiting   Nitrofurantoin Nausea Only    swelling   I reviewed her past medical history, social history, family history, and  environmental history and no significant changes have been reported from her previous visit.  Review of Systems  Constitutional:  Negative for appetite change, chills, fever and unexpected weight change.  HENT:  Positive for postnasal drip, rhinorrhea and voice change. Negative for congestion.   Eyes:  Negative for itching.  Respiratory:  Positive for cough. Negative for chest tightness, shortness of breath and wheezing.   Cardiovascular:  Negative for chest pain.  Gastrointestinal:  Negative for abdominal pain.  Genitourinary:  Negative for difficulty urinating.  Skin:  Negative for rash.  Allergic/Immunologic: Positive for environmental allergies. Negative for food allergies.    Objective: There were no vitals taken for this visit. There is no height or weight on file to calculate BMI. Physical Exam Vitals and nursing note reviewed.  Constitutional:      Appearance: Normal appearance. She is well-developed and normal weight.  HENT:     Head: Normocephalic and atraumatic.     Right Ear: External ear normal.     Left Ear: External ear normal.     Ears:     Comments: Cerumen b/l.    Nose: Nose normal. No congestion or rhinorrhea.     Mouth/Throat:     Mouth: Mucous membranes are moist.     Pharynx: Oropharynx is clear.  Eyes:     Conjunctiva/sclera: Conjunctivae normal.  Cardiovascular:     Rate and Rhythm: Normal rate and regular rhythm.     Heart sounds: Normal heart sounds. No murmur heard.    No friction rub. No gallop.  Pulmonary:     Effort: Pulmonary effort is normal.     Breath sounds: Normal breath sounds. No wheezing or rales.  Musculoskeletal:     Cervical back: Neck supple.  Skin:    General: Skin is warm.     Findings: No rash.  Neurological:     Mental Status: She is alert and oriented to person, place, and time.  Psychiatric:        Mood and Affect: Mood normal.        Behavior: Behavior normal.    Previous notes and tests were reviewed. The plan was  reviewed with the patient/family, and all questions/concerned were addressed.  It was my pleasure to see Robin Banks today and participate in her care. Please feel free to contact me with any questions or concerns.  Sincerely,  Rexene Alberts, DO Allergy & Immunology  [facility] office: [place] office: [phone]

## 2022-04-13 ENCOUNTER — Ambulatory Visit: Payer: Medicare HMO | Admitting: Allergy

## 2022-04-13 DIAGNOSIS — J3089 Other allergic rhinitis: Secondary | ICD-10-CM

## 2022-04-13 DIAGNOSIS — R490 Dysphonia: Secondary | ICD-10-CM

## 2022-04-13 DIAGNOSIS — J4540 Moderate persistent asthma, uncomplicated: Secondary | ICD-10-CM

## 2022-04-18 ENCOUNTER — Ambulatory Visit (INDEPENDENT_AMBULATORY_CARE_PROVIDER_SITE_OTHER): Payer: Medicare HMO | Admitting: Family Medicine

## 2022-04-18 ENCOUNTER — Encounter: Payer: Self-pay | Admitting: Family Medicine

## 2022-04-18 VITALS — BP 121/85 | HR 80 | Temp 98.3°F | Ht 61.02 in | Wt 127.2 lb

## 2022-04-18 DIAGNOSIS — J3089 Other allergic rhinitis: Secondary | ICD-10-CM | POA: Diagnosis not present

## 2022-04-18 DIAGNOSIS — M899 Disorder of bone, unspecified: Secondary | ICD-10-CM | POA: Diagnosis not present

## 2022-04-18 DIAGNOSIS — M8580 Other specified disorders of bone density and structure, unspecified site: Secondary | ICD-10-CM | POA: Diagnosis not present

## 2022-04-18 DIAGNOSIS — Z79899 Other long term (current) drug therapy: Secondary | ICD-10-CM

## 2022-04-18 DIAGNOSIS — R7309 Other abnormal glucose: Secondary | ICD-10-CM | POA: Diagnosis not present

## 2022-04-18 DIAGNOSIS — E559 Vitamin D deficiency, unspecified: Secondary | ICD-10-CM | POA: Diagnosis not present

## 2022-04-18 DIAGNOSIS — F5101 Primary insomnia: Secondary | ICD-10-CM | POA: Diagnosis not present

## 2022-04-18 DIAGNOSIS — Z23 Encounter for immunization: Secondary | ICD-10-CM | POA: Diagnosis not present

## 2022-04-18 DIAGNOSIS — Z1231 Encounter for screening mammogram for malignant neoplasm of breast: Secondary | ICD-10-CM

## 2022-04-18 DIAGNOSIS — Z0000 Encounter for general adult medical examination without abnormal findings: Secondary | ICD-10-CM | POA: Diagnosis not present

## 2022-04-18 DIAGNOSIS — J302 Other seasonal allergic rhinitis: Secondary | ICD-10-CM | POA: Diagnosis not present

## 2022-04-18 DIAGNOSIS — R69 Illness, unspecified: Secondary | ICD-10-CM | POA: Diagnosis not present

## 2022-04-18 LAB — COMPREHENSIVE METABOLIC PANEL
ALT: 26 U/L (ref 0–35)
AST: 28 U/L (ref 0–37)
Albumin: 3.9 g/dL (ref 3.5–5.2)
Alkaline Phosphatase: 60 U/L (ref 39–117)
BUN: 12 mg/dL (ref 6–23)
CO2: 30 mEq/L (ref 19–32)
Calcium: 9 mg/dL (ref 8.4–10.5)
Chloride: 101 mEq/L (ref 96–112)
Creatinine, Ser: 0.69 mg/dL (ref 0.40–1.20)
GFR: 89.1 mL/min (ref >60.00)
Glucose, Bld: 93 mg/dL (ref 70–99)
Potassium: 3.9 mEq/L (ref 3.5–5.1)
Sodium: 138 mEq/L (ref 135–145)
Total Bilirubin: 0.3 mg/dL (ref 0.2–1.2)
Total Protein: 6.4 g/dL (ref 6.0–8.3)

## 2022-04-18 LAB — LIPID PANEL
Cholesterol: 219 mg/dL — ABNORMAL HIGH (ref 0–200)
HDL: 77.5 mg/dL (ref >39.00)
LDL Cholesterol: 132 mg/dL — ABNORMAL HIGH (ref 0–99)
NonHDL: 141.4
Total CHOL/HDL Ratio: 3
Triglycerides: 48 mg/dL (ref 0.0–149.0)
VLDL: 9.6 mg/dL (ref 0.0–40.0)

## 2022-04-18 LAB — HEMOGLOBIN A1C: Hgb A1c MFr Bld: 6 % (ref 4.6–6.5)

## 2022-04-18 LAB — CBC WITH DIFFERENTIAL/PLATELET
Basophils Absolute: 0 10*3/uL (ref 0.0–0.1)
Basophils Relative: 0.8 % (ref 0.0–3.0)
Eosinophils Absolute: 0.2 10*3/uL (ref 0.0–0.7)
Eosinophils Relative: 4.5 % (ref 0.0–5.0)
HCT: 37.4 % (ref 36.0–46.0)
Hemoglobin: 12.5 g/dL (ref 12.0–15.0)
Lymphocytes Relative: 26.2 % (ref 12.0–46.0)
Lymphs Abs: 1.2 10*3/uL (ref 0.7–4.0)
MCHC: 33.5 g/dL (ref 30.0–36.0)
MCV: 94.2 fl (ref 78.0–100.0)
Monocytes Absolute: 0.5 10*3/uL (ref 0.1–1.0)
Monocytes Relative: 11.5 % (ref 3.0–12.0)
Neutro Abs: 2.6 10*3/uL (ref 1.4–7.7)
Neutrophils Relative %: 57 % (ref 43.0–77.0)
Platelets: 216 10*3/uL (ref 150.0–400.0)
RBC: 3.97 Mil/uL (ref 3.87–5.11)
RDW: 13.6 % (ref 11.5–15.5)
WBC: 4.6 10*3/uL (ref 4.0–10.5)

## 2022-04-18 LAB — VITAMIN D 25 HYDROXY (VIT D DEFICIENCY, FRACTURES): VITD: 37.47 ng/mL (ref 30.00–100.00)

## 2022-04-18 LAB — TSH: TSH: 1.87 u[IU]/mL (ref 0.35–5.50)

## 2022-04-18 MED ORDER — TRAZODONE HCL 150 MG PO TABS: 150.0000 mg | ORAL_TABLET | Freq: Every day | ORAL | 1 refills | Status: DC

## 2022-04-18 MED ORDER — BUDESONIDE-FORMOTEROL FUMARATE 80-4.5 MCG/ACT IN AERO: 2.0000 | INHALATION_SPRAY | Freq: Two times a day (BID) | RESPIRATORY_TRACT | 5 refills | Status: DC

## 2022-04-18 MED ORDER — MONTELUKAST SODIUM 10 MG PO TABS: 10.0000 mg | ORAL_TABLET | Freq: Every day | ORAL | 2 refills | Status: DC

## 2022-04-18 MED ORDER — IPRATROPIUM BROMIDE 0.06 % NA SOLN: 2.0000 | Freq: Four times a day (QID) | NASAL | 12 refills | Status: DC

## 2022-04-18 NOTE — Patient Instructions (Addendum)
Return in about 6 months (around 10/04/2022) for Routine chronic condition follow-up.        Great to see you today.  I have refilled the medication(s) we provide.   If labs were collected, we will inform you of lab results once received either by echart message or telephone call.   - echart message- for normal results that have been seen by the patient already.   - telephone call: abnormal results or if patient has not viewed results in their echart.

## 2022-04-18 NOTE — Progress Notes (Signed)
Patient ID: Robin Banks, female  DOB: [birth year], 67 y.o.   MRN: [id number] Patient Care Team    Relationship Specialty Notifications Start End  Ma Hillock, DO PCP - General Family Medicine  10/26/15   Arvella Nigh, MD Consulting Physician Obstetrics and Gynecology  10/28/15   Tanda Rockers, MD Consulting Physician Pulmonary Disease  10/28/15   Doran Stabler, MD Consulting Physician Gastroenterology  02/23/21     Chief Complaint  Patient presents with   Annual Exam    Pt is fasting    Subjective:  Robin Banks is a 67 y.o.  Female  present for CPE and Chronic Conditions/illness Management combination appt All past medical history, surgical history, allergies, family history, immunizations, medications and social history were updated in the electronic medical record today. All recent labs, ED visits and hospitalizations within the last year were reviewed.  Health maintenance:  Colonoscopy: completed:05/23/2019- Dr. Loletha Carrow- 10 yr.  Mammogram: completed: mammogram and pelvic at GYN (Dr. Leatrice Jewels Comb). SBE routinely. Last mammogram 10/2020> she has mam scheduled.  Cervical cancer screening: see above, LMP at 47. Has GYN.  Immunizations: tdap UTD 2022, Influenza UTD 2023 (encouraged yearly), PNA completed series,  zostavax 2014, shingrix series completed 2022 Infectious disease : hep c completed  DEXA: 2021. -1.9 right femoral neck. Osteopenia rt hip, she takes calcium and vitamin D and weight bearing exercise (ordered by gyn) Patient has a Dental home. Hospitalizations/ED visits: reviewed  Insomnia: Patient reported she is has been feeling good, she is doing "great."      04/18/2022    8:10 AM 10/06/2021    3:26 PM 07/22/2021    8:30 AM 06/15/2021    3:00 PM 09/23/2020    3:09 PM  Depression screen PHQ 2/9  Decreased Interest 0 0 1 0 1  Down, Depressed, Hopeless '1 1 1 '$ 0 1  PHQ - 2 Score '1 1 2 '$ 0 2  Altered sleeping  0 0  3  Tired, decreased energy  '1 1  1  '$ Change  in appetite  0 0  0  Feeling bad or failure about yourself   0 0  0  Trouble concentrating  0 0  0  Moving slowly or fidgety/restless  0 0  0  Suicidal thoughts  0 0  0  PHQ-9 Score  '2 3  6      '$ 04/18/2022    8:10 AM 10/06/2021    3:26 PM 07/22/2021    8:31 AM 09/23/2020    3:10 PM  GAD 7 : Generalized Anxiety Score  Nervous, Anxious, on Edge 0 0 0 1  Control/stop worrying 0 0 0 0  Worry too much - different things 0 0 0 0  Trouble relaxing 1 1 0 0  Restless 0 0 0 0  Easily annoyed or irritable 0 0 0 0  Afraid - awful might happen 0 0 0 0  Total GAD 7 Score 1 1 0 1   Immunization History  Administered Date(s) Administered   Fluad Quad(high Dose 65+) 12/05/2018, 01/10/2020, 12/26/2021   Influenza,inj,Quad PF,6+ Mos 03/24/2016, 01/24/2017, 12/04/2017   Influenza-Unspecified 12/26/2013, 01/24/2017, 12/26/2020   PFIZER(Purple Top)SARS-COV-2 Vaccination 06/02/2019, 07/02/2019, 01/28/2020   Pneumococcal Conjugate-13 02/03/2020   Pneumococcal Polysaccharide-23 11/20/2012, 02/23/2021   Td 04/29/2020   Tdap 02/03/2009, 04/30/2020   Zoster Recombinat (Shingrix) 03/09/2020, 06/15/2020   Zoster, Live 03/15/2013   Past Medical History:  Diagnosis Date   Allergy    Angio-edema  Asthma    Cataract    Chicken pox    Compression fracture of thoracic spine, non-traumatic (HCC)    T7-8   Depression    Excess ear wax    GERD (gastroesophageal reflux disease)    Hyperlipidemia    no meds taken   Insomnia    Menopause    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 Matilde Sprang   CARPAL TUNNEL RELEASE Bilateral 2014   CATARACT  EXTRACTION  12/2020   CATARACT EXTRACTION  01/2021   COLONOSCOPY     TUBAL LIGATION Bilateral 1978   Family History  Problem Relation Age of Onset   Hypertension Father    CAD Father    Hearing loss Father    Heart disease Father    COPD Father    Hyperlipidemia Mother    Arthritis Mother    Alcohol abuse Paternal Grandfather    Heart attack Paternal Grandfather    CAD Paternal Grandfather    Dementia Paternal Grandmother    Aortic aneurysm Maternal Grandmother    Cerebral palsy Son        Mild CP by records   Kidney nephrosis Son        FSG, kidney transplant.    Colon cancer Neg Hx    Esophageal cancer Neg Hx    Stomach cancer Neg Hx    Rectal cancer Neg Hx    Allergic rhinitis Neg Hx    Asthma Neg Hx    Eczema Neg Hx    Urticaria Neg Hx    Social History   Social History Narrative   Married to [place]). Three children [name] and [name].    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    Takes herbal remedies, daily vitamin,    Wears seatbelt   Exercises 3x a week   Smoke detector in the home.    Firearms in the home (locked)   Feels safe in relationship.     Allergies as of 04/18/2022       Reactions   Demerol [meperidine Hcl] Nausea And Vomiting   Nitrofurantoin Nausea Only   swelling        Medication List        Accurate as of April 18, 2022  9:23 AM. If you have any questions, ask your nurse or doctor.          STOP taking these medications    MACA PO Stopped by: Howard Pouch, DO   Prolensa 0.07 % Soln Generic drug: Bromfenac Sodium Stopped by: Howard Pouch, DO   Ryaltris [phone] MCG/ACT Susp Generic drug: Olopatadine-Mometasone Stopped by: Howard Pouch, DO       TAKE these medications    albuterol 108 (90 Base) MCG/ACT inhaler Commonly known as: Ventolin HFA Inhale 2 puffs into the lungs every 4 (four) hours as needed for wheezing or shortness of breath (coughing fits).   BIOTIN PO Take by mouth.   budesonide-formoterol  80-4.5 MCG/ACT inhaler Commonly known as: Symbicort Inhale 2 puffs into the lungs in the morning and at bedtime. with spacer and rinse mouth afterwards.   CALCIUM PLUS VITAMIN  D3 PO Take by mouth daily.   cetirizine 10 MG tablet Commonly known as: ZYRTEC Take 10 mg by mouth daily.   GEMTESA PO Take by mouth.   ipratropium 0.06 % nasal spray Commonly known as: ATROVENT Place 2 sprays into both nostrils 4 (four) times daily. Started by: Howard Pouch, DO   montelukast 10 MG tablet Commonly known as: Singulair Take 1 tablet (10 mg total) by mouth at bedtime.   MULTIVITAMIN PO Take by mouth daily.   traZODone 150 MG tablet Commonly known as: DESYREL Take 1 tablet (150 mg total) by mouth at bedtime.        All past medical history, surgical history, allergies, family history, immunizations andmedications were updated in the EMR today and reviewed under the history and medication portions of their EMR.     No results found for this or any previous visit (from the past 2160 hour(s)).    ROS: 14 pt review of systems performed and negative (unless mentioned in an HPI)  Objective: BP 121/85   Pulse 80   Temp 98.3 F (36.8 C)   Ht 5' 1.02" (1.55 m)   Wt 127 lb 3.2 oz (57.7 kg)   SpO2 97%   BMI 24.02 kg/m  Physical Exam Vitals and nursing note reviewed.  Constitutional:      General: She is not in acute distress.    Appearance: Normal appearance. She is not ill-appearing or toxic-appearing.  HENT:     Head: Normocephalic and atraumatic.     Right Ear: Tympanic membrane, ear canal and external ear normal. There is no impacted cerumen.     Left Ear: Tympanic membrane, ear canal and external ear normal. There is no impacted cerumen.     Nose: No congestion or rhinorrhea.     Mouth/Throat:     Mouth: Mucous membranes are moist.     Pharynx: Oropharynx is clear. No oropharyngeal exudate or posterior oropharyngeal erythema.  Eyes:     General: No scleral icterus.        Right eye: No discharge.        Left eye: No discharge.     Extraocular Movements: Extraocular movements intact.     Conjunctiva/sclera: Conjunctivae normal.     Pupils: Pupils are equal, round, and reactive to light.  Cardiovascular:     Rate and Rhythm: Normal rate and regular rhythm.     Pulses: Normal pulses.     Heart sounds: Normal heart sounds. No murmur heard.    No friction rub. No gallop.  Pulmonary:     Effort: Pulmonary effort is normal. No respiratory distress.     Breath sounds: Normal breath sounds. No stridor. No wheezing, rhonchi or rales.  Chest:     Chest wall: No tenderness.  Abdominal:     General: Abdomen is flat. Bowel sounds are normal. There is no distension.     Palpations: Abdomen is soft. There is no mass.     Tenderness: There is no abdominal tenderness. There is no right CVA tenderness, left CVA tenderness, guarding or rebound.     Hernia: No hernia is present.  Musculoskeletal:        General: No swelling, tenderness or deformity. Normal range of motion.     Cervical back: Normal range of motion and neck supple. No rigidity or tenderness.     Right lower leg: No edema.     Left lower leg: No edema.  Lymphadenopathy:     Cervical: No cervical adenopathy.  Skin:    General: Skin is warm and dry.     Coloration: Skin is not jaundiced or pale.     Findings: No bruising, erythema, lesion or rash.  Neurological:     General: No focal deficit present.     Mental Status: She is alert and oriented to person, place, and time. Mental status is at baseline.     Cranial Nerves: No cranial nerve deficit.     Sensory: No sensory deficit.     Motor: No weakness.     Coordination: Coordination normal.     Gait: Gait normal.     Deep Tendon Reflexes: Reflexes normal.  Psychiatric:        Mood and Affect: Mood normal.        Behavior: Behavior normal.        Thought Content: Thought content normal.        Judgment: Judgment normal.      No results  found.  Assessment/plan: Robin Banks is a 67 y.o. female present for CPE and chronic conditions management routine follow-up combination appointment Insomnia, unspecified type Stable Continue trazodone 150 mg nightly. -Patient would like to avoid any of the controlled substances including hypnotics. -Could consider gabapentin  -Tried: Trazodone, amitriptyline, Remeron - Follow-up every 5.5 months on current issues, sooner if still not sleeping well.  Could consider a sleep specialist to rule out other sleep disorder.  Moderate persistent asthma without complication/allergic rhinitis/seasomal allergies Stable Continue inhalers and Singulair prescribed by [facility] Continue OTC oral antihistamine PRN    Elevated a1c 6.3 > 5.4> 5.5>5.8> A1c collected today - CBC with Differential/Platelet - Comprehensive metabolic panel - Hemoglobin A1c - Lipid panel Disorder of bone/osteopenia/vitamin D deficiency - Vitamin D collected today She supplements with 1500 mg calcium with vit d (unknown total dose) Dexa completed at gyn UTD  Breast cancer screening by mammogram Patient has her mammogram scheduled through gynecology Encounter for long-term current use of medication - CBC with Differential/Platelet - Lipid panel Seasonal and perennial allergic rhinitis Stable Continue Singulair. Continue albuterol as needed. Continue Symbicort 2 puffs twice daily. Continue OTC generic Zyrtec nightly. Routine general medical examination at a health care facility Colonoscopy: completed:05/23/2019- Dr. Loletha Carrow- 10 yr.  Mammogram: completed: mammogram and pelvic at GYN (Dr. Leatrice Jewels Comb). SBE routinely. Last mammogram 10/2020> she has mam scheduled.  Cervical cancer screening: see above, LMP at 47. Has GYN.  Immunizations: tdap UTD 2022, Influenza UTD 2023 (encouraged yearly), PNA completed series,  zostavax 2014, shingrix series completed 2022 Infectious disease : hep c completed  DEXA: 2021. -1.9 right  femoral neck. Osteopenia rt hip, she takes calcium and vitamin D and weight bearing exercise (ordered by gyn) Patient was encouraged to exercise greater than 150 minutes a week. Patient was encouraged to choose a diet filled with fresh fruits and vegetables, and lean meats. AVS provided to patient today for education/recommendation on gender specific health and safety maintenance.  . Return in about 6 months (around 10/04/2022) for Routine chronic condition follow-up.    Orders Placed This Encounter  Procedures   CBC with Differential/Platelet   Comprehensive metabolic panel   Hemoglobin A1c   Lipid panel   TSH   Vitamin D (25 hydroxy)   Meds ordered this encounter  Medications   traZODone (DESYREL) 150 MG tablet    Sig: Take 1 tablet (150 mg total) by mouth at bedtime.    Dispense:  135 tablet    Refill:  1   budesonide-formoterol (  SYMBICORT) 80-4.5 MCG/ACT inhaler    Sig: Inhale 2 puffs into the lungs in the morning and at bedtime. with spacer and rinse mouth afterwards.    Dispense:  1 each    Refill:  5   montelukast (SINGULAIR) 10 MG tablet    Sig: Take 1 tablet (10 mg total) by mouth at bedtime.    Dispense:  90 tablet    Refill:  2   ipratropium (ATROVENT) 0.06 % nasal spray    Sig: Place 2 sprays into both nostrils 4 (four) times daily.    Dispense:  15 mL    Refill:  12    Referral Orders  No referral(s) requested today     Electronically signed by: Howard Pouch, [place]

## 2022-04-29 DIAGNOSIS — N3946 Mixed incontinence: Secondary | ICD-10-CM | POA: Diagnosis not present

## 2022-04-29 DIAGNOSIS — R350 Frequency of micturition: Secondary | ICD-10-CM | POA: Diagnosis not present

## 2022-05-10 DIAGNOSIS — N958 Other specified menopausal and perimenopausal disorders: Secondary | ICD-10-CM | POA: Diagnosis not present

## 2022-05-10 DIAGNOSIS — Z6824 Body mass index (BMI) 24.0-24.9, adult: Secondary | ICD-10-CM | POA: Diagnosis not present

## 2022-05-10 DIAGNOSIS — Z01419 Encounter for gynecological examination (general) (routine) without abnormal findings: Secondary | ICD-10-CM | POA: Diagnosis not present

## 2022-05-10 DIAGNOSIS — Z1231 Encounter for screening mammogram for malignant neoplasm of breast: Secondary | ICD-10-CM | POA: Diagnosis not present

## 2022-05-10 LAB — HM MAMMOGRAPHY

## 2022-05-11 ENCOUNTER — Other Ambulatory Visit: Payer: Self-pay | Admitting: Obstetrics and Gynecology

## 2022-05-11 DIAGNOSIS — Z8249 Family history of ischemic heart disease and other diseases of the circulatory system: Secondary | ICD-10-CM

## 2022-06-10 ENCOUNTER — Ambulatory Visit
Admission: RE | Admit: 2022-06-10 | Discharge: 2022-06-10 | Disposition: A | Discharge status: Home / Self Care | Payer: No Typology Code available for payment source | Source: Ambulatory Visit | Attending: Obstetrics and Gynecology | Admitting: Obstetrics and Gynecology

## 2022-06-10 DIAGNOSIS — Z8249 Family history of ischemic heart disease and other diseases of the circulatory system: Secondary | ICD-10-CM

## 2022-06-20 ENCOUNTER — Telehealth: Payer: Self-pay | Admitting: Family Medicine

## 2022-06-20 NOTE — Telephone Encounter (Signed)
Contacted [facility] NATILYNN DURIE to schedule their annual wellness visit. Appointment made for 06/29/2022.  [place] Direct Dial [phone]

## 2022-06-29 ENCOUNTER — Ambulatory Visit (INDEPENDENT_AMBULATORY_CARE_PROVIDER_SITE_OTHER): Payer: Medicare HMO

## 2022-06-29 VITALS — Wt 127.0 lb

## 2022-06-29 DIAGNOSIS — Z0000 Encounter for general adult medical examination without abnormal findings: Secondary | ICD-10-CM | POA: Diagnosis not present

## 2022-06-29 NOTE — Progress Notes (Signed)
I connected with  [name] on 06/29/22 by a audio enabled telemedicine application and verified that I am speaking with the correct person using two identifiers.  Patient Location: Home  Provider Location: Home Office  I discussed the limitations of evaluation and management by telemedicine. The patient expressed understanding and agreed to proceed.   Subjective:   Robin Banks is a 67 y.o. female who presents for Medicare Annual (Subsequent) preventive examination.  Review of Systems     Cardiac Risk Factors include: advanced age (>18men, >77 women)     Objective:    Today's Vitals   06/29/22 1518  Weight: 127 lb (57.6 kg)   Body mass index is 23.98 kg/m.     06/29/2022    3:23 PM 05/13/2020    3:47 PM  Advanced Directives  Does Patient Have a Medical Advance Directive? Yes Yes  Type of Paramedic of [place];Living will [place];Living will  Copy of [place] in Chart? No - copy requested No - copy requested    Current Medications (verified) Outpatient Encounter Medications as of 06/29/2022  Medication Sig   BIOTIN PO Take by mouth.   budesonide-formoterol (SYMBICORT) 80-4.5 MCG/ACT inhaler Inhale 2 puffs into the lungs in the morning and at bedtime. with spacer and rinse mouth afterwards.   Calcium Carb-Cholecalciferol (CALCIUM PLUS VITAMIN D3 PO) Take by mouth daily.   cetirizine (ZYRTEC) 10 MG tablet Take 10 mg by mouth daily.   fluticasone (FLONASE) 50 MCG/ACT nasal spray Place into both nostrils.   FLUZONE HIGH-DOSE QUADRIVALENT 0.7 ML SUSY    ipratropium (ATROVENT) 0.06 % nasal spray Place 2 sprays into both nostrils 4 (four) times daily.   montelukast (SINGULAIR) 10 MG tablet Take 1 tablet (10 mg total) by mouth at bedtime.   Multiple Vitamin (MULTIVITAMIN PO) Take by mouth daily.   traZODone (DESYREL) 150 MG tablet Take 1 tablet (150 mg total) by mouth at bedtime.   Vibegron (GEMTESA PO)  Take by mouth.   albuterol (VENTOLIN HFA) 108 (90 Base) MCG/ACT inhaler Inhale 2 puffs into the lungs every 4 (four) hours as needed for wheezing or shortness of breath (coughing fits). (Patient not taking: Reported on 04/18/2022)   No facility-administered encounter medications on file as of 06/29/2022.    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 Matilde Sprang   CARPAL TUNNEL RELEASE Bilateral 2014   CATARACT EXTRACTION  12/2020   CATARACT EXTRACTION  01/2021   COLONOSCOPY     TUBAL LIGATION Bilateral 1978   Family History  Problem Relation Age of Onset   Hypertension Father    CAD Father    Hearing loss Father    Heart disease Father    COPD Father  Hyperlipidemia Mother    Arthritis Mother    Alcohol abuse Paternal Grandfather    Heart attack Paternal Grandfather    CAD Paternal Grandfather    Dementia Paternal Grandmother    Aortic aneurysm Maternal Grandmother    Cerebral palsy Son        Mild CP by records   Kidney nephrosis Son        FSG, kidney transplant.    Colon cancer Neg Hx    Esophageal cancer Neg Hx    Stomach cancer Neg Hx    Rectal cancer Neg Hx    Allergic rhinitis Neg Hx    Asthma Neg Hx    Eczema Neg Hx    Urticaria Neg Hx    Social History   Socioeconomic History   Marital status: Married    Spouse name: Not on file   Number of children:  Not on file   Years of education: Not on file   Highest education level: Not on file  Occupational History   Not on file  Tobacco Use   Smoking status: Never   Smokeless tobacco: Never  Vaping Use   Vaping Use: Never used  Substance and Sexual Activity   Alcohol use: Yes    Alcohol/week: 1.0 standard drink of alcohol    Types: 1 Glasses of wine per week   Drug use: No   Sexual activity: Yes    Partners: Male    Comment: married  Other Topics Concern   Not on file  Social History Narrative   Married to [place]). Three children [name] and [name].    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    Takes herbal remedies, daily vitamin,    Wears seatbelt   Exercises 3x a week   Smoke detector in the home.    Firearms in the home (locked)   Feels safe in relationship.    Social Determinants of Health   Financial Resource Strain: Low Risk  (06/29/2022)   Overall Financial Resource Strain (CARDIA)    Difficulty of Paying Living Expenses: Not hard at all  Food Insecurity: No Food Insecurity (06/29/2022)   Hunger Vital Sign    Worried About Running Out of Food in the Last Year: Never true    Ran Out of Food in the Last Year: Never true  Transportation Needs: No Transportation Needs (06/29/2022)   PRAPARE - Hydrologist (Medical): No    Lack of Transportation (Non-Medical): No  Physical Activity: Sufficiently Active (06/29/2022)   Exercise Vital Sign    Days of Exercise per Week: 5 days    Minutes of Exercise per Session: 60 min  Stress: No Stress Concern Present (06/29/2022)   [place]    Feeling of Stress : Only a little  Social Connections: Socially Integrated (06/29/2022)   Social Connection and Isolation Panel [NHANES]    Frequency of Communication with Friends and Family: More than three times a week    Frequency of Social Gatherings with Friends and Family: More than three times a week     Attends Religious Services: More than 4 times per year    Active Member of [employer] or Organizations: Yes    Attends Archivist Meetings: 1 to 4 times per year    Marital Status: Married    Tobacco Counseling Counseling given: Not Answered   Clinical Intake:  Pre-visit preparation completed: Yes  Pain : No/denies pain  BMI - recorded: 23.98 Nutritional Status: BMI of 19-24  Normal Nutritional Risks: None Diabetes: No  How often do you need to have someone help you when you read instructions, pamphlets, or other written materials from your doctor or pharmacy?: 1 - Never  Diabetic?no  Interpreter Needed?: No  Information entered by :: Charlott Rakes, LPN   Activities of Daily Living    06/29/2022    3:24 PM  In your present state of health, do you have any difficulty performing the following activities:  Hearing? 1  Comment hearing aids  Vision? 0  Difficulty concentrating or making decisions? 0  Walking or climbing stairs? 0  Dressing or bathing? 0  Doing errands, shopping? 0  Preparing Food and eating ? N  Using the Toilet? N  In the past six months, have you accidently leaked urine? N  Do you have problems with loss of bowel control? N  Managing your Medications? N  Managing your Finances? N  Housekeeping or managing your Housekeeping? N    Patient Care Team: Ma Hillock, DO as PCP - General (Family Medicine) Arvella Nigh, MD as Consulting Physician (Obstetrics and Gynecology) Tanda Rockers, MD as Consulting Physician (Pulmonary Disease) Danis, Kirke Corin, MD as Consulting Physician (Gastroenterology)  Indicate any recent Medical Services you may have received from other than [facility] providers in the past year (date may be approximate).     Assessment:   This is a routine wellness examination for [place].  Hearing/Vision screen Hearing Screening - Comments:: Pt wears hearing aids  Vision Screening - Comments:: Pt follows up with Dr Joya San  for annual eye exams   Dietary issues and exercise activities discussed: Current Exercise Habits: Home exercise routine, Type of exercise: Other - see comments, Time (Minutes): 60, Frequency (Times/Week): 6, Weekly Exercise (Minutes/Week): 360   Goals Addressed             This Visit's Progress    Patient Stated       Be more active       Depression Screen    06/29/2022    3:21 PM 04/18/2022    8:10 AM 10/06/2021    3:26 PM 07/22/2021    8:30 AM 06/15/2021    3:00 PM 09/23/2020    3:09 PM 07/31/2020    3:53 PM  PHQ 2/9 Scores  PHQ - 2 Score 0 1 1 2  0 2 1  PHQ- 9 Score   2 3  6 2     Fall Risk    06/29/2022    3:24 PM 06/15/2021    3:01 PM 02/23/2021    3:35 PM 05/13/2020    3:48 PM 01/10/2020    3:37 PM  Fall Risk   Falls in the past year? 0 0 0 0 0  Number falls in past yr: 0 0 0 0 0  Injury with Fall? 0 0 0 0 0  Risk for fall due to : Impaired vision No Fall Risks     Follow up Falls prevention discussed Falls evaluation completed Falls evaluation completed Falls prevention discussed     FALL RISK PREVENTION PERTAINING TO THE HOME:  Any stairs in or around the home? Yes  If so, are there any without handrails? No  Home free of loose throw rugs in walkways, pet beds, electrical cords, etc? Yes  Adequate lighting in your home to reduce risk of falls? Yes   ASSISTIVE DEVICES UTILIZED TO PREVENT FALLS:  Life alert? No  Use of  a cane, walker or w/c? No  Grab bars in the bathroom? No  Shower chair or bench in shower? No  Elevated toilet seat or a handicapped toilet? No   TIMED UP AND GO:  Was the test performed? No .    Cognitive Function:        06/29/2022    3:25 PM 06/15/2021    3:02 PM  6CIT Screen  What Year? 0 points 0 points  What month? 0 points 0 points  What time? 0 points 0 points  Count back from 20 0 points 0 points  Months in reverse 0 points 0 points  Repeat phrase 0 points 0 points  Total Score 0 points 0 points     Immunizations Immunization History  Administered Date(s) Administered   Fluad Quad(high Dose 65+) 12/05/2018, 01/10/2020, 12/26/2021   Influenza,inj,Quad PF,6+ Mos 03/24/2016, 01/24/2017, 12/04/2017   Influenza-Unspecified 12/26/2013, 01/24/2017, 12/26/2020   PFIZER(Purple Top)SARS-COV-2 Vaccination 06/02/2019, 07/02/2019, 01/28/2020   Pneumococcal Conjugate-13 02/03/2020   Pneumococcal Polysaccharide-23 11/20/2012, 02/23/2021   Td 04/29/2020   Tdap 02/03/2009, 04/30/2020   Zoster Recombinat (Shingrix) 03/09/2020, 06/15/2020   Zoster, Live 03/15/2013    TDAP status: Up to date  Flu Vaccine status: Up to date  Pneumococcal vaccine status: Up to date  Covid-19 vaccine status: Completed vaccines  Qualifies for Shingles Vaccine? Yes   Zostavax completed Yes   Shingrix Completed?: Yes  Screening Tests Health Maintenance  Topic Date Due   INFLUENZA VACCINE  10/27/2022   MAMMOGRAM  05/11/2023   Medicare Annual Wellness (AWV)  06/29/2023   DEXA SCAN  05/10/2025   COLONOSCOPY (Pts 45-75yrs Insurance coverage will need to be confirmed)  05/22/2029   DTaP/Tdap/Td (4 - Td or Tdap) 04/30/2030   Pneumonia Vaccine 48+ Years old  Completed   Hepatitis C Screening  Completed   Zoster Vaccines- Shingrix  Completed   HPV VACCINES  Aged Out   COVID-19 Vaccine  Discontinued    Health Maintenance  There are no preventive care reminders to display for this patient.   Colorectal cancer screening: Type of screening: Colonoscopy. Completed 05/23/19. Repeat every 10 years    Bone scan and Mammogram completed 05/10/2022 by Dr Arvella Nigh  Additional Screening:  Hepatitis C Screening: Completed 01/31/20  Vision Screening: Recommended annual ophthalmology exams for early detection of glaucoma and other disorders of the eye. Is the patient up to date with their annual eye exam?  Yes  Who is the provider or what is the name of the office in which the patient attends annual eye exams?  Dr Joya San If pt is not established with a provider, would they like to be referred to a provider to establish care? No .   Dental Screening: Recommended annual dental exams for proper oral hygiene  Community Resource Referral / Chronic Care Management: CRR required this visit?  No   CCM required this visit?  No      Plan:     I have personally reviewed and noted the following in the patient's chart:   Medical and social history Use of alcohol, tobacco or illicit drugs  Current medications and supplements including opioid prescriptions. Patient is not currently taking opioid prescriptions. Functional ability and status Nutritional status Physical activity Advanced directives List of other physicians Hospitalizations, surgeries, and ER visits in previous 12 months Vitals Screenings to include cognitive, depression, and falls Referrals and appointments  In addition, I have reviewed and discussed with patient certain preventive protocols, quality metrics, and best practice  recommendations. A written personalized care plan for preventive services as well as general preventive health recommendations were provided to patient.     Willette Brace, LPN   [id number]   Nurse Notes: none

## 2022-06-29 NOTE — Patient Instructions (Signed)
Robin Banks , Thank you for taking time to come for your Medicare Wellness Visit. I appreciate your ongoing commitment to your health goals. Please review the following plan we discussed and let me know if I can assist you in the future.   These are the goals we discussed:  Goals      Patient Stated     Increase activity     Patient Stated     Be more active        This is a list of the screening recommended for you and due dates:  Health Maintenance  Topic Date Due   Mammogram  10/31/2021   DEXA scan (bone density measurement)  03/28/2022   Flu Shot  10/27/2022   Medicare Annual Wellness Visit  06/29/2023   Colon Cancer Screening  05/22/2029   DTaP/Tdap/Td vaccine (4 - Td or Tdap) 04/30/2030   Pneumonia Vaccine  Completed   Hepatitis C Screening: USPSTF Recommendation to screen - Ages 12-79 yo.  Completed   Zoster (Shingles) Vaccine  Completed   HPV Vaccine  Aged Out   COVID-19 Vaccine  Discontinued    Advanced directives: Please bring a copy of your health care power of attorney and living will to the office at your convenience.  Conditions/risks identified: be more active   Next appointment: Follow up in one year for your annual wellness visit    Preventive Care 65 Years and Older, Female Preventive care refers to lifestyle choices and visits with your health care provider that can promote health and wellness. What does preventive care include? A yearly physical exam. This is also called an annual well check. Dental exams once or twice a year. Routine eye exams. Ask your health care provider how often you should have your eyes checked. Personal lifestyle choices, including: Daily care of your teeth and gums. Regular physical activity. Eating a healthy diet. Avoiding tobacco and drug use. Limiting alcohol use. Practicing safe sex. Taking low-dose aspirin every day. Taking vitamin and mineral supplements as recommended by your health care provider. What happens  during an annual well check? The services and screenings done by your health care provider during your annual well check will depend on your age, overall health, lifestyle risk factors, and family history of disease. Counseling  Your health care provider may ask you questions about your: Alcohol use. Tobacco use. Drug use. Emotional well-being. Home and relationship well-being. Sexual activity. Eating habits. History of falls. Memory and ability to understand (cognition). Work and work Statistician. Reproductive health. Screening  You may have the following tests or measurements: Height, weight, and BMI. Blood pressure. Lipid and cholesterol levels. These may be checked every 5 years, or more frequently if you are over 81 years old. Skin check. Lung cancer screening. You may have this screening every year starting at age 37 if you have a 30-pack-year history of smoking and currently smoke or have quit within the past 15 years. Fecal occult blood test (FOBT) of the stool. You may have this test every year starting at age 75. Flexible sigmoidoscopy or colonoscopy. You may have a sigmoidoscopy every 5 years or a colonoscopy every 10 years starting at age 85. Hepatitis C blood test. Hepatitis B blood test. Sexually transmitted disease (STD) testing. Diabetes screening. This is done by checking your blood sugar (glucose) after you have not eaten for a while (fasting). You may have this done every 1-3 years. Bone density scan. This is done to screen for osteoporosis. You may have  this done starting at age 25. Mammogram. This may be done every 1-2 years. Talk to your health care provider about how often you should have regular mammograms. Talk with your health care provider about your test results, treatment options, and if necessary, the need for more tests. Vaccines  Your health care provider may recommend certain vaccines, such as: Influenza vaccine. This is recommended every  year. Tetanus, diphtheria, and acellular pertussis (Tdap, Td) vaccine. You may need a Td booster every 10 years. Zoster vaccine. You may need this after age 58. Pneumococcal 13-valent conjugate (PCV13) vaccine. One dose is recommended after age 38. Pneumococcal polysaccharide (PPSV23) vaccine. One dose is recommended after age 24. Talk to your health care provider about which screenings and vaccines you need and how often you need them. This information is not intended to replace advice given to you by your health care provider. Make sure you discuss any questions you have with your health care provider. Document Released: 04/10/2015 Document Revised: 12/02/2015 Document Reviewed: 01/13/2015 Elsevier Interactive Patient Education  2017 [place] Prevention in the Home Falls can cause injuries. They can happen to people of all ages. There are many things you can do to make your home safe and to help prevent falls. What can I do on the outside of my home? Regularly fix the edges of walkways and driveways and fix any cracks. Remove anything that might make you trip as you walk through a door, such as a raised step or threshold. Trim any bushes or trees on the path to your home. Use bright outdoor lighting. Clear any walking paths of anything that might make someone trip, such as rocks or tools. Regularly check to see if handrails are loose or broken. Make sure that both sides of any steps have handrails. Any raised decks and porches should have guardrails on the edges. Have any leaves, snow, or ice cleared regularly. Use sand or salt on walking paths during winter. Clean up any spills in your garage right away. This includes oil or grease spills. What can I do in the bathroom? Use night lights. Install grab bars by the toilet and in the tub and shower. Do not use towel bars as grab bars. Use non-skid mats or decals in the tub or shower. If you need to sit down in the shower, use a  plastic, non-slip stool. Keep the floor dry. Clean up any water that spills on the floor as soon as it happens. Remove soap buildup in the tub or shower regularly. Attach bath mats securely with double-sided non-slip rug tape. Do not have throw rugs and other things on the floor that can make you trip. What can I do in the bedroom? Use night lights. Make sure that you have a light by your bed that is easy to reach. Do not use any sheets or blankets that are too big for your bed. They should not hang down onto the floor. Have a firm chair that has side arms. You can use this for support while you get dressed. Do not have throw rugs and other things on the floor that can make you trip. What can I do in the kitchen? Clean up any spills right away. Avoid walking on wet floors. Keep items that you use a lot in easy-to-reach places. If you need to reach something above you, use a strong step stool that has a grab bar. Keep electrical cords out of the way. Do not use floor polish or  wax that makes floors slippery. If you must use wax, use non-skid floor wax. Do not have throw rugs and other things on the floor that can make you trip. What can I do with my stairs? Do not leave any items on the stairs. Make sure that there are handrails on both sides of the stairs and use them. Fix handrails that are broken or loose. Make sure that handrails are as long as the stairways. Check any carpeting to make sure that it is firmly attached to the stairs. Fix any carpet that is loose or worn. Avoid having throw rugs at the top or bottom of the stairs. If you do have throw rugs, attach them to the floor with carpet tape. Make sure that you have a light switch at the top of the stairs and the bottom of the stairs. If you do not have them, ask someone to add them for you. What else can I do to help prevent falls? Wear shoes that: Do not have high heels. Have rubber bottoms. Are comfortable and fit you  well. Are closed at the toe. Do not wear sandals. If you use a stepladder: Make sure that it is fully opened. Do not climb a closed stepladder. Make sure that both sides of the stepladder are locked into place. Ask someone to hold it for you, if possible. Clearly mark and make sure that you can see: Any grab bars or handrails. First and last steps. Where the edge of each step is. Use tools that help you move around (mobility aids) if they are needed. These include: Canes. Walkers. Scooters. Crutches. Turn on the lights when you go into a dark area. Replace any light bulbs as soon as they burn out. Set up your furniture so you have a clear path. Avoid moving your furniture around. If any of your floors are uneven, fix them. If there are any pets around you, be aware of where they are. Review your medicines with your doctor. Some medicines can make you feel dizzy. This can increase your chance of falling. Ask your doctor what other things that you can do to help prevent falls. This information is not intended to replace advice given to you by your health care provider. Make sure you discuss any questions you have with your health care provider. Document Released: 01/08/2009 Document Revised: 08/20/2015 Document Reviewed: 04/18/2014 Elsevier Interactive Patient Education  2017 [employer].

## 2022-07-12 ENCOUNTER — Ambulatory Visit: Payer: Medicare HMO | Admitting: Cardiovascular Disease

## 2022-07-26 ENCOUNTER — Telehealth: Payer: Self-pay | Admitting: Licensed Clinical Social Worker

## 2022-07-26 NOTE — Patient Outreach (Signed)
Erroneous Encounter

## 2022-08-02 ENCOUNTER — Ambulatory Visit: Payer: Medicare HMO | Admitting: Allergy

## 2022-08-03 NOTE — Progress Notes (Unsigned)
Cardiology Office Note:   Date:  08/04/2022  NAME:  Robin Banks    MRN: [id number] DOB:  Apr 01, 1955   PCP:  Natalia Leatherwood, DO  Cardiologist:  None  Electrophysiologist:  None   Referring MD: Natalia Leatherwood, DO   Chief Complaint  Patient presents with   coronary calcium    History of Present Illness:   Robin Banks is a 67 y.o. female with a hx of coronary calcium, asthma who is being seen today for the evaluation of coronary calcium at the request of Kuneff, Renee A, DO.  She presents for follow-up of coronary artery calcium testing.  Found to have elevated coronary calcium score.  Lipids not that bad.  She reports she is walking 4 to 5 miles per day when she exercises.  She can do aerobic and strength training without limitations.  She does have a family history of heart disease in her father.  He did have bypass surgery in his later years.  Does not appear to have a family history of early heart disease.  She does not have high blood pressure.  No diabetes.  She really has no other risk factors.  She does not smoke.  She reports alcohol in moderation.  No drug use.  She denies any major symptoms in office today.  Reports no chest pains or trouble breathing.  CV examination normal.  EKG normal.  Problem List Coronary calcium -CAC 80.5 (73rd percentile) 2. HLD -T chol 219, HDL 77, LDL 132, TG 48 3. 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 Sherron Monday   CARPAL TUNNEL RELEASE Bilateral 2014   CATARACT EXTRACTION  12/2020   CATARACT EXTRACTION  01/2021   COLONOSCOPY     TUBAL LIGATION Bilateral 1978    Current Medications: Current Meds  Medication Sig   albuterol (VENTOLIN HFA) 108 (90 Base) MCG/ACT inhaler Inhale 2 puffs into the lungs every 4 (four) hours as needed for wheezing or shortness of breath (coughing fits).   atorvastatin (LIPITOR) 20 MG tablet Take 1 tablet (20 mg total) by mouth daily.   BIOTIN PO Take by mouth.   budesonide-formoterol (SYMBICORT) 80-4.5 MCG/ACT inhaler Inhale 2 puffs into the lungs in the morning and at bedtime. with spacer and rinse mouth afterwards.   Calcium Carb-Cholecalciferol (CALCIUM PLUS VITAMIN D3 PO) Take by mouth daily.   cetirizine (ZYRTEC) 10 MG tablet Take 10 mg by mouth daily.   fluticasone (FLONASE) 50 MCG/ACT nasal spray Place into both nostrils.   FLUZONE HIGH-DOSE QUADRIVALENT 0.7 ML SUSY    ipratropium (ATROVENT) 0.06 % nasal spray Place 2 sprays into both nostrils 4 (four) times daily.   montelukast (SINGULAIR) 10 MG tablet Take 1 tablet (10 mg total) by mouth  at bedtime.   Multiple Vitamin (MULTIVITAMIN PO) Take by mouth daily.   traZODone (DESYREL) 150 MG tablet Take 1 tablet (150 mg total) by mouth at bedtime.   Vibegron (GEMTESA PO) Take by mouth.     Allergies:    Demerol [meperidine hcl] and Nitrofurantoin   Social History: Social History   Socioeconomic History   Marital status: Married    Spouse name: Not on file   Number of children: 2   Years of education: Not on file   Highest education level: Not on file  Occupational History   Occupation: Oncologist  Tobacco Use   Smoking status: Never   Smokeless tobacco: Never  Vaping Use   Vaping Use: Never used  Substance and Sexual Activity   Alcohol use: Yes    Alcohol/week: 1.0 standard drink of alcohol    Types: 1  Glasses of wine per week   Drug use: No   Sexual activity: Yes    Partners: Male    Comment: married  Other Topics Concern   Not on file  Social History Narrative   Married to [place]). Three children Biagio Borg and Madelaine Bhat.    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    Takes herbal remedies, daily vitamin,    Wears seatbelt   Exercises 3x a week   Smoke detector in the home.    Firearms in the home (locked)   Feels safe in relationship.    Social Determinants of Health   Financial Resource Strain: Low Risk  (06/29/2022)   Overall Financial Resource Strain (CARDIA)    Difficulty of Paying Living Expenses: Not hard at all  Food Insecurity: No Food Insecurity (06/29/2022)   Hunger Vital Sign    Worried About Running Out of Food in the Last Year: Never true    Ran Out of Food in the Last Year: Never true  Transportation Needs: No Transportation Needs (06/29/2022)   PRAPARE - Administrator, Civil Service (Medical): No    Lack of Transportation (Non-Medical): No  Physical Activity: Sufficiently Active (06/29/2022)   Exercise Vital Sign    Days of Exercise per Week: 5 days    Minutes of Exercise per Session: 60 min  Stress: No Stress Concern Present (06/29/2022)   [employer] of Occupational Health - Occupational Stress Questionnaire    Feeling of Stress : Only a little  Social Connections: Socially Integrated (06/29/2022)   Social Connection and Isolation Panel [NHANES]    Frequency of Communication with Friends and Family: More than three times a week    Frequency of Social Gatherings with Friends and Family: More than three times a week    Attends Religious Services: More than 4 times per year    Active Member of [employer] or Organizations: Yes    Attends Banker Meetings: 1 to 4 times per year    Marital Status: Married     Family History: The patient's family history includes Alcohol abuse in her paternal grandfather; Aortic aneurysm in her maternal  grandmother; Arthritis in her mother; CAD in her father and paternal grandfather; COPD in her father; Cerebral palsy in her son; Dementia in her paternal grandmother; Hearing loss in her father; Heart attack in her paternal grandfather; Heart disease in her father; Hyperlipidemia in her mother; Hypertension in her father; Kidney nephrosis in her son. There is no history of Colon cancer, Esophageal cancer, Stomach cancer, Rectal cancer, Allergic rhinitis, Asthma, Eczema, or Urticaria.  ROS:  All other ROS reviewed and negative. Pertinent positives noted in the HPI.     EKGs/Labs/Other Studies Reviewed:   The following studies were personally reviewed by me today:  EKG:  EKG is ordered today.  The ekg ordered today demonstrates normal sinus rhythm heart 79, no acute ischemic changes or evidence of infarction, and was personally reviewed by me.   Recent Labs: 04/18/2022: ALT 26; BUN 12; Creatinine, Ser 0.69; Hemoglobin 12.5; Platelets 216.0; Potassium 3.9; Sodium 138; TSH 1.87   Recent Lipid Panel    Component Value Date/Time   CHOL 219 (H) 04/18/2022 0830   TRIG 48.0 04/18/2022 0830   HDL 77.50 04/18/2022 0830   CHOLHDL 3 04/18/2022 0830   VLDL 9.6 04/18/2022 0830   LDLCALC 132 (H) 04/18/2022 0830   LDLCALC 127 (H) 02/23/2021 1603    Physical Exam:   VS:  BP 126/74 (BP Location: Right Arm, Patient Position: Sitting, Cuff Size: Normal)   Pulse 79   Ht 5' 1.5" (1.562 m)   Wt 126 lb 12.8 oz (57.5 kg)   SpO2 97%   BMI 23.57 kg/m    Wt Readings from Last 3 Encounters:  08/04/22 126 lb 12.8 oz (57.5 kg)  06/29/22 127 lb (57.6 kg)  04/18/22 127 lb 3.2 oz (57.7 kg)    General: Well nourished, well developed, in no acute distress Head: Atraumatic, normal size  Eyes: PEERLA, EOMI  Neck: Supple, no JVD Endocrine: No thryomegaly Cardiac: Normal S1, S2; RRR; no murmurs, rubs, or gallops Lungs: Clear to auscultation bilaterally, no wheezing, rhonchi or rales  Abd: Soft, nontender, no  hepatomegaly  Ext: No edema, pulses 2+ Musculoskeletal: No deformities, BUE and BLE strength normal and equal Skin: Warm and dry, no rashes   Neuro: Alert and oriented to person, place, time, and situation, CNII-XII grossly intact, no focal deficits  Psych: Normal mood and affect   ASSESSMENT:   LAMETRA DEPALMA is a 67 y.o. female who presents for the following: 1. Agatston coronary artery calcium score less than 100   2. Mixed hyperlipidemia     PLAN:   1. Agatston coronary artery calcium score less than 100 2. Mixed hyperlipidemia -Coronary calcium score 80.  73rd percentile.  No symptoms of angina.  No stress testing needed.  EKG normal.  She maintains a high level of activity without any concerns for underlying obstructive CAD.  Do not believe she merits aspirin therapy at this time.  I think the more beneficial approach would be to reduce her LDL cholesterol.  I would recommend Lipitor 20 mg daily.  Goal LDL less than 100.  Of note, she has very high HDL cholesterol.  Value 77.  I think this should be considered.  I believe if she gets close to 100 this would be good enough.  If she develops any chest pains or trouble breathing she will reach out to [place].  We discussed healthy lifestyle and diet.      Disposition: Return if symptoms worsen or fail to improve.  Medication Adjustments/Labs and Tests Ordered: Current medicines are reviewed at length with the patient today.  Concerns regarding medicines are outlined above.  Orders Placed This Encounter  Procedures   EKG 12-Lead   Meds ordered this encounter  Medications   atorvastatin (LIPITOR) 20 MG tablet    Sig: Take 1 tablet (20 mg total) by mouth daily.    Dispense:  90 tablet    Refill:  3    Patient Instructions  Medication Instructions:  START Lipitor 20 mg daily   *If you need a refill on your cardiac medications before your next appointment, please call your pharmacy*   Follow-Up: At [facility], you  and your health needs are our priority.  As part of our continuing mission to provide you with exceptional heart care, we have created designated Provider Care Teams.  These Care Teams include your primary Cardiologist (physician) and Advanced Practice Providers (APPs -  Physician Assistants and Nurse Practitioners) who all work together to provide you with the care you need, when you need it.  We recommend signing up for the patient portal called "MyChart".  Sign up information is provided on this After Visit Summary.  MyChart is used to connect with patients for Virtual Visits (Telemedicine).  Patients are able to view lab/test results, encounter notes, upcoming appointments, etc.  Non-urgent messages can be sent to your provider as well.   To learn more about what you can do with MyChart, go to [email or web address].    Your next appointment:   As needed  Provider:   Lennie Odor, MD       Signed, Lenna Gilford. Flora Lipps, MD, [facility]  [facility]  [address], [address] [place], [place] [address] [phone]  08/04/2022 8:02 PM

## 2022-08-04 ENCOUNTER — Ambulatory Visit: Payer: Medicare HMO | Attending: Cardiovascular Disease | Admitting: Cardiovascular Disease

## 2022-08-04 ENCOUNTER — Encounter: Payer: Self-pay | Admitting: Cardiovascular Disease

## 2022-08-04 VITALS — BP 126/74 | HR 79 | Ht 61.5 in | Wt 126.8 lb

## 2022-08-04 DIAGNOSIS — R931 Abnormal findings on diagnostic imaging of heart and coronary circulation: Secondary | ICD-10-CM | POA: Diagnosis not present

## 2022-08-04 DIAGNOSIS — E782 Mixed hyperlipidemia: Secondary | ICD-10-CM

## 2022-08-04 MED ORDER — ATORVASTATIN CALCIUM 20 MG PO TABS: 20.0000 mg | ORAL_TABLET | Freq: Every day | ORAL | 3 refills | Status: DC

## 2022-08-04 NOTE — Patient Instructions (Signed)
Medication Instructions:  START Lipitor 20 mg daily   *If you need a refill on your cardiac medications before your next appointment, please call your pharmacy*   Follow-Up: At [facility], you and your health needs are our priority.  As part of our continuing mission to provide you with exceptional heart care, we have created designated Provider Care Teams.  These Care Teams include your primary Cardiologist (physician) and Advanced Practice Providers (APPs -  Physician Assistants and Nurse Practitioners) who all work together to provide you with the care you need, when you need it.  We recommend signing up for the patient portal called "MyChart".  Sign up information is provided on this After Visit Summary.  MyChart is used to connect with patients for Virtual Visits (Telemedicine).  Patients are able to view lab/test results, encounter notes, upcoming appointments, etc.  Non-urgent messages can be sent to your provider as well.   To learn more about what you can do with MyChart, go to [email or web address].    Your next appointment:   As needed  Provider:   Lennie Odor, MD

## 2022-08-05 DIAGNOSIS — R490 Dysphonia: Secondary | ICD-10-CM | POA: Diagnosis not present

## 2022-08-05 DIAGNOSIS — H6123 Impacted cerumen, bilateral: Secondary | ICD-10-CM | POA: Diagnosis not present

## 2022-08-12 DIAGNOSIS — N3946 Mixed incontinence: Secondary | ICD-10-CM | POA: Diagnosis not present

## 2022-08-12 DIAGNOSIS — R350 Frequency of micturition: Secondary | ICD-10-CM | POA: Diagnosis not present

## 2022-08-27 DIAGNOSIS — R69 Illness, unspecified: Secondary | ICD-10-CM | POA: Diagnosis not present

## 2022-09-06 DIAGNOSIS — R350 Frequency of micturition: Secondary | ICD-10-CM | POA: Diagnosis not present

## 2022-09-14 ENCOUNTER — Encounter: Payer: Self-pay | Admitting: Family Medicine

## 2022-09-15 ENCOUNTER — Encounter: Payer: Self-pay | Admitting: Family Medicine

## 2022-09-15 ENCOUNTER — Ambulatory Visit (INDEPENDENT_AMBULATORY_CARE_PROVIDER_SITE_OTHER): Payer: Medicare HMO | Admitting: Family Medicine

## 2022-09-15 VITALS — BP 122/84 | HR 71 | Temp 97.4°F | Wt 127.6 lb

## 2022-09-15 DIAGNOSIS — R051 Acute cough: Secondary | ICD-10-CM

## 2022-09-15 DIAGNOSIS — J3089 Other allergic rhinitis: Secondary | ICD-10-CM | POA: Diagnosis not present

## 2022-09-15 DIAGNOSIS — J302 Other seasonal allergic rhinitis: Secondary | ICD-10-CM

## 2022-09-15 MED ORDER — MONTELUKAST SODIUM 10 MG PO TABS: 10.0000 mg | ORAL_TABLET | Freq: Every day | ORAL | 2 refills | Status: DC

## 2022-09-15 MED ORDER — LEVOCETIRIZINE DIHYDROCHLORIDE 5 MG PO TABS: 5.0000 mg | ORAL_TABLET | Freq: Every evening | ORAL | 3 refills | Status: DC

## 2022-09-15 NOTE — Patient Instructions (Addendum)
Return for appt already scheduled.        Great to see you today.  I have refilled the medication(s) we provide.   If labs were collected, we will inform you of lab results once received either by echart message or telephone call.   - echart message- for normal results that have been seen by the patient already.   - telephone call: abnormal results or if patient has not viewed results in their echart.

## 2022-09-15 NOTE — Progress Notes (Signed)
Robin Banks , 1955-05-05, 67 y.o., female MRN: [id number] Patient Care Team    Relationship Specialty Notifications Start End  Natalia Leatherwood, DO PCP - General Family Medicine  10/26/15   Richardean Chimera, MD Consulting Physician Obstetrics and Gynecology  10/28/15   Nyoka Cowden, MD Consulting Physician Pulmonary Disease  10/28/15   Sherrilyn Rist, MD Consulting Physician Gastroenterology  02/23/21     Chief Complaint  Patient presents with   Cough    4 weeks     Subjective: Robin Banks is a 67 y.o. Pt presents for an OV with complaints of cough  of 4 weeks duration.  Associated symptoms include increased coughing at night and first thing in the morning.  Patient endorses postnasal drip at these times.  She denies any fevers or chills. Pt has tried Navage with prescribed steroid from ENT, Mucinex DM, Zyrtec, Singulair, Flonase to ease their symptoms.       09/15/2022    7:45 AM 06/29/2022    3:21 PM 04/18/2022    8:10 AM 10/06/2021    3:26 PM 07/22/2021    8:30 AM  Depression screen PHQ 2/9  Decreased Interest 1 0 0 0 1  Down, Depressed, Hopeless 0 0 1 1 1   PHQ - 2 Score 1 0 1 1 2   Altered sleeping 0   0 0  Tired, decreased energy 1   1 1   Change in appetite 0   0 0  Feeling bad or failure about yourself  0   0 0  Trouble concentrating 0   0 0  Moving slowly or fidgety/restless 0   0 0  Suicidal thoughts 0   0 0  PHQ-9 Score 2   2 3     Allergies  Allergen Reactions   Demerol [Meperidine Hcl] Nausea And Vomiting   Nitrofurantoin Nausea Only    swelling   Social History   Social History Narrative   Married to [place]). Three children Biagio Borg and Madelaine Bhat.    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    Takes herbal remedies, daily vitamin,    Wears seatbelt   Exercises 3x a week   Smoke detector in the home.    Firearms in the home (locked)   Feels safe in relationship.    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 Sherron Monday   CARPAL TUNNEL RELEASE Bilateral 2014   CATARACT EXTRACTION  12/2020   CATARACT EXTRACTION  01/2021   COLONOSCOPY     TUBAL LIGATION Bilateral 1978   Family History  Problem Relation Age of Onset   Hypertension Father    CAD Father    Hearing loss Father    Heart disease Father    COPD Father    Hyperlipidemia Mother  Arthritis Mother    Alcohol abuse Paternal Grandfather    Heart attack Paternal Grandfather    CAD Paternal Grandfather    Dementia Paternal Grandmother    Aortic aneurysm Maternal Grandmother    Cerebral palsy Son        Mild CP by records   Kidney nephrosis Son        FSG, kidney transplant.    Colon cancer Neg Hx    Esophageal cancer Neg Hx    Stomach cancer Neg Hx    Rectal cancer Neg Hx    Allergic rhinitis Neg Hx    Asthma Neg Hx    Eczema Neg Hx    Urticaria Neg Hx    Allergies as of 09/15/2022       Reactions   Demerol [meperidine Hcl] Nausea And Vomiting   Nitrofurantoin Nausea Only   swelling        Medication List        Accurate as of September 15, 2022 11:14 AM. If you have any questions, ask your nurse or doctor.          STOP taking these medications    BIOTIN PO Stopped by: Felix Pacini, DO   cetirizine 10 MG tablet Commonly known as: ZYRTEC Stopped by: Felix Pacini, DO   Fluzone High-Dose Quadrivalent 0.7 ML Susy Generic drug: Influenza Vac High-Dose Quad Stopped by: Felix Pacini, DO       TAKE  these medications    albuterol 108 (90 Base) MCG/ACT inhaler Commonly known as: Ventolin HFA Inhale 2 puffs into the lungs every 4 (four) hours as needed for wheezing or shortness of breath (coughing fits).   atorvastatin 20 MG tablet Commonly known as: LIPITOR Take 1 tablet (20 mg total) by mouth daily.   budesonide 0.5 MG/2ML nebulizer solution Commonly known as: PULMICORT Take by nebulization.   budesonide-formoterol 80-4.5 MCG/ACT inhaler Commonly known as: Symbicort Inhale 2 puffs into the lungs in the morning and at bedtime. with spacer and rinse mouth afterwards.   CALCIUM PLUS VITAMIN D3 PO Take by mouth daily.   fluticasone 50 MCG/ACT nasal spray Commonly known as: FLONASE Place into both nostrils.   GEMTESA PO Take by mouth.   ipratropium 0.06 % nasal spray Commonly known as: ATROVENT Place 2 sprays into both nostrils 4 (four) times daily.   levocetirizine 5 MG tablet Commonly known as: XYZAL Take 1 tablet (5 mg total) by mouth every evening. Started by: Felix Pacini, DO   montelukast 10 MG tablet Commonly known as: Singulair Take 1 tablet (10 mg total) by mouth at bedtime.   MULTIVITAMIN PO Take by mouth daily.   traZODone 150 MG tablet Commonly known as: DESYREL Take 1 tablet (150 mg total) by mouth at bedtime.        All past medical history, surgical history, allergies, family history, immunizations andmedications were updated in the EMR today and reviewed under the history and medication portions of their EMR.     ROS Negative, with the exception of above mentioned in HPI   Objective:  BP 122/84   Pulse 71   Temp (!) 97.4 F (36.3 C)   Wt 127 lb 9.6 oz (57.9 kg)   SpO2 97%   BMI 23.72 kg/m  Body mass index is 23.72 kg/m. Physical Exam Vitals and nursing note reviewed.  Constitutional:      General: She is not in acute distress.    Appearance: Normal appearance. She is normal weight. She is not ill-appearing  or toxic-appearing.   HENT:     Head: Normocephalic and atraumatic.     Right Ear: Tympanic membrane and ear canal normal. There is no impacted cerumen.     Left Ear: Tympanic membrane and ear canal normal. There is no impacted cerumen.     Nose: No congestion or rhinorrhea.     Comments: Postnasal drip present    Mouth/Throat:     Mouth: Mucous membranes are moist.     Pharynx: No oropharyngeal exudate or posterior oropharyngeal erythema.  Eyes:     General: No scleral icterus.       Right eye: No discharge.        Left eye: No discharge.     Extraocular Movements: Extraocular movements intact.     Conjunctiva/sclera: Conjunctivae normal.     Pupils: Pupils are equal, round, and reactive to light.  Cardiovascular:     Rate and Rhythm: Normal rate and regular rhythm.  Pulmonary:     Effort: Pulmonary effort is normal. No respiratory distress.     Breath sounds: Normal breath sounds. No wheezing, rhonchi or rales.  Musculoskeletal:     Cervical back: Neck supple. No tenderness.     Right lower leg: No edema.     Left lower leg: No edema.  Lymphadenopathy:     Cervical: No cervical adenopathy.  Skin:    Findings: No rash.  Neurological:     Mental Status: She is alert and oriented to person, place, and time. Mental status is at baseline.     Motor: No weakness.     Coordination: Coordination normal.     Gait: Gait normal.  Psychiatric:        Mood and Affect: Mood normal.        Behavior: Behavior normal.        Thought Content: Thought content normal.        Judgment: Judgment normal.      No results found. No results found. No results found for this or any previous visit (from the past 24 hour(s)).  Assessment/Plan: Robin Banks is a 67 y.o. female present for OV for  Acute cough/seasonal allergic rhinitis She does not appear infectious today.  Nasal passages appear very healthy today. Continue Singulair and Flonase. Call ENT for suggestions on the Navage/Pulmicort since it was  causing her pain. Stop Zyrtec, start Xyzal before bed. Use Atrovent nasal spray 4 times a day, especially before bed and first thing in the morning.  She had not been routinely using this nasal spray. So encouraged her to start an over-the-counter Prilosec for the next 2-4 weeks.  Her cough could be causing a recurrence of mild reflux, which would cause similar cough.  Reviewed expectations re: course of current medical issues. Discussed self-management of symptoms. Outlined signs and symptoms indicating need for more acute intervention. Patient verbalized understanding and all questions were answered. Patient received an After-Visit Summary.    No orders of the defined types were placed in this encounter.  Meds ordered this encounter  Medications   levocetirizine (XYZAL) 5 MG tablet    Sig: Take 1 tablet (5 mg total) by mouth every evening.    Dispense:  90 tablet    Refill:  3   montelukast (SINGULAIR) 10 MG tablet    Sig: Take 1 tablet (10 mg total) by mouth at bedtime.    Dispense:  90 tablet    Refill:  2   Referral Orders  No referral(s) requested today  Note is dictated utilizing voice recognition software. Although note has been proof read prior to signing, occasional typographical errors still can be missed. If any questions arise, please do not hesitate to call for verification.   electronically signed by:  Howard Pouch, DO  [place]

## 2022-09-16 DIAGNOSIS — N3946 Mixed incontinence: Secondary | ICD-10-CM | POA: Diagnosis not present

## 2022-09-19 ENCOUNTER — Other Ambulatory Visit: Payer: Self-pay | Admitting: Allergy

## 2022-09-29 ENCOUNTER — Encounter: Payer: Self-pay | Admitting: Family Medicine

## 2022-09-30 DIAGNOSIS — R338 Other retention of urine: Secondary | ICD-10-CM | POA: Diagnosis not present

## 2022-09-30 NOTE — Telephone Encounter (Deleted)
Message done in error. °

## 2022-10-03 ENCOUNTER — Encounter: Payer: Self-pay | Admitting: Family Medicine

## 2022-10-03 ENCOUNTER — Ambulatory Visit (INDEPENDENT_AMBULATORY_CARE_PROVIDER_SITE_OTHER): Payer: Medicare HMO | Admitting: Family Medicine

## 2022-10-03 VITALS — BP 115/82 | HR 77 | Temp 98.0°F | Wt 129.0 lb

## 2022-10-03 DIAGNOSIS — R052 Subacute cough: Secondary | ICD-10-CM | POA: Diagnosis not present

## 2022-10-03 MED ORDER — FLUTICASONE PROPIONATE 50 MCG/ACT NA SUSP: 2.0000 | Freq: Every day | NASAL | 11 refills | Status: DC

## 2022-10-03 MED ORDER — PREDNISONE 20 MG PO TABS: ORAL_TABLET | ORAL | 0 refills | Status: DC

## 2022-10-03 NOTE — Progress Notes (Signed)
Robin Banks , May 22, 1955, 67 y.o., female MRN: [id number] Patient Care Team    Relationship Specialty Notifications Start End  Natalia Leatherwood, DO PCP - General Family Medicine  10/26/15   Richardean Chimera, MD Consulting Physician Obstetrics and Gynecology  10/28/15   Nyoka Cowden, MD Consulting Physician Pulmonary Disease  10/28/15   Sherrilyn Rist, MD Consulting Physician Gastroenterology  02/23/21     Chief Complaint  Patient presents with   Cough    C/o mucus builds up in throat; denies any congestion at this time     Subjective: Robin Banks is a 67 y.o. Pt presents for an OV with complaints of cough  of >6 weeks duration.  Associated symptoms include increased coughing at night and first thing in the morning.  Patient endorses postnasal drip at these times.  She reports she is still having thick drainage in her throat and cough. She has restarted the nasal navage and is using the atrovent added last visit. She denies any fevers or chills. Pt has tried Mucinex DM, Zyrtec, Singulair, Flonase to ease their symptoms.      09/15/2022    7:45 AM 06/29/2022    3:21 PM 04/18/2022    8:10 AM 10/06/2021    3:26 PM 07/22/2021    8:30 AM  Depression screen PHQ 2/9  Decreased Interest 1 0 0 0 1  Down, Depressed, Hopeless 0 0 1 1 1   PHQ - 2 Score 1 0 1 1 2   Altered sleeping 0   0 0  Tired, decreased energy 1   1 1   Change in appetite 0   0 0  Feeling bad or failure about yourself  0   0 0  Trouble concentrating 0   0 0  Moving slowly or fidgety/restless 0   0 0  Suicidal thoughts 0   0 0  PHQ-9 Score 2   2 3     Allergies  Allergen Reactions   Demerol [Meperidine Hcl] Nausea And Vomiting   Nitrofurantoin Nausea Only    swelling   Social History   Social History Narrative   Married to [employer]). Three children Biagio Borg and Madelaine Bhat.    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    Takes herbal remedies, daily vitamin,    Wears seatbelt   Exercises 3x a week   Smoke  detector in the home.    Firearms in the home (locked)   Feels safe in relationship.    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 Sherron Monday   CARPAL TUNNEL RELEASE Bilateral 2014   CATARACT EXTRACTION  12/2020   CATARACT EXTRACTION  01/2021   COLONOSCOPY     TUBAL LIGATION Bilateral 1978   Family History  Problem Relation Age of Onset  Hypertension Father    CAD Father    Hearing loss Father    Heart disease Father    COPD Father    Hyperlipidemia Mother    Arthritis Mother    Alcohol abuse Paternal Grandfather    Heart attack Paternal Grandfather    CAD Paternal Grandfather    Dementia Paternal Grandmother    Aortic aneurysm Maternal Grandmother    Cerebral palsy Son        Mild CP by records   Kidney nephrosis Son        FSG, kidney transplant.    Colon cancer Neg Hx    Esophageal cancer Neg Hx    Stomach cancer Neg Hx    Rectal cancer Neg Hx    Allergic rhinitis Neg Hx    Asthma Neg Hx    Eczema Neg Hx    Urticaria Neg Hx    Allergies as of 10/03/2022       Reactions   Demerol [meperidine Hcl] Nausea And Vomiting   Nitrofurantoin Nausea Only   swelling        Medication List        Accurate as of October 03, 2022  8:05 AM. If you have any questions, ask your nurse or doctor.          albuterol 108 (90 Base) MCG/ACT inhaler Commonly known as: Ventolin HFA Inhale 2 puffs into the lungs every 4 (four) hours as  needed for wheezing or shortness of breath (coughing fits).   atorvastatin 20 MG tablet Commonly known as: LIPITOR Take 1 tablet (20 mg total) by mouth daily.   budesonide 0.5 MG/2ML nebulizer solution Commonly known as: PULMICORT Take by nebulization.   budesonide-formoterol 80-4.5 MCG/ACT inhaler Commonly known as: Symbicort Inhale 2 puffs into the lungs in the morning and at bedtime. with spacer and rinse mouth afterwards.   CALCIUM PLUS VITAMIN D3 PO Take by mouth daily.   fluticasone 50 MCG/ACT nasal spray Commonly known as: FLONASE Place 2 sprays into both nostrils daily. What changed:  how much to take when to take this Changed by: Felix Pacini, DO   GEMTESA PO Take by mouth.   ipratropium 0.06 % nasal spray Commonly known as: ATROVENT Place 2 sprays into both nostrils 4 (four) times daily.   levocetirizine 5 MG tablet Commonly known as: XYZAL Take 1 tablet (5 mg total) by mouth every evening.   montelukast 10 MG tablet Commonly known as: Singulair Take 1 tablet (10 mg total) by mouth at bedtime.   MULTIVITAMIN PO Take by mouth daily.   predniSONE 20 MG tablet Commonly known as: DELTASONE 60 mg x3d, 40 mg x3d, 20 mg x2d, 10 mg x2d Started by: Felix Pacini, DO   traZODone 150 MG tablet Commonly known as: DESYREL Take 1 tablet (150 mg total) by mouth at bedtime.        All past medical history, surgical history, allergies, family history, immunizations andmedications were updated in the EMR today and reviewed under the history and medication portions of their EMR.     ROS Negative, with the exception of above mentioned in HPI   Objective:  BP 115/82   Pulse 77   Temp 98 F (36.7 C)   Wt 129 lb (58.5 kg)   SpO2 97%   BMI 23.98 kg/m  Body mass index is 23.98 kg/m. Physical Exam Vitals and nursing note reviewed.  Constitutional:      General: She is not in acute distress.  Appearance: Normal appearance. She is normal weight. She is not  ill-appearing or toxic-appearing.  HENT:     Head: Normocephalic and atraumatic.     Right Ear: Tympanic membrane and ear canal normal. There is no impacted cerumen.     Left Ear: Tympanic membrane and ear canal normal. There is no impacted cerumen.     Nose: Rhinorrhea present. No congestion.     Comments: Postnasal drip present    Mouth/Throat:     Mouth: Mucous membranes are moist.     Pharynx: Posterior oropharyngeal erythema present. No oropharyngeal exudate.  Eyes:     General: No scleral icterus.       Right eye: No discharge.        Left eye: No discharge.     Extraocular Movements: Extraocular movements intact.     Conjunctiva/sclera: Conjunctivae normal.     Pupils: Pupils are equal, round, and reactive to light.  Cardiovascular:     Rate and Rhythm: Normal rate and regular rhythm.  Pulmonary:     Effort: Pulmonary effort is normal. No respiratory distress.     Breath sounds: Normal breath sounds. No wheezing, rhonchi or rales.     Comments: Cough present  Musculoskeletal:     Cervical back: Neck supple. No tenderness.     Right lower leg: No edema.     Left lower leg: No edema.  Lymphadenopathy:     Cervical: No cervical adenopathy.  Skin:    Findings: No rash.  Neurological:     Mental Status: She is alert and oriented to person, place, and time. Mental status is at baseline.     Motor: No weakness.     Coordination: Coordination normal.     Gait: Gait normal.  Psychiatric:        Mood and Affect: Mood normal.        Behavior: Behavior normal.        Thought Content: Thought content normal.        Judgment: Judgment normal.      No results found. No results found. No results found for this or any previous visit (from the past 24 hour(s)).  Assessment/Plan: Robin Banks is a 67 y.o. female present for OV for  Acute cough/seasonal allergic rhinitis She does not appear infectious today.  Nasal passages do have redness and rhinorrhea now.   ContinueXyzal before bed, flonase, singulair,  Atrovent nasal spray 4 times a day, especially before bed and first thing in the morning.   Continue Prilosec for the next 2-4 weeks.  Prednisone taper prescribed today.  If sx continue wold recommend ent follow up  Reviewed expectations re: course of current medical issues. Discussed self-management of symptoms. Outlined signs and symptoms indicating need for more acute intervention. Patient verbalized understanding and all questions were answered. Patient received an After-Visit Summary.    No orders of the defined types were placed in this encounter.  Meds ordered this encounter  Medications   predniSONE (DELTASONE) 20 MG tablet    Sig: 60 mg x3d, 40 mg x3d, 20 mg x2d, 10 mg x2d    Dispense:  18 tablet    Refill:  0   fluticasone (FLONASE) 50 MCG/ACT nasal spray    Sig: Place 2 sprays into both nostrils daily.    Dispense:  16 g    Refill:  11   Referral Orders  No referral(s) requested today     Note is dictated utilizing voice recognition software. Although note  has been proof read prior to signing, occasional typographical errors still can be missed. If any questions arise, please do not hesitate to call for verification.   electronically signed by:  Felix Pacini, DO  [facility] [place]

## 2022-10-03 NOTE — Patient Instructions (Addendum)
Return if symptoms worsen or fail to improve.        Great to see you today.  I have refilled the medication(s) we provide.   If labs were collected, we will inform you of lab results once received either by echart message or telephone call.   - echart message- for normal results that have been seen by the patient already.   - telephone call: abnormal results or if patient has not viewed results in their echart.  

## 2022-10-04 DIAGNOSIS — R339 Retention of urine, unspecified: Secondary | ICD-10-CM | POA: Diagnosis not present

## 2022-10-17 ENCOUNTER — Ambulatory Visit: Payer: Medicare HMO | Admitting: Family Medicine

## 2022-10-17 DIAGNOSIS — Z9889 Other specified postprocedural states: Secondary | ICD-10-CM | POA: Diagnosis not present

## 2022-10-17 DIAGNOSIS — R350 Frequency of micturition: Secondary | ICD-10-CM | POA: Diagnosis not present

## 2022-10-17 DIAGNOSIS — N3000 Acute cystitis without hematuria: Secondary | ICD-10-CM | POA: Diagnosis not present

## 2022-11-02 ENCOUNTER — Other Ambulatory Visit: Payer: Self-pay | Admitting: Family Medicine

## 2022-11-03 DIAGNOSIS — R339 Retention of urine, unspecified: Secondary | ICD-10-CM | POA: Diagnosis not present

## 2022-11-04 ENCOUNTER — Ambulatory Visit: Payer: Medicare HMO | Admitting: Family Medicine

## 2022-11-04 DIAGNOSIS — N390 Urinary tract infection, site not specified: Secondary | ICD-10-CM | POA: Diagnosis not present

## 2022-11-04 NOTE — Progress Notes (Deleted)
Patient ID: Robin Banks, female  DOB: 1955/08/03, 67 y.o.   MRN: [id number] Patient Care Team    Relationship Specialty Notifications Start End  Natalia Leatherwood, DO PCP - General Family Medicine  10/26/15   Richardean Chimera, MD Consulting Physician Obstetrics and Gynecology  10/28/15   Nyoka Cowden, MD Consulting Physician Pulmonary Disease  10/28/15   Sherrilyn Rist, MD Consulting Physician Gastroenterology  02/23/21     No chief complaint on file.   Subjective:  Robin Banks is a 67 y.o.  Female  present for Chronic Conditions/illness Management combination appt All past medical history, surgical history, allergies, family history, immunizations, medications and social history were updated in the electronic medical record today. All recent labs, ED visits and hospitalizations within the last year were reviewed.    Insomnia: Patient reported she is has been feeling good, she is doing "great."      09/15/2022    7:45 AM 06/29/2022    3:21 PM 04/18/2022    8:10 AM 10/06/2021    3:26 PM 07/22/2021    8:30 AM  Depression screen PHQ 2/9  Decreased Interest 1 0 0 0 1  Down, Depressed, Hopeless 0 0 1 1 1   PHQ - 2 Score 1 0 1 1 2   Altered sleeping 0   0 0  Tired, decreased energy 1   1 1   Change in appetite 0   0 0  Feeling bad or failure about yourself  0   0 0  Trouble concentrating 0   0 0  Moving slowly or fidgety/restless 0   0 0  Suicidal thoughts 0   0 0  PHQ-9 Score 2   2 3       04/18/2022    8:10 AM 10/06/2021    3:26 PM 07/22/2021    8:31 AM 09/23/2020    3:10 PM  GAD 7 : Generalized Anxiety Score  Nervous, Anxious, on Edge 0 0 0 1  Control/stop worrying 0 0 0 0  Worry too much - different things 0 0 0 0  Trouble relaxing 1 1 0 0  Restless 0 0 0 0  Easily annoyed or irritable 0 0 0 0  Afraid - awful might happen 0 0 0 0  Total GAD 7 Score 1 1 0 1   Immunization History  Administered Date(s) Administered   Fluad Quad(high Dose 65+) 12/05/2018, 01/10/2020,  12/26/2021   Influenza,inj,Quad PF,6+ Mos 03/24/2016, 01/24/2017, 12/04/2017   Influenza-Unspecified 12/26/2013, 01/24/2017, 12/26/2020   PFIZER(Purple Top)SARS-COV-2 Vaccination 06/02/2019, 07/02/2019, 01/28/2020   Pneumococcal Conjugate-13 02/03/2020   Pneumococcal Polysaccharide-23 11/20/2012, 02/23/2021   Td 04/29/2020   Tdap 02/03/2009, 04/30/2020   Zoster Recombinant(Shingrix) 03/09/2020, 06/15/2020   Zoster, Live 03/15/2013   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 Sherron Monday   CARPAL TUNNEL RELEASE Bilateral 2014   CATARACT EXTRACTION  12/2020   CATARACT EXTRACTION  01/2021   COLONOSCOPY     TUBAL LIGATION Bilateral 1978   Family History  Problem Relation Age of Onset   Hypertension Father    CAD Father    Hearing loss Father    Heart disease Father    COPD Father    Hyperlipidemia Mother    Arthritis Mother    Alcohol abuse Paternal Grandfather    Heart attack Paternal Grandfather    CAD Paternal Grandfather    Dementia Paternal Grandmother    Aortic aneurysm Maternal Grandmother    Cerebral palsy Son        Mild CP by records   Kidney nephrosis Son        FSG, kidney transplant.    Colon cancer Neg Hx    Esophageal cancer Neg Hx    Stomach cancer Neg Hx    Rectal cancer Neg Hx    Allergic  rhinitis Neg Hx    Asthma Neg Hx    Eczema Neg Hx    Urticaria Neg Hx    Social History   Social History Narrative   Married to [employer]). Three children Biagio Borg and Madelaine Bhat.    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    Takes herbal remedies, daily vitamin,    Wears seatbelt   Exercises 3x a week   Smoke detector in the home.    Firearms in the home (locked)   Feels safe in relationship.     Allergies as of 11/04/2022       Reactions   Demerol [meperidine Hcl] Nausea And Vomiting   Nitrofurantoin Nausea Only   swelling        Medication List        Accurate as of November 04, 2022  7:51 AM. If you have any questions, ask your nurse or doctor.          albuterol 108 (90 Base) MCG/ACT inhaler Commonly known as: Ventolin HFA Inhale 2 puffs into the lungs every 4 (four) hours as needed for wheezing or shortness of breath (coughing fits).   atorvastatin 20 MG tablet Commonly known as: LIPITOR Take 1 tablet (20 mg total) by mouth daily.   budesonide 0.5 MG/2ML nebulizer solution Commonly known as: PULMICORT Take by nebulization.   budesonide-formoterol 80-4.5 MCG/ACT inhaler Commonly known as: Symbicort Inhale 2 puffs into the lungs in the morning and at bedtime. with spacer and rinse mouth afterwards.   CALCIUM PLUS VITAMIN D3 PO Take by mouth daily.   fluticasone 50 MCG/ACT nasal spray Commonly known as: FLONASE Place 2 sprays into both nostrils daily.   GEMTESA PO Take by mouth.   ipratropium 0.06 % nasal spray Commonly known as: ATROVENT Place 2 sprays into both nostrils 4 (four) times daily.   levocetirizine 5 MG tablet Commonly known as: XYZAL Take 1 tablet (5 mg total) by mouth every evening.   montelukast 10 MG tablet Commonly known as: Singulair Take 1 tablet (10 mg total) by mouth at bedtime.   MULTIVITAMIN PO Take by mouth daily.   predniSONE 20 MG tablet Commonly known as: DELTASONE 60 mg x3d, 40 mg x3d, 20 mg x2d, 10 mg x2d    traZODone 150 MG tablet Commonly known as: DESYREL Take 1 tablet (150 mg total) by  mouth at bedtime.        All past medical history, surgical history, allergies, family history, immunizations andmedications were updated in the EMR today and reviewed under the history and medication portions of their EMR.     No results found for this or any previous visit (from the past 2160 hour(s)).    ROS: 14 pt review of systems performed and negative (unless mentioned in an HPI)  Objective: There were no vitals taken for this visit. Physical Exam Vitals and nursing note reviewed.  Constitutional:      General: She is not in acute distress.    Appearance: Normal appearance. She is normal weight. She is not ill-appearing or toxic-appearing.  HENT:     Head: Normocephalic and atraumatic.  Eyes:     General: No scleral icterus.       Right eye: No discharge.        Left eye: No discharge.     Extraocular Movements: Extraocular movements intact.     Conjunctiva/sclera: Conjunctivae normal.     Pupils: Pupils are equal, round, and reactive to light.  Skin:    Findings: No rash.  Neurological:     Mental Status: She is alert and oriented to person, place, and time. Mental status is at baseline.     Motor: No weakness.     Coordination: Coordination normal.     Gait: Gait normal.  Psychiatric:        Mood and Affect: Mood normal.        Behavior: Behavior normal.        Thought Content: Thought content normal.        Judgment: Judgment normal.      No results found.  Assessment/plan: JOBI BRAZEL is a 67 y.o. female present for CPE and chronic conditions management routine follow-up combination appointment Insomnia, unspecified type Stable Continue trazodone 150 mg nightly. -Patient would like to avoid any of the controlled substances including hypnotics. -Could consider gabapentin  -Tried: Trazodone, amitriptyline, Remeron - Follow-up every 5.5 months on current issues,  sooner if still not sleeping well.  Could consider a sleep specialist to rule out other sleep disorder.  Moderate persistent asthma without complication/allergic rhinitis/seasomal allergies Stable Continue inhalers and Singulair prescribed by [facility] Continue OTC oral antihistamine PRN    Elevated a1c 6.3 > 5.4> 5.5>5.8> A1c collected today - CBC with Differential/Platelet - Comprehensive metabolic panel - Hemoglobin A1c - Lipid panel Disorder of bone/osteopenia/vitamin D deficiency - Vitamin D collected today She supplements with 1500 mg calcium with vit d (unknown total dose) Dexa completed at gyn UTD  Breast cancer screening by mammogram Patient has her mammogram scheduled through gynecology Encounter for long-term current use of medication - CBC with Differential/Platelet - Lipid panel Seasonal and perennial allergic rhinitis Stable Continue Singulair. Continue albuterol as needed. Continue Symbicort 2 puffs twice daily. Continue OTC generic Zyrtec nightly. Routine general medical examination at a health care facility Colonoscopy: completed:05/23/2019- Dr. Myrtie Neither- 10 yr.  Mammogram: completed: mammogram and pelvic at GYN (Dr. Enid Cutter Comb). SBE routinely. Last mammogram 10/2020> she has mam scheduled.  Cervical cancer screening: see above, LMP at 47. Has GYN.  Immunizations: tdap UTD 2022, Influenza UTD 2023 (encouraged yearly), PNA completed series,  zostavax 2014, shingrix series completed 2022 Infectious disease : hep c completed  DEXA: 2021. -1.9 right femoral neck. Osteopenia rt hip, she takes calcium and vitamin D and weight bearing exercise (ordered by gyn) Patient was encouraged to exercise greater than  150 minutes a week. Patient was encouraged to choose a diet filled with fresh fruits and vegetables, and lean meats. AVS provided to patient today for education/recommendation on gender specific health and safety maintenance.  . No follow-ups on file.    No orders of  the defined types were placed in this encounter.  No orders of the defined types were placed in this encounter.   Referral Orders  No referral(s) requested today     Electronically signed by: Felix Pacini, DO [facility] [place]

## 2022-11-08 ENCOUNTER — Encounter: Payer: Self-pay | Admitting: Family Medicine

## 2022-11-08 ENCOUNTER — Ambulatory Visit (INDEPENDENT_AMBULATORY_CARE_PROVIDER_SITE_OTHER): Payer: Medicare HMO | Admitting: Family Medicine

## 2022-11-08 VITALS — BP 102/72 | HR 87 | Temp 97.5°F | Wt 128.0 lb

## 2022-11-08 DIAGNOSIS — M8589 Other specified disorders of bone density and structure, multiple sites: Secondary | ICD-10-CM | POA: Diagnosis not present

## 2022-11-08 DIAGNOSIS — F331 Major depressive disorder, recurrent, moderate: Secondary | ICD-10-CM

## 2022-11-08 DIAGNOSIS — J4540 Moderate persistent asthma, uncomplicated: Secondary | ICD-10-CM | POA: Diagnosis not present

## 2022-11-08 DIAGNOSIS — Z23 Encounter for immunization: Secondary | ICD-10-CM

## 2022-11-08 DIAGNOSIS — F5101 Primary insomnia: Secondary | ICD-10-CM

## 2022-11-08 MED ORDER — BUDESONIDE-FORMOTEROL FUMARATE 80-4.5 MCG/ACT IN AERO: 2.0000 | INHALATION_SPRAY | Freq: Two times a day (BID) | RESPIRATORY_TRACT | 5 refills | Status: DC

## 2022-11-08 MED ORDER — IPRATROPIUM BROMIDE 0.06 % NA SOLN: 2.0000 | Freq: Four times a day (QID) | NASAL | 12 refills | Status: DC

## 2022-11-08 MED ORDER — MONTELUKAST SODIUM 10 MG PO TABS: 10.0000 mg | ORAL_TABLET | Freq: Every day | ORAL | 2 refills | Status: DC

## 2022-11-08 MED ORDER — TRAZODONE HCL 150 MG PO TABS: 150.0000 mg | ORAL_TABLET | Freq: Every day | ORAL | 2 refills | Status: DC

## 2022-11-08 NOTE — Progress Notes (Addendum)
Patient ID: Robin Banks, female  DOB: 1956/03/02, 66 y.o.   MRN: [id number] Patient Care Team    Relationship Specialty Notifications Start End  Natalia Leatherwood, DO PCP - General Family Medicine  10/26/15   Richardean Chimera, MD Consulting Physician Obstetrics and Gynecology  10/28/15   Nyoka Cowden, MD Consulting Physician Pulmonary Disease  10/28/15   Sherrilyn Rist, MD Consulting Physician Gastroenterology  02/23/21     Chief Complaint  Patient presents with   Insomnia    Currently on 10 day Abx    Subjective: Robin Banks is a 67 y.o.  Female  present for Chronic Conditions/illness Management combination appt All past medical history, surgical history, allergies, family history, immunizations, medications and social history were updated in the electronic medical record today. All recent labs, ED visits and hospitalizations within the last year were reviewed.  Insomnia: Patient reported  she is doing "okay."  She is compliant with trazodone 150 mg daily.  She is sleeping better on this medication. Patient reports she is having reoccurring feelings of depression and not dealing with stress as well as she had been.  She is going through difficult time right now.  Her granddaughter was recently in a terrible accident in which she is still hospitalized secondary to spinal injury.  Asthma/allergies: Patient is compliant with Symbicort 1 puff twice daily.  Patient is compliant with Singulair, Flonase and Atrovent     11/08/2022    8:31 AM 09/15/2022    7:45 AM 06/29/2022    3:21 PM 04/18/2022    8:10 AM 10/06/2021    3:26 PM  Depression screen PHQ 2/9  Decreased Interest 1 1 0 0 0  Down, Depressed, Hopeless 1 0 0 1 1  PHQ - 2 Score 2 1 0 1 1  Altered sleeping 0 0   0  Tired, decreased energy 2 1   1   Change in appetite 0 0   0  Feeling bad or failure about yourself  0 0   0  Trouble concentrating 0 0   0  Moving slowly or fidgety/restless 0 0   0  Suicidal thoughts 0 0   0   PHQ-9 Score 4 2   2   Difficult doing work/chores Not difficult at all          11/08/2022    8:31 AM 04/18/2022    8:10 AM 10/06/2021    3:26 PM 07/22/2021    8:31 AM  GAD 7 : Generalized Anxiety Score  Nervous, Anxious, on Edge 3 0 0 0  Control/stop worrying  0 0 0  Worry too much - different things 2 0 0 0  Trouble relaxing 3 1 1  0  Restless 0 0 0 0  Easily annoyed or irritable 1 0 0 0  Afraid - awful might happen 0 0 0 0  Total GAD 7 Score  1 1 0  Anxiety Difficulty Not difficult at all      Immunization History  Administered Date(s) Administered   Fluad Quad(high Dose 65+) 12/05/2018, 01/10/2020, 12/26/2021, 11/08/2022   Influenza,inj,Quad PF,6+ Mos 03/24/2016, 01/24/2017, 12/04/2017   Influenza-Unspecified 12/26/2013, 01/24/2017, 12/26/2020   PFIZER(Purple Top)SARS-COV-2 Vaccination 06/02/2019, 07/02/2019, 01/28/2020   Pneumococcal Conjugate-13 02/03/2020   Pneumococcal Polysaccharide-23 11/20/2012, 02/23/2021   Td 04/29/2020   Tdap 02/03/2009, 04/30/2020   Zoster Recombinant(Shingrix) 03/09/2020, 06/15/2020   Zoster, Live 03/15/2013   Past Medical History:  Diagnosis Date   Allergy    Angio-edema  Asthma    Cataract    Chicken pox    Compression fracture of thoracic spine, non-traumatic (HCC)    T7-8   Depression    Excess ear wax    GERD (gastroesophageal reflux disease)    Hyperlipidemia    no meds taken   Insomnia    Menopause    Moderate recurrent major depression (HCC) 01/30/2017   Osteopenia    Overactive bladder    TMJ (temporomandibular joint syndrome)    Urinary incontinence    Voice hoarseness 01/25/2022   Allergies  Allergen Reactions   Demerol [Meperidine Hcl] Nausea And Vomiting   Nitrofurantoin Nausea Only    swelling   Past Surgical History:  Procedure Laterality Date   BLADDER SUSPENSION  2011   Dr. Sherron Monday   CARPAL TUNNEL RELEASE Bilateral 2014   CATARACT EXTRACTION  12/2020   CATARACT EXTRACTION  01/2021   COLONOSCOPY      TUBAL LIGATION Bilateral 1978   Family History  Problem Relation Age of Onset   Hypertension Father    CAD Father    Hearing loss Father    Heart disease Father    COPD Father    Hyperlipidemia Mother    Arthritis Mother    Alcohol abuse Paternal Grandfather    Heart attack Paternal Grandfather    CAD Paternal Grandfather    Dementia Paternal Grandmother    Aortic aneurysm Maternal Grandmother    Cerebral palsy Son        Mild CP by records   Kidney nephrosis Son        FSG, kidney transplant.    Colon cancer Neg Hx    Esophageal cancer Neg Hx    Stomach cancer Neg Hx    Rectal cancer Neg Hx    Allergic rhinitis Neg Hx    Asthma Neg Hx    Eczema Neg Hx    Urticaria Neg Hx    Social History   Social History Narrative   Married to Lawrence). Three children Biagio Borg and Madelaine Bhat.    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    Takes herbal remedies, daily vitamin,    Wears seatbelt   Exercises 3x a week   Smoke detector in the home.    Firearms in the home (locked)   Feels safe in relationship.     Allergies as of 11/08/2022       Reactions   Demerol [meperidine Hcl] Nausea And Vomiting   Nitrofurantoin Nausea Only   swelling        Medication List        Accurate as of November 08, 2022 11:54 AM. If you have any questions, ask your nurse or doctor.          STOP taking these medications    predniSONE 20 MG tablet Commonly known as: DELTASONE Stopped by: Felix Pacini       TAKE these medications    albuterol 108 (90 Base) MCG/ACT inhaler Commonly known as: Ventolin HFA Inhale 2 puffs into the lungs every 4 (four) hours as needed for wheezing or shortness of breath (coughing fits).   atorvastatin 20 MG tablet Commonly known as: LIPITOR Take 1 tablet (20 mg total) by mouth daily.   budesonide 0.5 MG/2ML nebulizer solution Commonly known as: PULMICORT Take by nebulization.   budesonide-formoterol 80-4.5 MCG/ACT inhaler Commonly known as:  Symbicort Inhale 2 puffs into the lungs in the morning and at bedtime. with spacer and rinse mouth afterwards.   CALCIUM PLUS  VITAMIN D3 PO Take by mouth daily.   fluticasone 50 MCG/ACT nasal spray Commonly known as: FLONASE Place 2 sprays into both nostrils daily.   GEMTESA PO Take by mouth.   ipratropium 0.06 % nasal spray Commonly known as: ATROVENT Place 2 sprays into both nostrils 4 (four) times daily.   levocetirizine 5 MG tablet Commonly known as: XYZAL Take 1 tablet (5 mg total) by mouth every evening.   montelukast 10 MG tablet Commonly known as: Singulair Take 1 tablet (10 mg total) by mouth at bedtime.   MULTIVITAMIN PO Take by mouth daily.   traZODone 150 MG tablet Commonly known as: DESYREL Take 1 tablet (150 mg total) by mouth at bedtime.        All past medical history, surgical history, allergies, family history, immunizations andmedications were updated in the EMR today and reviewed under the history and medication portions of their EMR.     No results found for this or any previous visit (from the past 2160 hour(s)).    ROS: 14 pt review of systems performed and negative (unless mentioned in an HPI)  Objective: BP 102/72   Pulse 87   Temp (!) 97.5 F (36.4 C)   Wt 128 lb (58.1 kg)   SpO2 99%   BMI 23.79 kg/m  Physical Exam Vitals and nursing note reviewed.  Constitutional:      General: She is not in acute distress.    Appearance: Normal appearance. She is normal weight. She is not ill-appearing or toxic-appearing.  HENT:     Head: Normocephalic and atraumatic.  Eyes:     General: No scleral icterus.       Right eye: No discharge.        Left eye: No discharge.     Extraocular Movements: Extraocular movements intact.     Conjunctiva/sclera: Conjunctivae normal.     Pupils: Pupils are equal, round, and reactive to light.  Skin:    Findings: No rash.  Neurological:     Mental Status: She is alert and oriented to person, place, and  time. Mental status is at baseline.     Motor: No weakness.     Coordination: Coordination normal.     Gait: Gait normal.  Psychiatric:        Mood and Affect: Mood normal. Affect is tearful.        Speech: Speech normal.        Behavior: Behavior normal. Behavior is cooperative.        Thought Content: Thought content normal.        Cognition and Memory: Cognition and memory normal.        Judgment: Judgment normal.     No results found.  Assessment/plan: Robin Banks is a 67 y.o. female present chronic conditions management  Insomnia, unspecified type Stable Continue trazodone 150 mg nightly. -Patient would like to avoid any of the controlled substances including hypnotics. -Could consider gabapentin  -Tried: Trazodone, amitriptyline, Remeron - Follow-up every 5.5 months on current issues, sooner if still not sleeping well.  Could consider a sleep specialist to rule out other sleep disorder.  Moderate persistent asthma without complication/allergic rhinitis/seasomal allergies stable Continue inhalers and Singulair  Continue OTC oral antihistamine PRN  Continue albuterol as needed. Continue Symbicort 2 puffs twice daily.  Disorder of bone/osteopenia/vitamin D deficiency - Vitamin D is up-to-date She supplements with 1500 mg calcium with vit d (unknown total dose) Dexa completed at gyn UTD  depression: Patient desires referral  to therapist/psychologist.  She does not feel if she is dealing with recent life changes well and is feeling more signs of depression. Referral placed for her  Influenza vaccine provided today.  Return in about 5 months (around 04/20/2023) for cpe (20 min), Routine chronic condition follow-up.    Orders Placed This Encounter  Procedures   Flu Vaccine QUAD High Dose(Fluad)   Ambulatory referral to Psychology   Meds ordered this encounter  Medications   budesonide-formoterol (SYMBICORT) 80-4.5 MCG/ACT inhaler    Sig: Inhale 2 puffs into  the lungs in the morning and at bedtime. with spacer and rinse mouth afterwards.    Dispense:  1 each    Refill:  5   ipratropium (ATROVENT) 0.06 % nasal spray    Sig: Place 2 sprays into both nostrils 4 (four) times daily.    Dispense:  15 mL    Refill:  12   montelukast (SINGULAIR) 10 MG tablet    Sig: Take 1 tablet (10 mg total) by mouth at bedtime.    Dispense:  90 tablet    Refill:  2   traZODone (DESYREL) 150 MG tablet    Sig: Take 1 tablet (150 mg total) by mouth at bedtime.    Dispense:  135 tablet    Refill:  2    Referral Orders         Ambulatory referral to Psychology       Electronically signed by: Felix Pacini, DO [facility] [place]

## 2022-11-08 NOTE — Patient Instructions (Addendum)
Return in about 5 months (around 04/20/2023) for cpe (20 min), Routine chronic condition follow-up.        Great to see you today.  I have refilled the medication(s) we provide.   If labs were collected or images ordered, we will inform you of  results once we have received them and reviewed. We will contact you either by echart message, or telephone call.  Please give ample time to the testing facility, and our office to run,  receive and review results. Please do not call inquiring of results, even if you can see them in your chart. We will contact you as soon as we are able. If it has been over 1 week since the test was completed, and you have not yet heard from [place], then please call us.    - echart message- for normal results that have been seen by the patient already.   - telephone call: abnormal results or if patient has not viewed results in their echart.  If a referral to a specialist was entered for you, please call us in 2 weeks if you have not heard from the specialist office to schedule.

## 2022-11-11 DIAGNOSIS — N3941 Urge incontinence: Secondary | ICD-10-CM | POA: Diagnosis not present

## 2022-11-11 DIAGNOSIS — R338 Other retention of urine: Secondary | ICD-10-CM | POA: Diagnosis not present

## 2022-11-23 NOTE — Addendum Note (Signed)
Addended by: Filomena Jungling on: 11/23/2022 11:28 AM   Modules accepted: Orders

## 2022-11-25 NOTE — Addendum Note (Signed)
Addended by: Filomena Jungling on: 11/25/2022 08:10 AM   Modules accepted: Orders

## 2022-11-29 ENCOUNTER — Encounter: Payer: Self-pay | Admitting: Family Medicine

## 2022-12-01 ENCOUNTER — Ambulatory Visit (INDEPENDENT_AMBULATORY_CARE_PROVIDER_SITE_OTHER): Payer: Medicare HMO | Admitting: Licensed Clinical Social Worker

## 2022-12-01 DIAGNOSIS — F331 Major depressive disorder, recurrent, moderate: Secondary | ICD-10-CM
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
## 2022-12-12 DIAGNOSIS — R339 Retention of urine, unspecified: Secondary | ICD-10-CM | POA: Diagnosis not present

## 2022-12-14 DIAGNOSIS — R350 Frequency of micturition: Secondary | ICD-10-CM | POA: Diagnosis not present

## 2022-12-22 ENCOUNTER — Ambulatory Visit: Payer: Medicare HMO | Admitting: Licensed Clinical Social Worker

## 2022-12-29 ENCOUNTER — Ambulatory Visit: Payer: Medicare HMO | Admitting: Licensed Clinical Social Worker

## 2022-12-29 ENCOUNTER — Encounter: Payer: Self-pay | Admitting: Family Medicine

## 2022-12-29 DIAGNOSIS — F331 Major depressive disorder, recurrent, moderate: Secondary | ICD-10-CM
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
## 2022-12-30 ENCOUNTER — Encounter: Payer: Self-pay | Admitting: Family Medicine

## 2022-12-30 ENCOUNTER — Ambulatory Visit (INDEPENDENT_AMBULATORY_CARE_PROVIDER_SITE_OTHER): Payer: Medicare HMO | Admitting: Family Medicine

## 2022-12-30 VITALS — BP 118/82 | HR 87 | Temp 98.2°F | Wt 131.0 lb

## 2022-12-30 DIAGNOSIS — J329 Chronic sinusitis, unspecified: Secondary | ICD-10-CM | POA: Diagnosis not present

## 2022-12-30 DIAGNOSIS — R051 Acute cough: Secondary | ICD-10-CM | POA: Diagnosis not present

## 2022-12-30 DIAGNOSIS — B9689 Other specified bacterial agents as the cause of diseases classified elsewhere: Secondary | ICD-10-CM | POA: Diagnosis not present

## 2022-12-30 MED ORDER — AMOXICILLIN-POT CLAVULANATE 875-125 MG PO TABS: 1.0000 | ORAL_TABLET | Freq: Two times a day (BID) | ORAL | 0 refills | Status: DC

## 2022-12-30 NOTE — Patient Instructions (Addendum)
Return in about 4 weeks (around 01/27/2023), or if symptoms worsen or fail to improve.        Great to see you today.  I have refilled the medication(s) we provide.   If labs were collected or images ordered, we will inform you of  results once we have received them and reviewed. We will contact you either by echart message, or telephone call.  Please give ample time to the testing facility, and our office to run,  receive and review results. Please do not call inquiring of results, even if you can see them in your chart. We will contact you as soon as we are able. If it has been over 1 week since the test was completed, and you have not yet heard from [place], then please call us.    - echart message- for normal results that have been seen by the patient already.   - telephone call: abnormal results or if patient has not viewed results in their echart.  If a referral to a specialist was entered for you, please call us in 2 weeks if you have not heard from the specialist office to schedule.

## 2022-12-30 NOTE — Progress Notes (Signed)
Robin Banks , 1955-08-29, 67 y.o., female MRN: [id number] Patient Care Team    Relationship Specialty Notifications Start End  Natalia Leatherwood, DO PCP - General Family Medicine  10/26/15   Richardean Chimera, MD Consulting Physician Obstetrics and Gynecology  10/28/15   Nyoka Cowden, MD Consulting Physician Pulmonary Disease  10/28/15   Sherrilyn Rist, MD Consulting Physician Gastroenterology  02/23/21     Chief Complaint  Patient presents with   Cough    3 weeks declines any other sx; last dose of abx for uti was tuesday     Subjective: Robin Banks is a 67 y.o. Pt presents for an OV with complaints of cough of 3 weeks duration.  Associated symptoms include increased phlegm production and mild sinus pressure mostly on the right maxillary sinus.  She is compliant with antihistamine, Singulair, Flonase and Atrovent nasal spray.  She uses the Pulmicort sinus rinses a few times a week.  She has been to see ENT.  She is taking over-the-counter omeprazole.  She has noticed hoarseness has returned with these current symptoms.     11/08/2022    8:31 AM 09/15/2022    7:45 AM 06/29/2022    3:21 PM 04/18/2022    8:10 AM 10/06/2021    3:26 PM  Depression screen PHQ 2/9  Decreased Interest 1 1 0 0 0  Down, Depressed, Hopeless 1 0 0 1 1  PHQ - 2 Score 2 1 0 1 1  Altered sleeping 0 0   0  Tired, decreased energy 2 1   1   Change in appetite 0 0   0  Feeling bad or failure about yourself  0 0   0  Trouble concentrating 0 0   0  Moving slowly or fidgety/restless 0 0   0  Suicidal thoughts 0 0   0  PHQ-9 Score 4 2   2   Difficult doing work/chores Not difficult at all        Allergies  Allergen Reactions   Demerol [Meperidine Hcl] Nausea And Vomiting   Nitrofurantoin Nausea Only    swelling   Social History   Social History Narrative   Married to [place]). Three children Biagio Borg and Madelaine Bhat.    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    Takes herbal remedies, daily vitamin,     Wears seatbelt   Exercises 3x a week   Smoke detector in the home.    Firearms in the home (locked)   Feels safe in relationship.    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 Sherron Monday   CARPAL TUNNEL RELEASE Bilateral 2014   CATARACT EXTRACTION  12/2020   CATARACT EXTRACTION  01/2021   COLONOSCOPY     TUBAL LIGATION Bilateral 1978  Family History  Problem Relation Age of Onset   Hypertension Father    CAD Father    Hearing loss Father    Heart disease Father    COPD Father    Hyperlipidemia Mother    Arthritis Mother    Alcohol abuse Paternal Grandfather    Heart attack Paternal Grandfather    CAD Paternal Grandfather    Dementia Paternal Grandmother    Aortic aneurysm Maternal Grandmother    Cerebral palsy Son        Mild CP by records   Kidney nephrosis Son        FSG, kidney transplant.    Colon cancer Neg Hx    Esophageal cancer Neg Hx    Stomach cancer Neg Hx    Rectal cancer Neg Hx    Allergic rhinitis Neg Hx    Asthma Neg Hx    Eczema Neg Hx    Urticaria Neg Hx    Allergies as of 12/30/2022       Reactions   Demerol [meperidine Hcl] Nausea And Vomiting   Nitrofurantoin Nausea Only   swelling        Medication List        Accurate as of December 30, 2022  2:51 PM. If you have any questions, ask your nurse or doctor.          albuterol 108 (90 Base) MCG/ACT inhaler Commonly known as: Ventolin  HFA Inhale 2 puffs into the lungs every 4 (four) hours as needed for wheezing or shortness of breath (coughing fits).   amoxicillin-clavulanate 875-125 MG tablet Commonly known as: AUGMENTIN Take 1 tablet by mouth 2 (two) times daily. Started by: Felix Pacini   atorvastatin 20 MG tablet Commonly known as: LIPITOR Take 1 tablet (20 mg total) by mouth daily.   budesonide 0.5 MG/2ML nebulizer solution Commonly known as: PULMICORT Take by nebulization.   budesonide-formoterol 80-4.5 MCG/ACT inhaler Commonly known as: Symbicort Inhale 2 puffs into the lungs in the morning and at bedtime. with spacer and rinse mouth afterwards.   CALCIUM PLUS VITAMIN D3 PO Take by mouth daily.   fluticasone 50 MCG/ACT nasal spray Commonly known as: FLONASE Place 2 sprays into both nostrils daily.   GEMTESA PO Take by mouth.   ipratropium 0.06 % nasal spray Commonly known as: ATROVENT Place 2 sprays into both nostrils 4 (four) times daily.   levocetirizine 5 MG tablet Commonly known as: XYZAL Take 1 tablet (5 mg total) by mouth every evening.   montelukast 10 MG tablet Commonly known as: Singulair Take 1 tablet (10 mg total) by mouth at bedtime.   MULTIVITAMIN PO Take by mouth daily.   traZODone 150 MG tablet Commonly known as: DESYREL Take 1 tablet (150 mg total) by mouth at bedtime.        All past medical history, surgical history, allergies, family history, immunizations andmedications were updated in the EMR today and reviewed under the history and medication portions of their EMR.     Review of Systems  Constitutional:  Positive for malaise/fatigue. Negative for chills and fever.  HENT:  Positive for congestion, sinus pain and sore throat. Negative for ear pain.   Respiratory:  Positive for cough, sputum production and shortness of breath.   Gastrointestinal:  Negative for diarrhea, nausea and vomiting.  Musculoskeletal:  Negative for myalgias.  Skin:  Negative for rash.   Neurological:  Negative for dizziness and headaches.   Negative, with the exception of above mentioned in HPI  Objective:  BP 118/82   Pulse 87   Temp 98.2 F (36.8 C)   Wt 131 lb (59.4 kg)   SpO2 96%   BMI 24.35 kg/m  Body mass index is 24.35 kg/m.  Physical Exam Vitals and nursing note reviewed.  Constitutional:      General: She is not in acute distress.    Appearance: Normal appearance. She is normal weight. She is not ill-appearing or toxic-appearing.  HENT:     Head: Normocephalic and atraumatic.     Right Ear: Tympanic membrane, ear canal and external ear normal.     Left Ear: Tympanic membrane, ear canal and external ear normal.     Nose: Congestion and rhinorrhea present.     Mouth/Throat:     Mouth: Mucous membranes are moist.     Pharynx: No oropharyngeal exudate or posterior oropharyngeal erythema.     Comments: Post nasal drip Eyes:     General: No scleral icterus.       Right eye: No discharge.        Left eye: No discharge.     Extraocular Movements: Extraocular movements intact.     Conjunctiva/sclera: Conjunctivae normal.     Pupils: Pupils are equal, round, and reactive to light.  Cardiovascular:     Rate and Rhythm: Normal rate and regular rhythm.  Pulmonary:     Effort: Pulmonary effort is normal. No respiratory distress.     Breath sounds: Normal breath sounds. No wheezing, rhonchi or rales.  Lymphadenopathy:     Cervical: No cervical adenopathy.  Skin:    Findings: No rash.  Neurological:     Mental Status: She is alert and oriented to person, place, and time. Mental status is at baseline.     Motor: No weakness.     Coordination: Coordination normal.     Gait: Gait normal.  Psychiatric:        Mood and Affect: Mood normal.        Behavior: Behavior normal.        Thought Content: Thought content normal.        Judgment: Judgment normal.      No results found. No results found. No results found for this or any previous visit  (from the past 24 hour(s)).  Assessment/Plan: KILYN MARAGH is a 67 y.o. female present for OV for   Bacterial sinusitis/Acute cough Rest, hydrate.  Continue Flonase and Atrovent Continue antihistamine and Singulair Continue nasal sinus rinses Augmentin twice daily prescribed, take until completed.  If cough present it can last up to 4 to 6 weeks We discussed further evaluation if she does not see a complete resolution within 4-6 weeks and the cough, hoarseness and/or sinus since she has had recurrent symptoms.  She did have evaluation by ENT who visualized vocal cords-and it was normal.  Continue omeprazole-may need to refer to GI for remains   Reviewed expectations re: course of current medical issues. Discussed self-management of symptoms. Outlined signs and symptoms indicating need for more acute intervention. Patient verbalized understanding and all questions were answered. Patient received an After-Visit Summary.    No orders of the defined types were placed in this encounter.  Meds ordered this encounter  Medications   amoxicillin-clavulanate (AUGMENTIN) 875-125 MG tablet    Sig: Take 1 tablet by mouth 2 (two) times daily.    Dispense:  20 tablet    Refill:  0   Referral Orders  No referral(s) requested today  Note is dictated utilizing voice recognition software. Although note has been proof read prior to signing, occasional typographical errors still can be missed. If any questions arise, please do not hesitate to call for verification.   electronically signed by:  Felix Pacini, DO  [facility] [place]

## 2023-01-11 ENCOUNTER — Ambulatory Visit: Payer: Medicare HMO | Admitting: Licensed Clinical Social Worker

## 2023-01-13 ENCOUNTER — Ambulatory Visit: Payer: Medicare HMO | Admitting: Family Medicine

## 2023-01-13 ENCOUNTER — Encounter: Payer: Self-pay | Admitting: Family Medicine

## 2023-01-13 VITALS — BP 118/82 | HR 88 | Temp 97.8°F | Wt 133.6 lb

## 2023-01-13 DIAGNOSIS — J4540 Moderate persistent asthma, uncomplicated: Secondary | ICD-10-CM | POA: Diagnosis not present

## 2023-01-13 MED ORDER — ALBUTEROL SULFATE HFA 108 (90 BASE) MCG/ACT IN AERS: 2.0000 | INHALATION_SPRAY | RESPIRATORY_TRACT | 1 refills | Status: DC | PRN

## 2023-01-13 MED ORDER — PREDNISONE 20 MG PO TABS: ORAL_TABLET | ORAL | 0 refills | Status: DC

## 2023-01-13 NOTE — Patient Instructions (Addendum)
Call in 4 weeks if cough still present. We will call in an increased dose of daily inhaler.   Return if symptoms worsen or fail to improve.        Great to see you today.  I have refilled the medication(s) we provide.   If labs were collected or images ordered, we will inform you of  results once we have received them and reviewed. We will contact you either by echart message, or telephone call.  Please give ample time to the testing facility, and our office to run,  receive and review results. Please do not call inquiring of results, even if you can see them in your chart. We will contact you as soon as we are able. If it has been over 1 week since the test was completed, and you have not yet heard from [place], then please call us.    - echart message- for normal results that have been seen by the patient already.   - telephone call: abnormal results or if patient has not viewed results in their echart.  If a referral to a specialist was entered for you, please call us in 2 weeks if you have not heard from the specialist office to schedule.

## 2023-01-13 NOTE — Progress Notes (Unsigned)
Robin Banks , 11-Aug-1955, 67 y.o., female MRN: [id number] Patient Care Team    Relationship Specialty Notifications Start End  Natalia Leatherwood, DO PCP - General Family Medicine  10/26/15   Richardean Chimera, MD Consulting Physician Obstetrics and Gynecology  10/28/15   Nyoka Cowden, MD Consulting Physician Pulmonary Disease  10/28/15   Sherrilyn Rist, MD Consulting Physician Gastroenterology  02/23/21     Chief Complaint  Patient presents with   Cough    Since 10/04; last Abx was tuesday     Subjective: Robin Banks is a 67 y.o. Pt presents for an OV with complaints of cough that is persistent, recently treated for sinus infection 10/4.  Finished antibiotics this past Tuesday.   Associated symptoms include increased phlegm production. She is compliant with antihistamine, Singulair, Flonase and Atrovent nasal spray.  She uses the Pulmicort sinus rinses a few times a week.  She has been to see ENT.  She is taking over-the-counter omeprazole.      11/08/2022    8:31 AM 09/15/2022    7:45 AM 06/29/2022    3:21 PM 04/18/2022    8:10 AM 10/06/2021    3:26 PM  Depression screen PHQ 2/9  Decreased Interest 1 1 0 0 0  Down, Depressed, Hopeless 1 0 0 1 1  PHQ - 2 Score 2 1 0 1 1  Altered sleeping 0 0   0  Tired, decreased energy 2 1   1   Change in appetite 0 0   0  Feeling bad or failure about yourself  0 0   0  Trouble concentrating 0 0   0  Moving slowly or fidgety/restless 0 0   0  Suicidal thoughts 0 0   0  PHQ-9 Score 4 2   2   Difficult doing work/chores Not difficult at all        Allergies  Allergen Reactions   Demerol [Meperidine Hcl] Nausea And Vomiting   Nitrofurantoin Nausea Only    swelling   Social History   Social History Narrative   Married to Robin Banks). Three children Robin Banks and Robin Banks.    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    Takes herbal remedies, daily vitamin,    Wears seatbelt   Exercises 3x a week   Smoke detector in the home.     Firearms in the home (locked)   Feels safe in relationship.    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 Sherron Monday   CARPAL TUNNEL RELEASE Bilateral 2014   CATARACT EXTRACTION  12/2020   CATARACT EXTRACTION  01/2021   COLONOSCOPY     TUBAL LIGATION Bilateral 1978   Family History  Problem Relation Age of Onset   Hypertension Father    CAD Father  Hearing loss Father    Heart disease Father    COPD Father    Hyperlipidemia Mother    Arthritis Mother    Alcohol abuse Paternal Grandfather    Heart attack Paternal Grandfather    CAD Paternal Grandfather    Dementia Paternal Grandmother    Aortic aneurysm Maternal Grandmother    Cerebral palsy Son        Mild CP by records   Kidney nephrosis Son        FSG, kidney transplant.    Colon cancer Neg Hx    Esophageal cancer Neg Hx    Stomach cancer Neg Hx    Rectal cancer Neg Hx    Allergic rhinitis Neg Hx    Asthma Neg Hx    Eczema Neg Hx    Urticaria Neg Hx    Allergies as of 01/13/2023       Reactions   Demerol [meperidine Hcl] Nausea And Vomiting   Nitrofurantoin Nausea Only   swelling        Medication List        Accurate as of January 13, 2023 11:59 PM. If you have any questions, ask your nurse or doctor.          STOP taking these medications    amoxicillin-clavulanate 875-125 MG tablet Commonly known as: AUGMENTIN Stopped by: Felix Pacini       TAKE  these medications    albuterol 108 (90 Base) MCG/ACT inhaler Commonly known as: Ventolin HFA Inhale 2 puffs into the lungs every 4 (four) hours as needed for wheezing or shortness of breath (coughing fits).   atorvastatin 20 MG tablet Commonly known as: LIPITOR Take 1 tablet (20 mg total) by mouth daily.   budesonide 0.5 MG/2ML nebulizer solution Commonly known as: PULMICORT Take by nebulization.   budesonide-formoterol 80-4.5 MCG/ACT inhaler Commonly known as: Symbicort Inhale 2 puffs into the lungs in the morning and at bedtime. with spacer and rinse mouth afterwards.   CALCIUM PLUS VITAMIN D3 PO Take by mouth daily.   fluticasone 50 MCG/ACT nasal spray Commonly known as: FLONASE Place 2 sprays into both nostrils daily.   GEMTESA PO Take by mouth.   ipratropium 0.06 % nasal spray Commonly known as: ATROVENT Place 2 sprays into both nostrils 4 (four) times daily.   levocetirizine 5 MG tablet Commonly known as: XYZAL Take 1 tablet (5 mg total) by mouth every evening.   montelukast 10 MG tablet Commonly known as: Singulair Take 1 tablet (10 mg total) by mouth at bedtime.   MULTIVITAMIN PO Take by mouth daily.   predniSONE 20 MG tablet Commonly known as: DELTASONE 60 mg x3d, 40 mg x3d, 20 mg x2d, 10 mg x2d Started by: Felix Pacini   traZODone 150 MG tablet Commonly known as: DESYREL Take 1 tablet (150 mg total) by mouth at bedtime.        All past medical history, surgical history, allergies, family history, immunizations andmedications were updated in the EMR today and reviewed under the history and medication portions of their EMR.     Review of Systems  Respiratory:  Positive for cough.    Negative, with the exception of above mentioned in HPI   Objective:  BP 118/82   Pulse 88   Temp 97.8 F (36.6 C)   Wt 133 lb 9.6 oz (60.6 kg)   SpO2 99%   BMI 24.83 kg/m  Body mass index is 24.83 kg/m. Physical Exam Vitals and nursing note reviewed.  Constitutional:      General: She is not in acute distress.    Appearance: Normal appearance. She is normal weight. She is not ill-appearing or toxic-appearing.  HENT:     Head: Normocephalic and atraumatic.     Right Ear: Tympanic membrane, ear canal and external ear normal.     Left Ear: Tympanic membrane, ear canal and external ear normal.     Nose: Congestion present. No rhinorrhea.     Comments: PND present    Mouth/Throat:     Mouth: Mucous membranes are moist.     Pharynx: No oropharyngeal exudate or posterior oropharyngeal erythema.     Comments: Post nasal drip Eyes:     General: No scleral icterus.       Right eye: No discharge.        Left eye: No discharge.     Extraocular Movements: Extraocular movements intact.     Conjunctiva/sclera: Conjunctivae normal.     Pupils: Pupils are equal, round, and reactive to light.  Cardiovascular:     Rate and Rhythm: Normal rate and regular rhythm.  Pulmonary:     Effort: Pulmonary effort is normal. No respiratory distress.     Breath sounds: Normal breath sounds. No wheezing, rhonchi or rales.  Lymphadenopathy:     Cervical: No cervical adenopathy.  Skin:    Findings: No rash.  Neurological:     Mental Status: She is alert and oriented to person, place, and time. Mental status is at baseline.     Motor: No weakness.     Coordination: Coordination normal.     Gait: Gait normal.  Psychiatric:        Mood and Affect: Mood normal.        Behavior: Behavior normal.        Thought Content: Thought content normal.        Judgment: Judgment normal.    No results found. No results found. No results found for this or any previous visit (from the past 24 hour(s)).  Assessment/Plan: Robin Banks is a 67 y.o. female present for OV for  Cough/asthma Rest, hydrate.  Continue Flonase and Atrovent Continue antihistamine and Singulair Continue nasal sinus rinses Albuterol every 4-6 hours for any wheezing or bronchospasm  cough. Prednisone taper prescribed We discussed further evaluation if she does not see a complete resolution  She did have evaluation by ENT who visualized vocal cords-and it was normal.  Continue omeprazole-may need to refer to GI for remains Symptoms seem more consistent with an asthma flare today.  Hopefully prednisone taper helps, I have asked her to call back in in 1-2 weeks and if she still having a cough, then consider increasing her Symbicort dose or changing to Advair.   Reviewed expectations re: course of current medical issues. Discussed self-management of symptoms. Outlined signs and symptoms indicating need for more acute intervention. Patient verbalized understanding and all questions were answered. Patient received an After-Visit Summary.    No orders of the defined types were placed in this encounter.  Meds ordered this encounter  Medications   albuterol (VENTOLIN HFA) 108 (90 Base) MCG/ACT inhaler    Sig: Inhale 2 puffs into the lungs every 4 (four) hours as needed for wheezing or shortness of breath (coughing fits).    Dispense:  18 g    Refill:  1   predniSONE (DELTASONE) 20 MG tablet    Sig: 60 mg x3d, 40 mg x3d, 20 mg x2d, 10 mg x2d  Dispense:  18 tablet    Refill:  0   Referral Orders  No referral(s) requested today     Note is dictated utilizing voice recognition software. Although note has been proof read prior to signing, occasional typographical errors still can be missed. If any questions arise, please do not hesitate to call for verification.   electronically signed by:  Felix Pacini, DO  [facility] - [place]

## 2023-01-17 ENCOUNTER — Ambulatory Visit (INDEPENDENT_AMBULATORY_CARE_PROVIDER_SITE_OTHER): Payer: Medicare HMO | Admitting: Licensed Clinical Social Worker

## 2023-01-17 DIAGNOSIS — F331 Major depressive disorder, recurrent, moderate: Secondary | ICD-10-CM

## 2023-01-17 NOTE — Progress Notes (Signed)
[facility] Counselor/Therapist Progress Note  Patient ID: Robin Banks, MRN: [id number]    Date: 01/17/23  Time Spent: 402  pm - 0510 pm : 68 Minutes  Treatment Type: Individual Therapy.  Reported Symptoms: Depression  Mental Status Exam: Appearance:  Meticulous     Behavior: Appropriate  Motor: Normal  Speech/Language:  Clear and Coherent  Affect: Appropriate  Mood: normal  Thought process: normal  Thought content:   WNL  Sensory/Perceptual disturbances:   WNL  Orientation: oriented to person, place, time/date, situation, day of week, month of year, and year  Attention: Good  Concentration: Good  Memory: WNL  Fund of knowledge:  Good  Insight:   Good  Judgment:  Good  Impulse Control: Good   Risk Assessment: Danger to Self:  No Self-injurious Behavior: No Danger to Others: No Duty to Warn:no Physical Aggression / Violence:No  Access to Firearms a concern: No  Gang Involvement:No   Subjective:   Robin Banks participated from office, located at [facility] with Clinician present. Robin Banks consented to treatment.   Patient presented for her session and identified that her mother had a fall and is in need of a caregiver. Robin Banks shared that this has been a stressor. Patient shared details of her life and relationships that have led up to this point in her life. Patient shared her goals for her future and how she struggles with so much hurt from her past. Clinician processed with patient the importance of taking care of herself and being kind to herself and extending grace to herself as she has others. Clinician and patient discussed resources to assist in the care of patients mother to assist in relieving that stressor.  Interventions: Cognitive Behavioral Therapy  Diagnosis: Major Depressive Disorder, recurrent, moderate   Plan: Robin Banks is to use CBT, mindfulness and coping skills to help manage decrease symptoms associated with their diagnosis.    Long-term goal:   Robin Banks will reduce overall level, frequency, and intensity of the feelings of depression evidenced by decreased irritability, negative self talk, and helpless feelings from 6 to 7 days/week to 0 to  2 days/week per client report for at least 3 consecutive months. Treatment plan to be reviewed by 12/01/2023.  Short-term goal:  Robin Banks will verbally express understanding of the relationship between feelings of depression, anxiety and their impact on thinking patterns and behaviors. Verbalize an understanding of the role that distorted thinking plays in creating fears, excessive worry, and ruminations.  Robin Banks MSW, LCSW DATE: 01/17/2023

## 2023-01-25 DIAGNOSIS — N3946 Mixed incontinence: Secondary | ICD-10-CM | POA: Diagnosis not present

## 2023-01-25 DIAGNOSIS — R350 Frequency of micturition: Secondary | ICD-10-CM | POA: Diagnosis not present

## 2023-01-25 DIAGNOSIS — R338 Other retention of urine: Secondary | ICD-10-CM | POA: Diagnosis not present

## 2023-01-31 ENCOUNTER — Encounter: Payer: Self-pay | Admitting: Family Medicine

## 2023-02-01 NOTE — Telephone Encounter (Signed)
FYI  Please see below

## 2023-02-02 ENCOUNTER — Ambulatory Visit: Payer: Medicare HMO | Admitting: Licensed Clinical Social Worker

## 2023-02-03 ENCOUNTER — Other Ambulatory Visit: Payer: Self-pay | Admitting: Family Medicine

## 2023-02-06 DIAGNOSIS — H47323 Drusen of optic disc, bilateral: Secondary | ICD-10-CM | POA: Diagnosis not present

## 2023-02-06 DIAGNOSIS — H5212 Myopia, left eye: Secondary | ICD-10-CM | POA: Diagnosis not present

## 2023-02-06 DIAGNOSIS — H53451 Other localized visual field defect, right eye: Secondary | ICD-10-CM | POA: Diagnosis not present

## 2023-02-06 DIAGNOSIS — Z0100 Encounter for examination of eyes and vision without abnormal findings: Secondary | ICD-10-CM | POA: Diagnosis not present

## 2023-02-06 DIAGNOSIS — Z961 Presence of intraocular lens: Secondary | ICD-10-CM | POA: Diagnosis not present

## 2023-02-19 DIAGNOSIS — N3001 Acute cystitis with hematuria: Secondary | ICD-10-CM | POA: Diagnosis not present

## 2023-03-03 DIAGNOSIS — R8271 Bacteriuria: Secondary | ICD-10-CM | POA: Diagnosis not present

## 2023-03-03 DIAGNOSIS — R338 Other retention of urine: Secondary | ICD-10-CM | POA: Diagnosis not present

## 2023-03-03 DIAGNOSIS — N3941 Urge incontinence: Secondary | ICD-10-CM | POA: Diagnosis not present

## 2023-03-31 DIAGNOSIS — N3941 Urge incontinence: Secondary | ICD-10-CM | POA: Diagnosis not present

## 2023-03-31 DIAGNOSIS — R338 Other retention of urine: Secondary | ICD-10-CM | POA: Diagnosis not present

## 2023-03-31 DIAGNOSIS — R350 Frequency of micturition: Secondary | ICD-10-CM | POA: Diagnosis not present

## 2023-04-28 ENCOUNTER — Ambulatory Visit: Payer: Medicare HMO | Admitting: Family Medicine

## 2023-04-28 ENCOUNTER — Encounter: Payer: Self-pay | Admitting: Family Medicine

## 2023-04-28 VITALS — BP 102/70 | HR 85 | Temp 97.6°F | Ht 62.21 in | Wt 130.6 lb

## 2023-04-28 DIAGNOSIS — R7309 Other abnormal glucose: Secondary | ICD-10-CM | POA: Diagnosis not present

## 2023-04-28 DIAGNOSIS — F331 Major depressive disorder, recurrent, moderate: Secondary | ICD-10-CM

## 2023-04-28 DIAGNOSIS — R053 Chronic cough: Secondary | ICD-10-CM | POA: Diagnosis not present

## 2023-04-28 DIAGNOSIS — J302 Other seasonal allergic rhinitis: Secondary | ICD-10-CM

## 2023-04-28 DIAGNOSIS — M8589 Other specified disorders of bone density and structure, multiple sites: Secondary | ICD-10-CM | POA: Diagnosis not present

## 2023-04-28 DIAGNOSIS — J3089 Other allergic rhinitis: Secondary | ICD-10-CM

## 2023-04-28 DIAGNOSIS — F5101 Primary insomnia: Secondary | ICD-10-CM | POA: Diagnosis not present

## 2023-04-28 DIAGNOSIS — Z1231 Encounter for screening mammogram for malignant neoplasm of breast: Secondary | ICD-10-CM

## 2023-04-28 DIAGNOSIS — Z13220 Encounter for screening for lipoid disorders: Secondary | ICD-10-CM | POA: Diagnosis not present

## 2023-04-28 DIAGNOSIS — Z0000 Encounter for general adult medical examination without abnormal findings: Secondary | ICD-10-CM | POA: Diagnosis not present

## 2023-04-28 LAB — COMPREHENSIVE METABOLIC PANEL
ALT: 25 U/L (ref 0–35)
AST: 24 U/L (ref 0–37)
Albumin: 4.1 g/dL (ref 3.5–5.2)
Alkaline Phosphatase: 72 U/L (ref 39–117)
BUN: 15 mg/dL (ref 6–23)
CO2: 32 meq/L (ref 19–32)
Calcium: 9.3 mg/dL (ref 8.4–10.5)
Chloride: 101 meq/L (ref 96–112)
Creatinine, Ser: 0.77 mg/dL (ref 0.40–1.20)
GFR: 78.63 mL/min (ref >60.00)
Glucose, Bld: 60 mg/dL — ABNORMAL LOW (ref 70–99)
Potassium: 4.1 meq/L (ref 3.5–5.1)
Sodium: 139 meq/L (ref 135–145)
Total Bilirubin: 0.5 mg/dL (ref 0.2–1.2)
Total Protein: 6.4 g/dL (ref 6.0–8.3)

## 2023-04-28 LAB — CBC
HCT: 39.5 % (ref 36.0–46.0)
Hemoglobin: 13 g/dL (ref 12.0–15.0)
MCHC: 33 g/dL (ref 30.0–36.0)
MCV: 96.2 fL (ref 78.0–100.0)
Platelets: 214 10*3/uL (ref 150.0–400.0)
RBC: 4.1 Mil/uL (ref 3.87–5.11)
RDW: 13.8 % (ref 11.5–15.5)
WBC: 5.4 10*3/uL (ref 4.0–10.5)

## 2023-04-28 LAB — LIPID PANEL
Cholesterol: 166 mg/dL (ref 0–200)
HDL: 78.3 mg/dL (ref >39.00)
LDL Cholesterol: 76 mg/dL (ref 0–99)
NonHDL: 87.78
Total CHOL/HDL Ratio: 2
Triglycerides: 60 mg/dL (ref 0.0–149.0)
VLDL: 12 mg/dL (ref 0.0–40.0)

## 2023-04-28 LAB — HEMOGLOBIN A1C: Hgb A1c MFr Bld: 6.2 % (ref 4.6–6.5)

## 2023-04-28 LAB — VITAMIN D 25 HYDROXY (VIT D DEFICIENCY, FRACTURES): VITD: 38.99 ng/mL (ref 30.00–100.00)

## 2023-04-28 LAB — TSH: TSH: 1.84 u[IU]/mL (ref 0.35–5.50)

## 2023-04-28 MED ORDER — BUDESONIDE-FORMOTEROL FUMARATE 80-4.5 MCG/ACT IN AERO: 2.0000 | INHALATION_SPRAY | Freq: Two times a day (BID) | RESPIRATORY_TRACT | 5 refills | Status: DC

## 2023-04-28 MED ORDER — MONTELUKAST SODIUM 10 MG PO TABS: 10.0000 mg | ORAL_TABLET | Freq: Every day | ORAL | 2 refills | Status: DC

## 2023-04-28 MED ORDER — FLUTICASONE PROPIONATE 50 MCG/ACT NA SUSP: 2.0000 | Freq: Every day | NASAL | 11 refills | Status: AC

## 2023-04-28 MED ORDER — LEVOCETIRIZINE DIHYDROCHLORIDE 5 MG PO TABS: 5.0000 mg | ORAL_TABLET | Freq: Every evening | ORAL | 3 refills | Status: AC

## 2023-04-28 MED ORDER — IPRATROPIUM BROMIDE 0.06 % NA SOLN: 2.0000 | Freq: Four times a day (QID) | NASAL | 12 refills | Status: DC

## 2023-04-28 MED ORDER — TRAZODONE HCL 150 MG PO TABS: 150.0000 mg | ORAL_TABLET | Freq: Every day | ORAL | 1 refills | Status: DC

## 2023-04-28 NOTE — Progress Notes (Signed)
Patient ID: Robin Banks, female  DOB: Jul 29, 1955, 67 y.o.   MRN: [id number] Patient Care Team    Relationship Specialty Notifications Start End  Natalia Leatherwood, DO PCP - General Family Medicine  10/26/15   Richardean Chimera, MD Consulting Physician Obstetrics and Gynecology  10/28/15   Nyoka Cowden, MD Consulting Physician Pulmonary Disease  10/28/15   Sherrilyn Rist, MD Consulting Physician Gastroenterology  02/23/21     Chief Complaint  Patient presents with   Annual Exam    Chronic Conditions/illness Management Pt is not fasting    Subjective: Robin Banks is a 68 y.o.  Female  present for CPE and Chronic Conditions/illness Management combination appt All past medical history, surgical history, allergies, family history, immunizations, medications and social history were updated in the electronic medical record today. All recent labs, ED visits and hospitalizations within the last year were reviewed.  Health maintenance:  Colonoscopy: completed:05/23/2019- Dr. Myrtie Neither- 10 yr.  Mammogram: completed: 05/10/2022 mammogram and pelvic at GYN (Dr. Enid Cutter Comb). SBE routinely. Last mammogram 10/2020> she has mam scheduled.  Cervical cancer screening: see above, LMP at 47. Has GYN.  Immunizations: tdap UTD 2022, Influenza UTD 2024 (encouraged yearly), PNA completed series,  zostavax 2014, shingrix series completed 2022 Infectious disease : hep c completed  DEXA: 2024.  Osteopenia rt hip, she takes calcium and vitamin D and weight bearing exercise (ordered by gyn) Patient has a Dental home. Hospitalizations/ED visits: reviewed  Insomnia: Patient reported she is has been feeling good,  she is doing "great."  Persistent cough: Patient reports her cough again returned despite multiple different antihistamines, Singulair, nasal sprays, reflux medicines.  She is agreeable to ENT referral for evaluation.     04/28/2023    9:21 AM 11/08/2022    8:31 AM 09/15/2022    7:45 AM 06/29/2022     3:21 PM 04/18/2022    8:10 AM  Depression screen PHQ 2/9  Decreased Interest 0 1 1 0 0  Down, Depressed, Hopeless 1 1 0 0 1  PHQ - 2 Score 1 2 1  0 1  Altered sleeping 0 0 0    Tired, decreased energy 1 2 1     Change in appetite 0 0 0    Feeling bad or failure about yourself  0 0 0    Trouble concentrating 0 0 0    Moving slowly or fidgety/restless 0 0 0    Suicidal thoughts 0 0 0    PHQ-9 Score 2 4 2     Difficult doing work/chores Not difficult at all Not difficult at all         04/28/2023    9:21 AM 11/08/2022    8:31 AM 04/18/2022    8:10 AM 10/06/2021    3:26 PM  GAD 7 : Generalized Anxiety Score  Nervous, Anxious, on Edge 3 3 0 0  Control/stop worrying 0  0 0  Worry too much - different things 0 2 0 0  Trouble relaxing 2 3 1 1   Restless 0 0 0 0  Easily annoyed or irritable 1 1 0 0  Afraid - awful might happen 0 0 0 0  Total GAD 7 Score 6  1 1   Anxiety Difficulty Not difficult at all Not difficult at all     Immunization History  Administered Date(s) Administered   Fluad Quad(high Dose 65+) 12/05/2018, 01/10/2020, 12/26/2021   Fluad Trivalent(High Dose 65+) 11/08/2022   Influenza,inj,Quad PF,6+ Mos 03/24/2016, 01/24/2017,  12/04/2017   Influenza-Unspecified 12/26/2013, 01/24/2017, 12/26/2020   PFIZER(Purple Top)SARS-COV-2 Vaccination 06/02/2019, 07/02/2019, 01/28/2020   Pneumococcal Conjugate-13 02/03/2020   Pneumococcal Polysaccharide-23 11/20/2012, 02/23/2021   Td 04/29/2020   Tdap 02/03/2009, 04/30/2020   Zoster Recombinant(Shingrix) 03/09/2020, 06/15/2020   Zoster, Live 03/15/2013   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 Sherron Monday   CARPAL TUNNEL RELEASE Bilateral 2014   CATARACT EXTRACTION  12/2020   CATARACT EXTRACTION  01/2021   COLONOSCOPY     TUBAL LIGATION Bilateral 1978   Family History  Problem Relation Age of Onset   Hypertension Father    CAD Father    Hearing loss Father    Heart disease Father    COPD Father    Hyperlipidemia Mother    Arthritis Mother    Alcohol abuse Paternal Grandfather    Heart attack Paternal Grandfather    CAD Paternal Grandfather    Dementia Paternal Grandmother    Aortic aneurysm Maternal Grandmother    Cerebral palsy Son        Mild CP by records   Kidney nephrosis Son        FSG, kidney transplant.    Colon cancer Neg Hx    Esophageal cancer Neg Hx    Stomach cancer Neg Hx    Rectal cancer Neg Hx    Allergic rhinitis Neg Hx    Asthma Neg Hx    Eczema Neg Hx    Urticaria Neg Hx    Social History   Social History Narrative   Married to [place]). Three children Biagio Borg and Madelaine Bhat.    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    Takes herbal remedies, daily vitamin,    Wears seatbelt   Exercises 3x a week   Smoke detector in the home.    Firearms in the home (locked)   Feels safe in relationship.     Allergies as of 04/28/2023       Reactions   Demerol [meperidine Hcl] Nausea And Vomiting   Nitrofurantoin Nausea Only   swelling        Medication List        Accurate as of April 28, 2023 11:18 AM. If you have any questions, ask your nurse or doctor.          STOP taking these medications    budesonide 0.5 MG/2ML nebulizer solution Commonly known as: PULMICORT Stopped by: Felix Pacini   predniSONE 20  MG tablet Commonly known as: DELTASONE Stopped by: Felix Pacini       TAKE these medications    albuterol 108 (90 Base) MCG/ACT inhaler Commonly known as: Ventolin HFA Inhale 2 puffs into the lungs  every 4 (four) hours as needed for wheezing or shortness of breath (coughing fits).   atorvastatin 20 MG tablet Commonly known as: LIPITOR Take 1 tablet (20 mg total) by mouth daily.   BIOTIN PO Take by mouth.   budesonide-formoterol 80-4.5 MCG/ACT inhaler Commonly known as: Symbicort Inhale 2 puffs into the lungs in the morning and at bedtime. with spacer and rinse mouth afterwards.   CALCIUM PLUS VITAMIN D3 PO Take by mouth daily.   cephALEXin 250 MG capsule Commonly known as: KEFLEX Take 250 mg by mouth daily.   fluticasone 50 MCG/ACT nasal spray Commonly known as: FLONASE Place 2 sprays into both nostrils daily.   GEMTESA PO Take by mouth.   ipratropium 0.06 % nasal spray Commonly known as: ATROVENT Place 2 sprays into both nostrils 4 (four) times daily.   levocetirizine 5 MG tablet Commonly known as: XYZAL Take 1 tablet (5 mg total) by mouth every evening.   MAGNESIUM PO Take by mouth.   montelukast 10 MG tablet Commonly known as: Singulair Take 1 tablet (10 mg total) by mouth at bedtime.   MULTIVITAMIN PO Take by mouth daily.   traZODone 150 MG tablet Commonly known as: DESYREL Take 1 tablet (150 mg total) by mouth at bedtime.        All past medical history, surgical history, allergies, family history, immunizations andmedications were updated in the EMR today and reviewed under the history and medication portions of their EMR.     No results found for this or any previous visit (from the past 2160 hours).    ROS: 14 pt review of systems performed and negative (unless mentioned in an HPI)  Objective: BP 102/70   Pulse 85   Temp 97.6 F (36.4 C)   Ht 5' 2.21" (1.58 m)   Wt 130 lb 9.6 oz (59.2 kg)   SpO2 98%   BMI 23.73 kg/m  Physical  Exam Vitals and nursing note reviewed.  Constitutional:      General: She is not in acute distress.    Appearance: Normal appearance. She is not ill-appearing or toxic-appearing.  HENT:     Head: Normocephalic and atraumatic.     Right Ear: Tympanic membrane, ear canal and external ear normal. There is no impacted cerumen.     Left Ear: Tympanic membrane, ear canal and external ear normal. There is no impacted cerumen.     Nose: No congestion or rhinorrhea.     Mouth/Throat:     Mouth: Mucous membranes are moist.     Pharynx: Oropharynx is clear. No oropharyngeal exudate or posterior oropharyngeal erythema.  Eyes:     General: No scleral icterus.       Right eye: No discharge.        Left eye: No discharge.     Extraocular Movements: Extraocular movements intact.     Conjunctiva/sclera: Conjunctivae normal.     Pupils: Pupils are equal, round, and reactive to light.  Cardiovascular:     Rate and Rhythm: Normal rate and regular rhythm.     Pulses: Normal pulses.     Heart sounds: Normal heart sounds. No murmur heard.    No friction rub. No gallop.  Pulmonary:     Effort: Pulmonary effort is normal. No respiratory distress.     Breath sounds: Normal breath sounds. No stridor. No wheezing, rhonchi or rales.  Chest:     Chest wall: No tenderness.  Abdominal:     General: Abdomen is flat. Bowel sounds  are normal. There is no distension.     Palpations: Abdomen is soft. There is no mass.     Tenderness: There is no abdominal tenderness. There is no right CVA tenderness, left CVA tenderness, guarding or rebound.     Hernia: No hernia is present.  Musculoskeletal:        General: No swelling, tenderness or deformity. Normal range of motion.     Cervical back: Normal range of motion and neck supple. No rigidity or tenderness.     Right lower leg: No edema.     Left lower leg: No edema.  Lymphadenopathy:     Cervical: No cervical adenopathy.  Skin:    General: Skin is warm and dry.      Coloration: Skin is not jaundiced or pale.     Findings: No bruising, erythema, lesion or rash.  Neurological:     General: No focal deficit present.     Mental Status: She is alert and oriented to person, place, and time. Mental status is at baseline.     Cranial Nerves: No cranial nerve deficit.     Sensory: No sensory deficit.     Motor: No weakness.     Coordination: Coordination normal.     Gait: Gait normal.     Deep Tendon Reflexes: Reflexes normal.  Psychiatric:        Mood and Affect: Mood normal.        Behavior: Behavior normal.        Thought Content: Thought content normal.        Judgment: Judgment normal.      No results found.  Assessment/plan: Robin Banks is a 68 y.o. female present for CPE and chronic conditions management routine follow-up combination appointment Insomnia, unspecified type Stable Continue trazodone 150 mg nightly. -Patient would like to avoid any of the controlled substances including hypnotics. -Could consider gabapentin  -Tried: Trazodone, amitriptyline, Remeron - Follow-up every 5.5 months on current issues, sooner if still not sleeping well.  Could consider a sleep specialist to rule out other sleep disorder.  Moderate persistent asthma without complication/allergic rhinitis/seasonal allergies Stable Continue inhalers and Singulair  Continue OTC oral antihistamine PRN  Continue Flonase and Atrovent nasal spray Continue Symbicort 2 puffs twice daily Continue albuterol as needed  Elevated a1c 6.3 > 5.4> 5.5>5.8>6.0>A1c collected today  Persistent cough: Patient has had persistent cough despite reflux, allergy and asthma regimen increase.  We had discussed ENT referral as neck step and she is okay with [place] placing referral for her today.  Disorder of bone/osteopenia/vitamin D deficiency - Vitamin D collected today She supplements with 1500 mg calcium with vit d (unknown total dose) Dexa completed at gyn UTD 2024, requested  record  Routine general medical examination at a health care facility Patient was encouraged to exercise greater than 150 minutes a week. Patient was encouraged to choose a diet filled with fresh fruits and vegetables, and lean meats. AVS provided to patient today for education/recommendation on gender specific health and safety maintenance. Colonoscopy: completed:05/23/2019- Dr. Myrtie Neither- 10 yr.  Mammogram: completed: 05/10/2022 mammogram and pelvic at GYN (Dr. Enid Cutter Comb). SBE routinely. Last mammogram 10/2020> she has mam scheduled.  Cervical cancer screening: see above, LMP at 47. Has GYN.  Immunizations: tdap UTD 2022, Influenza UTD 2024 (encouraged yearly), PNA completed series,  zostavax 2014, shingrix series completed 2022 Infectious disease : hep c completed  DEXA: 2024.  Osteopenia rt hip, she takes calcium and vitamin D and weight  bearing exercise (ordered by gyn)  Return in about 24 weeks (around 10/13/2023) for Routine chronic condition follow-up.   Orders Placed This Encounter  Procedures   MM 3D SCREENING MAMMOGRAM BILATERAL BREAST   CBC   Comp Met (CMET)   TSH   Vitamin D (25 hydroxy)   Lipid panel   Hemoglobin A1c   Ambulatory referral to ENT   Meds ordered this encounter  Medications   traZODone (DESYREL) 150 MG tablet    Sig: Take 1 tablet (150 mg total) by mouth at bedtime.    Dispense:  90 tablet    Refill:  1   montelukast (SINGULAIR) 10 MG tablet    Sig: Take 1 tablet (10 mg total) by mouth at bedtime.    Dispense:  90 tablet    Refill:  2   levocetirizine (XYZAL) 5 MG tablet    Sig: Take 1 tablet (5 mg total) by mouth every evening.    Dispense:  90 tablet    Refill:  3   budesonide-formoterol (SYMBICORT) 80-4.5 MCG/ACT inhaler    Sig: Inhale 2 puffs into the lungs in the morning and at bedtime. with spacer and rinse mouth afterwards.    Dispense:  1 each    Refill:  5   ipratropium (ATROVENT) 0.06 % nasal spray    Sig: Place 2 sprays into both nostrils  4 (four) times daily.    Dispense:  15 mL    Refill:  12   fluticasone (FLONASE) 50 MCG/ACT nasal spray    Sig: Place 2 sprays into both nostrils daily.    Dispense:  16 g    Refill:  11    Referral Orders         Ambulatory referral to ENT       Electronically signed by: Felix Pacini, DO [facility] [place]

## 2023-04-28 NOTE — Patient Instructions (Addendum)

## 2023-05-01 ENCOUNTER — Encounter: Payer: Self-pay | Admitting: Family Medicine

## 2023-05-01 ENCOUNTER — Telehealth (INDEPENDENT_AMBULATORY_CARE_PROVIDER_SITE_OTHER): Payer: Self-pay | Admitting: Otolaryngology

## 2023-05-01 NOTE — Telephone Encounter (Signed)
Patient sent message through MyChart requesting a sooner appt.  Called to reschedule appt and LVM to call our office.

## 2023-05-18 ENCOUNTER — Ambulatory Visit (INDEPENDENT_AMBULATORY_CARE_PROVIDER_SITE_OTHER): Payer: Medicare HMO | Admitting: Otolaryngology

## 2023-05-18 ENCOUNTER — Encounter (INDEPENDENT_AMBULATORY_CARE_PROVIDER_SITE_OTHER): Payer: Self-pay | Admitting: Otolaryngology

## 2023-05-18 VITALS — BP 129/73 | HR 81 | Ht 61.5 in | Wt 128.0 lb

## 2023-05-18 DIAGNOSIS — R053 Chronic cough: Secondary | ICD-10-CM | POA: Diagnosis not present

## 2023-05-18 DIAGNOSIS — R0982 Postnasal drip: Secondary | ICD-10-CM | POA: Diagnosis not present

## 2023-05-18 DIAGNOSIS — J383 Other diseases of vocal cords: Secondary | ICD-10-CM

## 2023-05-18 DIAGNOSIS — R490 Dysphonia: Secondary | ICD-10-CM | POA: Diagnosis not present

## 2023-05-18 DIAGNOSIS — K219 Gastro-esophageal reflux disease without esophagitis: Secondary | ICD-10-CM

## 2023-05-18 DIAGNOSIS — J3089 Other allergic rhinitis: Secondary | ICD-10-CM

## 2023-05-18 DIAGNOSIS — R0981 Nasal congestion: Secondary | ICD-10-CM

## 2023-05-18 MED ORDER — FAMOTIDINE 20 MG PO TABS: 20.0000 mg | ORAL_TABLET | Freq: Two times a day (BID) | ORAL | 3 refills | Status: DC

## 2023-05-18 NOTE — Patient Instructions (Signed)
-   Take Reflux Gourmet (natural supplement available on Amazon) to help with symptoms of chronic throat irritation      GamingLesson.nl - check out this website to learn more about reflux   -Avoid lying down for at least two hours after a meal or after drinking acidic beverages, like soda, or other caffeinated beverages. This can help to prevent stomach contents from flowing back into the esophagus. -Keep your head elevated while you sleep. Using an extra pillow or two can also help to prevent reflux. -Eat smaller and more frequent meals each day instead of a few large meals. This promotes digestion and can aid in preventing heartburn. -Wear loose-fitting clothes to ease pressure on the stomach, which can worsen heartburn and reflux. -Reduce excess weight around the midsection. This can ease pressure on the stomach. Such pressure can force some stomach contents back up the esophagus

## 2023-05-18 NOTE — Progress Notes (Addendum)
ENT CONSULT:  Reason for Consult: chronic cough   HPI: Discussed the use of AI scribe software for clinical note transcription with the patient, who gave verbal consent to proceed.  History of Present Illness   Robin Banks is a 68 year old female with chronic cough who presents with persistent cough and hoarseness.  She has been experiencing a chronic cough for the past two months, marking the third occurrence in six months or prolonged bouts of coughing with improvement when she takes oral steroids. The cough is dry at times and productive a times, with mucus that is clear to slightly yellow, occasionally turning green. It occurs unpredictably at any time of day, sometimes waking her up at night, and is not associated with eating or drinking. Prednisone has been effective in stopping the cough in the past, with two courses in the last year, around July and October or November.  She has a history of adult-onset asthma, diagnosed around the time her son was on kidney dialysis. She uses montelukast daily and has albuterol for emergencies, though she has not needed it recently. She also uses Symbicort, primarily in the morning, and has been on inhalers for years due to asthma.  She experiences postnasal drainage and uses Xyzal (levocetirizine) at night, which was switched by her doctor within the last six months. She also uses Atrovent nasal spray up to four times a day and Flonase in the morning.  She has a history of GERD, diagnosed around the same time as her son's dialysis, and takes omeprazole as needed. She acknowledges not being consistent with this medication.  She reports hoarseness and voice quality issues, which worsen and impact her work and daily interactions. She stays hydrated, hoping it helps with her symptoms.  She has a history of insomnia, for which she takes trazodone. She recalls a period when trazodone was ineffective, and she was switched to another medication, possibly  amitriptyline, but returned to trazodone when the alternative did not help.  No trouble with swallowing, coughing when eating or drinking, and any specific triggers for the cough. Occasional nighttime awakening due to cough. No history of heartburn or reflux symptoms recently.     Records Reviewed:  PCP office visit 04/28/23 Robin Banks is a 68 y.o.  Female  present for CPE and Chronic Conditions/illness Management combination appt All past medical history, surgical history, allergies, family history, immunizations, medications and social history were updated in the electronic medical record today. All recent labs, ED visits and hospitalizations within the last year were reviewed.   Robin Banks is a 68 y.o. female present for CPE and chronic conditions management routine follow-up combination appointment Insomnia, unspecified type Stable Continue trazodone 150 mg nightly. -Patient would like to avoid any of the controlled substances including hypnotics. -Could consider gabapentin  -Tried: Trazodone, amitriptyline, Remeron - Follow-up every 5.5 months on current issues, sooner if still not sleeping well.  Could consider a sleep specialist to rule out other sleep disorder.   Moderate persistent asthma without complication/allergic rhinitis/seasonal allergies Stable Continue inhalers and Singulair  Continue OTC oral antihistamine PRN  Continue Flonase and Atrovent nasal spray Continue Symbicort 2 puffs twice daily Continue albuterol as needed   Elevated a1c 6.3 > 5.4> 5.5>5.8>6.0>A1c collected today   Persistent cough: Patient has had persistent cough despite reflux, allergy and asthma regimen increase.  We had discussed ENT referral as neck step and she is okay with [place] placing referral for her today. 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 Sherron Monday   CARPAL TUNNEL RELEASE Bilateral 2014   CATARACT EXTRACTION  12/2020   CATARACT EXTRACTION  01/2021   COLONOSCOPY     TUBAL LIGATION Bilateral 1978    Family History  Problem Relation Age of Onset   Hypertension Father    CAD Father    Hearing loss Father    Heart disease Father    COPD Father    Hyperlipidemia Mother    Arthritis Mother    Alcohol abuse Paternal Grandfather    Heart attack Paternal Grandfather    CAD Paternal Grandfather    Dementia Paternal Grandmother    Aortic aneurysm Maternal Grandmother    Cerebral palsy Son        Mild CP by records   Kidney nephrosis Son        FSG, kidney transplant.    Colon cancer Neg Hx    Esophageal cancer Neg Hx    Stomach cancer Neg Hx    Rectal cancer Neg Hx    Allergic rhinitis Neg Hx    Asthma Neg Hx    Eczema Neg Hx    Urticaria Neg Hx     Social History:  reports that she has never smoked. She has never used smokeless tobacco. She reports current alcohol use of about 1.0 standard drink of alcohol per week. She reports that she does not use drugs.  Allergies:  Allergies  Allergen Reactions   Demerol [Meperidine Hcl] Nausea And Vomiting   Nitrofurantoin Nausea Only    swelling    Medications: I have reviewed the patient's current medications.  The PMH, PSH, Medications, Allergies, and SH were reviewed and updated.  ROS: Constitutional: Negative for fever, weight loss and weight gain. Cardiovascular: Negative for chest pain and dyspnea on exertion. Respiratory: Is not experiencing  shortness of breath at rest. Gastrointestinal: Negative for nausea and vomiting. Neurological: Negative for headaches. Psychiatric: The patient is not nervous/anxious  Blood pressure 129/73, pulse 81, height 5' 1.5" (1.562 m), weight 128 lb (58.1 kg). Body mass index is 23.79 kg/m.  PHYSICAL EXAM:  Exam: General: Well-developed, well-nourished Communication and Voice: raspy Respiratory Respiratory effort: Equal inspiration and expiration without stridor Cardiovascular Peripheral Vascular: Warm extremities with equal color/perfusion Eyes: No nystagmus with equal extraocular motion bilaterally Neuro/Psych/Balance: Patient oriented to person, place, and time; Appropriate mood and affect; Gait is intact with no imbalance; Cranial nerves I-XII are intact Head and Face Inspection: Normocephalic and atraumatic without mass or lesion Palpation: Facial skeleton intact without bony stepoffs Salivary Glands: No mass or tenderness Facial Strength: Facial motility symmetric and full bilaterally ENT Pinna: External ear intact and fully developed External canal: Canal is patent with intact skin Tympanic Membrane: Clear and mobile External Nose: No scar or anatomic deformity Internal Nose: Septum is  deviated to the left. No polyp, or purulence. Mucosal edema and erythema present.  Bilateral inferior turbinate hypertrophy.  Lips, Teeth, and gums: Mucosa and teeth intact and viable TMJ: No pain to palpation with full mobility Oral cavity/oropharynx: No erythema or exudate, no lesions present Nasopharynx: No mass or lesion with intact mucosa Hypopharynx: Intact mucosa without pooling of secretions Larynx Glottic: Full true vocal cord mobility without lesion or mass Supraglottic: Normal appearing epiglottis and AE folds Interarytenoid Space: Moderate pachydermia&edema Subglottic Space: Patent without lesion or edema Neck Neck and Trachea: Midline trachea without mass or lesion Thyroid: No mass  or nodularity Lymphatics: No lymphadenopathy  Procedure: Preoperative diagnosis: chronic cough   Postoperative diagnosis:   Same + GERD LPR  Procedure: Flexible fiberoptic laryngoscopy  Surgeon: Ashok Croon, MD  Anesthesia: Topical lidocaine and Afrin Complications: None Condition is stable throughout exam  Indications and consent:  The patient presents to the clinic with dysphonia and chronic cough.  Indirect laryngoscopy view was incomplete. Thus it was recommended that they undergo a flexible fiberoptic laryngoscopy. All of the risks, benefits, and potential complications were reviewed with the patient preoperatively and verbal informed consent was obtained.  Procedure: The patient was seated upright in the clinic. Topical lidocaine and Afrin were applied to the nasal cavity. After adequate anesthesia had occurred, I then proceeded to pass the flexible telescope into the nasal cavity. The nasal cavity was patent without rhinorrhea or polyp. The nasopharynx was also patent without mass or lesion. The base of tongue was visualized and was normal. There were no signs of pooling of secretions in the piriform sinuses. The true vocal folds were mobile bilaterally. There were no signs of glottic or supraglottic mucosal lesion or mass. There was moderate interarytenoid pachydermia and post cricoid edema. The telescope was then slowly withdrawn and the patient tolerated the procedure throughout.      PROCEDURE NOTE: nasal endoscopy  Preoperative diagnosis: chronic nasal congestion and post-nasal drip symptoms  Postoperative diagnosis: same  Procedure: Diagnostic nasal endoscopy (16109)  Surgeon: Ashok Croon, M.D.  Anesthesia: Topical lidocaine and Afrin  H&P REVIEW: The patient's history and physical were reviewed today prior to procedure. All medications were reviewed and updated as well. Complications: None Condition is stable throughout exam Indications and consent: The  patient presents with symptoms of chronic sinusitis not responding to previous therapies. All the risks, benefits, and potential complications were reviewed with the patient preoperatively and informed consent was obtained. The time out was completed with confirmation of the correct procedure.   Procedure: The patient was seated upright in the clinic. Topical lidocaine and Afrin were applied to the nasal cavity. After adequate anesthesia had occurred, the rigid nasal endoscope was passed into the nasal cavity. The nasal mucosa, turbinates, septum, and sinus drainage pathways were visualized bilaterally. This revealed no purulence or significant secretions that might be cultured. There were no polyps or sites of significant inflammation. The mucosa was intact and there was no crusting present. The scope was then slowly withdrawn and the patient tolerated the procedure well. There were no complications or blood loss.   Studies Reviewed: 06/24/19 CXR  IMPRESSION: No acute cardiopulmonary process.  Assessment/Plan: Encounter Diagnoses  Name Primary?   Chronic cough Yes   Dysphonia    Glottic insufficiency    Vocal fold atrophy    Chronic nasal congestion    Environmental and seasonal allergies    Post-nasal drip    Age-related vocal fold atrophy    Hoarseness  Assessment and Plan    Chronic Cough Chronic cough on and off for 8 months, with recurrent episodes over the past two months. Contributing factors include hx of adult-onset asthma and hx of GERD and post-nasal drip. Exam including flexible laryngoscopy and nasal endoscopy without masses or lesions, normal VF mobility. She had changes c/w GERD LPR and VF atrophy with incomplete glottic closure. She reports allergy testing which was positive for dust mites in the past, several years ago. Differential diagnosis includes asthma, GERD, postnasal drainage, and neurogenic cough. Relief with prednisone in the past raises suspicion for  pulmonary causes, although post-nasal drainage could also respond to steroids. Discussed potential causes and management options, including Pepcid and Reflux Gourmet for reflux. - Order chest x-ray  - Start Pepcid BID 20 mg  - Recommend Reflux Gourmet after meals and at night - Continue current inhalers and nasal sprays/Singulair and Xyzal  - Referral to pulmonary for further evaluation and pulmonary function tests - Referral to speech therapy for voice therapy and cough suppression techniques  Chronic Gastroesophageal Reflux Disease (GERD) GERD with inconsistent omeprazole use. Reflux changes observed on scope exam, likely contributing to chronic cough and hoarseness. Discussed dietary and lifestyle modifications. - Start Pepcid 20 mg BID - Recommend Reflux Gourmet after meals and at night - Discuss dietary and lifestyle modifications - will consider referral to GI in the future for EGD  Hoarseness/VF atrophy and glottic insufficiency on scope exam Hoarseness with vocal fold atrophy on exam, no masses or lesions or VF paralysis on exam. Discussed voice therapy benefits - Referral to speech therapy for voice therapy and for chronic cough   Chronic nasal congestion Environmental Allergies Post-Nasal Drip - continue Xyzal 5 mg daily Singulair 10 mg daily  - continue Flonase 2 puffs b/l nares BID  - continue Atrovent 2 puffs b/l nares BID - will consider repeating allergy testing in the future   Follow-up - Schedule follow-up appointment in a few months - Call with chest x-ray results - Schedule appointments with speech therapy and pulmonary as soon as possible.          Thank you for allowing me to participate in the care of this patient. Please do not hesitate to contact me with any questions or concerns.   Ashok Croon, MD Otolaryngology [facility] Phone: [phone] Fax: [phone]    05/18/2023, 12:31 PM

## 2023-06-04 ENCOUNTER — Encounter: Payer: Self-pay | Admitting: Family Medicine

## 2023-06-05 ENCOUNTER — Ambulatory Visit: Admitting: Family Medicine

## 2023-06-11 ENCOUNTER — Encounter: Payer: Self-pay | Admitting: Family Medicine

## 2023-06-12 NOTE — Telephone Encounter (Signed)
 Called and spoke with pt

## 2023-06-19 ENCOUNTER — Institutional Professional Consult (permissible substitution) (INDEPENDENT_AMBULATORY_CARE_PROVIDER_SITE_OTHER): Payer: Medicare HMO | Admitting: Otolaryngology

## 2023-06-20 ENCOUNTER — Ambulatory Visit: Admitting: Family Medicine

## 2023-06-21 ENCOUNTER — Other Ambulatory Visit: Payer: Self-pay

## 2023-06-21 ENCOUNTER — Ambulatory Visit: Attending: Otolaryngology

## 2023-06-21 DIAGNOSIS — R053 Chronic cough: Secondary | ICD-10-CM | POA: Insufficient documentation

## 2023-06-21 DIAGNOSIS — R490 Dysphonia: Secondary | ICD-10-CM | POA: Diagnosis not present

## 2023-06-21 DIAGNOSIS — J383 Other diseases of vocal cords: Secondary | ICD-10-CM | POA: Diagnosis not present

## 2023-06-21 NOTE — Patient Instructions (Signed)
    Practice 10 abdominal breaths before you practice PHoRTE exercises  PHoRTE VOICE EXERCISES - DO TWICE EACH DAY  1) Hold "AHHHHHHHHHHHHH" 10 times   As long as you can as strong as you can - push from your abdominal muscles!   [email or web address]   =====================================================   What to Do Instead of Clearing Your Throat Use these strategies instead of clearing your throat: 1. Swallow your saliva. Swallow as hard as you can.  2. Take a drink of water. 3. Suck on ice chips. 4. Use a silent cough. Whisper the word "huh" from your belly without making a sound and then swallow. This is like a cough but without using your voice. 5. Hum on an "M" and then swallow. 6. Use a light, gentle cough (like tapping your vocal folds together) and then swallow. 7. Silently count to 10 and then swallow

## 2023-06-21 NOTE — Therapy (Signed)
 OUTPATIENT SPEECH LANGUAGE PATHOLOGY VOICE EVALUATION   Patient Name: Robin Banks MRN: [id number] DOB:1955-11-17, 68 y.o., female Today's Date: 06/21/2023  PCP: Felix Pacini, DO REFERRING PROVIDER: Ashok Croon, MD  END OF SESSION:  End of Session - 06/21/23 1729     Visit Number 1    Number of Visits 15    Date for SLP Re-Evaluation 07/28/23    SLP Start Time 1620    SLP Stop Time  1700    SLP Time Calculation (min) 40 min    Activity Tolerance Patient tolerated treatment well             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 Sherron Monday   CARPAL TUNNEL RELEASE Bilateral 2014   CATARACT EXTRACTION  12/2020   CATARACT EXTRACTION  01/2021   COLONOSCOPY     TUBAL LIGATION Bilateral 1978   Patient Active Problem List   Diagnosis Date Noted   Synovitis and tenosynovitis 08/10/2021   Bilateral carpal tunnel syndrome 02/24/2021   Seasonal and perennial allergic rhinitis 06/27/2019   Hallux rigidus, acquired, left 06/04/2018   Elevated hemoglobin A1c 04/07/2017   Major depressive disorder, recurrent episode, moderate (HCC) 01/30/2017   Postmenopausal symptoms 10/28/2015   Osteopenia 10/28/2015   Overactive bladder 10/28/2015   Insomnia 10/12/2015   Asthma 01/09/2007    Onset date: "A few years"  REFERRING DIAG: R05.3 (ICD-10-CM) - Chronic cough R49.0 (ICD-10-CM) - Dysphonia J38.3 (ICD-10-CM) - Glottic  insufficiency J38.3 (ICD-10-CM) - Vocal fold atrophy  THERAPY DIAG:  Voice hoarseness  Rationale for Evaluation and Treatment: Rehabilitation  SUBJECTIVE:   SUBJECTIVE STATEMENT: "I have a chronic cough, but I am now taking Pepcid and it's much better." Pt rated cough 2/10 today (where 10/10 was cough severity prior to ENT evaluation in February)  Pt accompanied by: self  PERTINENT HISTORY:  See above  PAIN:  Are you having pain? No  FALLS: Has patient fallen in last 6 months? No  LIVING ENVIRONMENT: Lives with: lives with their spouse Lives in: House/apartment  PLOF:Level of assistance: Independent with ADLs, Independent with IADLs Employment: Full-time employment  PATIENT GOALS: "Get rid of this (hoarse rough voice)  OBJECTIVE:  Note: Objective measures were completed at Evaluation unless otherwise noted.  DIAGNOSTIC FINDINGS:  Assessment and Plan -[place] 05/18/23 Chronic Cough Chronic cough on and off for 8 months, with recurrent episodes over the past two months. Contributing factors include hx of adult-onset asthma and hx of GERD and post-nasal drip. Exam including flexible laryngoscopy and nasal endoscopy without masses or lesions, normal VF mobility. She had changes c/w GERD LPR and VF atrophy with incomplete glottic closure. She reports allergy testing which was positive for dust mites in the past, several years ago. Differential diagnosis includes asthma, GERD, postnasal drainage, and neurogenic cough. Relief with prednisone  in the past raises suspicion for pulmonary causes, although post-nasal drainage could also respond to steroids. Discussed potential causes and management options, including Pepcid and Reflux Gourmet for reflux. - Order chest x-ray  - Start Pepcid BID 20 mg  - Recommend Reflux Gourmet after meals and at night - Continue current inhalers and nasal sprays/Singulair and Xyzal  - Referral to pulmonary for further evaluation and pulmonary function  tests - Referral to speech therapy for voice therapy and cough suppression techniques   Chronic Gastroesophageal Reflux Disease (GERD) GERD with inconsistent omeprazole use. Reflux changes observed on scope exam, likely contributing to chronic cough and hoarseness. Discussed dietary and lifestyle modifications. - Start Pepcid 20 mg BID - Recommend Reflux Gourmet after meals and at night - Discuss dietary and lifestyle modifications - will consider referral to GI in the future for EGD   Hoarseness/VF atrophy and glottic insufficiency on scope exam Hoarseness with vocal fold atrophy on exam, no masses or lesions or VF paralysis on exam. Discussed voice therapy benefits - Referral to speech therapy for voice therapy and for chronic cough    Chronic nasal congestion Environmental Allergies Post-Nasal Drip - continue Xyzal 5 mg daily Singulair 10 mg daily  - continue Flonase 2 puffs b/l nares BID  - continue Atrovent 2 puffs b/l nares BID - will consider repeating allergy testing in the future    Follow-up - Schedule follow-up appointment in a few months - Call with chest x-ray results - Schedule appointments with speech therapy and pulmonary as soon as possible.   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 Resonance: normal Respiratory function: diaphragmatic/abdominal breathing  OBJECTIVE VOICE ASSESSMENT: Maximum phonation time for sustained "ah": 10.54 seconds Conversational pitch average: 214 Hz Conversational pitch range: 82-378  Hz Conversational loudness average: 70 dB Conversational loudness range: 54-77 dB S/z ratio: 1.41 (Suggestive of dysfunction >1.0)  PATIENT REPORTED OUTCOME MEASURES (PROM): V-RQOL: administered in first 1-2 sessions                                                                                                                            TREATMENT DATE:  Phonatory Resistance Training Exercises=PhoRTE, Vocal function exercises=VFE, Semi-occluded vocal tract exercises=SOVTE  06/21/23: Education below provided, benefits of voice therapy, SLP introduced pt and trained her with PhoRTE exercise x1 (/a/).  PATIENT EDUCATION: Education details: Throat clear alternatives, PhoRTE exercise #1 Person educated: Patient Education method: Explanation, Demonstration, Verbal cues, and Handouts Education comprehension: verbalized understanding, returned demonstration, verbal cues required, and needs further education  HOME EXERCISE PROGRAM: PhoRTE  exercise #1  GOALS: Goals reviewed with patient? Yes in general  SHORT TERM GOALS: Target date: 07/28/23 (due to first visit week of 07/03/23)  Pt will complete PROM Baseline: Goal status: INITIAL  2.  Pt will complete PhoRTE exercises with rare min A in 2 sessions Baseline:  Goal status: INITIAL  3.  Pt will report (subjectively) greater vocal stamina than prior to ST Baseline:  Goal status: INITIAL  4.  Pt will demo AB 80% in 5 minutes simple conversation  Baseline:  Goal status: INITIAL  5.  Pt will demo WNL voice quality in 3 minutes conversation Baseline:  Goal status: INITIAL   LONG TERM GOALS: Target date: 08/25/23  Pt will improve V-RQOL score Baseline:  Goal status: INITIAL  2.  Pt will demo WNL voice quality 85% of the time in 10 minutes simple conversation in 2 sessions Baseline:  Goal status: INITIAL  3.  Pt will demo AB 80% in 10 minutes simple conversation Baseline:  Goal status: INITIAL  4.  Pt will complete PhoRTE  exercises with mod independence in 2 sessions Baseline:  Goal status: INITIAL  5.  Pt will indicate she is comfortable reading aloud for Bible study Baseline:  Goal status: INITIAL   ASSESSMENT:  CLINICAL IMPRESSION: Patient is a 68 y.o. F who was seen today for assessment of voice due to vocal fold atrophy and glottal insufficiency. She indicated that in longer conversations she feels her voice does not have the same stamina it used to and "gets very weak." Lastly, she no longer reads scripture at Bible study. Pt would benefit from SLP guiding her through PhoRTE exercise regimen and also work on improved breath support. SLP may also need to include other voice exercises and techniques into her plan of care (flow phonation, VFE, SOVTE, etc) at some time.   OBJECTIVE IMPAIRMENTS: include voice disorder. These impairments are limiting patient from household responsibilities, ADLs/IADLs, and effectively communicating at home and in community. Factors affecting potential to achieve goals and functional outcome are  none noted .Marland Kitchen Patient will benefit from skilled SLP services to address above impairments and improve overall function.  REHAB POTENTIAL: Excellent  PLAN:  SLP FREQUENCY: 1-2x/week  SLP DURATION: 8 weeks  PLANNED INTERVENTIONS:Voice exercises, Internal/external aids, Functional tasks, SLP instruction and feedback, Compensatory strategies, Patient/family education, and [id number] Treatment of speech (30 or 45 min)     Leverne Tessler, CCC-SLP 06/21/2023, 6:01 PM

## 2023-07-03 ENCOUNTER — Ambulatory Visit: Attending: Otolaryngology

## 2023-07-03 DIAGNOSIS — R490 Dysphonia: Secondary | ICD-10-CM | POA: Diagnosis not present

## 2023-07-03 NOTE — Patient Instructions (Signed)
  Practice 10 abdominal breaths before you practice PHoRTE exercises  Sing using a straw for 2 minutes before before you practice PHoRTE exercises   PHoRTE VOICE EXERCISES - DO TWICE EACH DAY  1) Hold "AHHHHHHHHHHHHH" 10 times   As long as you can as strong as you can - push from your abdominal muscles!   [email or web address]  2) Count 1-10 (skip 7)   Start at as low pitch as you can, glide to as high pitch as you can, then back down as low as you can   [email or web address]  3) Say 10 sentences -two different ways   (a) like you are calling over the fence to your neighbor in a higher-pitch voice  (b) like you are scolding your dog in a LOW authoritative voice   [email or web address]

## 2023-07-03 NOTE — Therapy (Signed)
 OUTPATIENT SPEECH LANGUAGE PATHOLOGY VOICE TREATMENT   Patient Name: Robin Banks MRN: [id number] DOB:07-Jan-1956, 68 y.o., female Today's Date: 07/03/2023  PCP: Felix Pacini, DO REFERRING PROVIDER: Ashok Croon, MD  END OF SESSION:  End of Session - 07/03/23 1730     Visit Number 2    Number of Visits 15    Date for SLP Re-Evaluation 07/28/23    SLP Start Time 1535    SLP Stop Time  1615    SLP Time Calculation (min) 40 min    Activity Tolerance Patient tolerated treatment well              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 Sherron Monday   CARPAL TUNNEL RELEASE Bilateral 2014   CATARACT EXTRACTION  12/2020   CATARACT EXTRACTION  01/2021   COLONOSCOPY     TUBAL LIGATION Bilateral 1978   Patient Active Problem List   Diagnosis Date Noted   Synovitis and tenosynovitis 08/10/2021   Bilateral carpal tunnel syndrome 02/24/2021   Seasonal and perennial allergic rhinitis 06/27/2019   Hallux rigidus, acquired, left 06/04/2018   Elevated hemoglobin A1c 04/07/2017   Major depressive disorder, recurrent episode, moderate (HCC) 01/30/2017   Postmenopausal symptoms 10/28/2015   Osteopenia 10/28/2015   Overactive bladder 10/28/2015   Insomnia 10/12/2015   Asthma 01/09/2007    Onset date: "A few years"  REFERRING DIAG: R05.3 (ICD-10-CM) - Chronic cough R49.0 (ICD-10-CM) - Dysphonia 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 No  FALLS: Has patient fallen in last 6 months? No  PATIENT GOALS: "Get rid of this (hoarse rough voice)  OBJECTIVE:  Note: Objective measures were completed at Evaluation unless otherwise noted.  DIAGNOSTIC FINDINGS:  Assessment and Plan -[place] 05/18/23 Chronic Cough Chronic cough on and off for 8 months, with recurrent episodes over the past two months. Contributing factors include hx of adult-onset asthma and hx of GERD and post-nasal drip. Exam including flexible laryngoscopy and nasal endoscopy without masses or lesions, normal VF mobility. She had changes c/w GERD LPR and VF atrophy with incomplete glottic closure. She reports allergy testing which was positive for dust mites in the past, several years ago. Differential diagnosis includes asthma, GERD, postnasal drainage, and neurogenic cough. Relief with prednisone in the past raises suspicion for pulmonary causes, although post-nasal drainage could also respond to steroids. Discussed potential causes and management options, including Pepcid and Reflux Gourmet for reflux. - Order chest x-ray  - Start Pepcid BID 20 mg  - Recommend Reflux Gourmet after meals and at night -  Continue current inhalers and nasal sprays/Singulair and Xyzal  - Referral to pulmonary for further evaluation and pulmonary function tests - Referral to speech therapy for voice therapy and cough suppression techniques   Chronic Gastroesophageal Reflux Disease (GERD) GERD with inconsistent omeprazole use. Reflux changes observed on scope exam, likely contributing to chronic cough and hoarseness. Discussed dietary and lifestyle modifications. 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 Discussed voice therapy benefits - Referral to speech therapy for voice therapy and for chronic cough    Chronic nasal congestion Environmental Allergies Post-Nasal Drip - continue Xyzal 5 mg daily Singulair 10 mg daily  - continue Flonase 2 puffs b/l nares BID  - continue Atrovent 2 puffs b/l nares BID - will consider repeating allergy testing in the future    Follow-up - Schedule follow-up appointment in a few months - Call with chest x-ray results - Schedule appointments with speech therapy and pulmonary as soon as possible.    PATIENT REPORTED OUTCOME MEASURES (PROM): V-RQOL: provided 07/03/23.                                                                                                                            TREATMENT DATE:  Phonatory Resistance Training Exercises=PhoRTE, Vocal function exercises=VFE, Semi-occluded vocal tract exercises=SOVTE  07/03/23: SLP began with introducing AB with pt. At rest she was approx 75% successful with AB. SLP introduced PhoRTE exercise #2 and #3; she req'd occasional min A faded to modified independent. SLP used [employer] provided with AVS today as well as verbal and demo cues from SLP.  06/21/23: Education below provided, benefits of voice therapy, SLP introduced pt and trained her with PhoRTE exercise x1 (/a/).  PATIENT EDUCATION: Education details: AB, PhoRTE exercises Person educated: Patient Education method: Explanation, Demonstration, Verbal cues, and Handouts Education comprehension: verbalized understanding, returned demonstration, verbal cues required, and needs further education  HOME EXERCISE PROGRAM: PhoRTE  exercises  GOALS: Goals reviewed with patient? Yes in general  SHORT TERM GOALS: Target date: 07/28/23 (due to first visit week of 07/03/23)  Pt will complete PROM Baseline: Goal status: INITIAL  2.  Pt will complete PhoRTE exercises with rare min A in 2 sessions Baseline:  Goal status: INITIAL  3.  Pt will report (subjectively) greater vocal stamina than prior to ST Baseline:  Goal status: INITIAL  4.  Pt will demo AB 80% in 5 minutes simple conversation  Baseline:  Goal status: INITIAL  5.  Pt will demo WNL voice quality in 3 minutes conversation Baseline:  Goal status: INITIAL   LONG TERM GOALS: Target date: 08/25/23  Pt will improve V-RQOL score Baseline:  Goal status: INITIAL  2.  Pt will demo WNL voice quality 85% of the time in 10 minutes simple conversation in 2 sessions Baseline:  Goal status: INITIAL  3.  Pt will demo AB 80% in 10 minutes simple conversation Baseline:  Goal status: INITIAL  4.  Pt will complete PhoRTE exercises with mod independence in 2 sessions Baseline:  Goal status: INITIAL  5.  Pt will indicate she is comfortable reading aloud for Bible study Baseline:  Goal status: INITIAL   ASSESSMENT:  CLINICAL IMPRESSION: Patient is a 68 y.o. 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 Today she worked with SLP on honing procedure with PhoRTE exercises and learned how to use AB for breathing. See "treatment date" above for today's date for further details on today's session.   On eval date she indicated that in longer conversations she feels her voice does not have the same stamina it used to and "gets very weak." Lastly, she no longer reads scripture at Bible study. Pt would benefit from SLP guiding her through PhoRTE exercise regimen and also work on improved breath support. SLP may also need to include other voice exercises and techniques into her plan of care (flow phonation, VFE, SOVTE, etc)  at some time.   OBJECTIVE IMPAIRMENTS: include voice disorder. These impairments are limiting patient from household responsibilities, ADLs/IADLs, and effectively communicating at home and in community. Factors affecting potential to achieve goals and functional outcome are  none noted .Marland Kitchen Patient will benefit from skilled SLP services to address above impairments and improve overall function.  REHAB POTENTIAL: Excellent  PLAN:  SLP FREQUENCY: 1-2x/week  SLP DURATION: 8 weeks  PLANNED INTERVENTIONS:Voice exercises, Internal/external aids, Functional tasks, SLP instruction and feedback, Compensatory strategies, Patient/family education, and 16109 Treatment of speech (30 or 45 min)     Sharisa Toves, CCC-SLP 07/03/2023, 5:30 PM

## 2023-07-07 ENCOUNTER — Ambulatory Visit

## 2023-07-07 ENCOUNTER — Ambulatory Visit
Admission: RE | Admit: 2023-07-07 | Discharge: 2023-07-07 | Disposition: A | Discharge status: Home / Self Care | Source: Ambulatory Visit | Attending: Family Medicine | Admitting: Family Medicine

## 2023-07-07 DIAGNOSIS — R490 Dysphonia: Secondary | ICD-10-CM

## 2023-07-07 DIAGNOSIS — Z1231 Encounter for screening mammogram for malignant neoplasm of breast: Secondary | ICD-10-CM | POA: Diagnosis not present

## 2023-07-07 NOTE — Therapy (Addendum)
 OUTPATIENT SPEECH LANGUAGE PATHOLOGY VOICE TREATMENT   Patient Name: Robin Banks MRN: [id number] DOB:Oct 08, 1955, 68 y.o., female Today's Date: 07/07/2023  PCP: Felix Pacini, DO REFERRING PROVIDER: Ashok Croon, MD  END OF SESSION:  End of Session - 07/07/23 1106     Visit Number 3    Number of Visits 15    Date for SLP Re-Evaluation 07/28/23    SLP Start Time 1018    SLP Stop Time  1051    SLP Time Calculation (min) 33 min    Activity Tolerance Patient tolerated treatment well               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 Sherron Monday   CARPAL TUNNEL RELEASE Bilateral 2014   CATARACT EXTRACTION  12/2020   CATARACT EXTRACTION  01/2021   COLONOSCOPY     TUBAL LIGATION Bilateral 1978   Patient Active Problem List   Diagnosis Date Noted   Synovitis and tenosynovitis 08/10/2021   Bilateral carpal tunnel syndrome 02/24/2021   Seasonal and perennial allergic rhinitis 06/27/2019   Hallux rigidus, acquired, left 06/04/2018   Elevated hemoglobin A1c 04/07/2017   Major depressive disorder, recurrent episode, moderate (HCC) 01/30/2017   Postmenopausal symptoms 10/28/2015   Osteopenia 10/28/2015   Overactive bladder 10/28/2015   Insomnia 10/12/2015   Asthma 01/09/2007    Onset date: "A few years"  REFERRING DIAG: R05.3 (ICD-10-CM) - Chronic cough R49.0 (ICD-10-CM) - Dysphonia 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 No  FALLS: Has patient fallen in last 6 months? No  PATIENT GOALS: "Get rid of this (hoarse rough voice)  OBJECTIVE:  Note: Objective measures were completed at Evaluation unless otherwise noted.  DIAGNOSTIC FINDINGS:  Assessment and Plan -[place] 05/18/23 Chronic Cough Chronic cough on and off for 8 months, with recurrent episodes over the past two months. Contributing factors include hx of adult-onset asthma and hx of GERD and post-nasal drip. Exam including flexible laryngoscopy and nasal endoscopy without masses or lesions, normal VF mobility. She had changes c/w GERD LPR and VF atrophy with incomplete glottic closure. She reports allergy testing which was positive for dust mites in the past, several years ago. Differential diagnosis includes asthma, GERD, postnasal drainage, and neurogenic cough. Relief with prednisone in the past raises suspicion for pulmonary causes, although post-nasal drainage could also respond to steroids. Discussed potential causes and management options, including Pepcid and Reflux Gourmet for reflux. - Order chest x-ray  - Start Pepcid BID 20 mg  - Recommend Reflux Gourmet after meals and at  night - Continue current inhalers and nasal sprays/Singulair and Xyzal  - Referral to pulmonary for further evaluation and pulmonary function tests - Referral to speech therapy for voice therapy and cough suppression techniques   Chronic Gastroesophageal Reflux Disease (GERD) GERD with inconsistent omeprazole use. Reflux changes observed on scope exam, likely contributing to chronic cough and hoarseness. Discussed dietary and lifestyle modifications. 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 Discussed voice therapy benefits - Referral to speech therapy for voice therapy and for chronic cough    Chronic nasal congestion Environmental Allergies Post-Nasal Drip - continue Xyzal 5 mg daily Singulair 10 mg daily  - continue Flonase 2 puffs b/l nares BID  - continue Atrovent 2 puffs b/l nares BID - will consider repeating allergy testing in the future    Follow-up - Schedule follow-up appointment in a few months - Call with chest x-ray results - Schedule appointments with speech therapy and pulmonary as soon as possible.    PATIENT REPORTED OUTCOME MEASURES (PROM): V-RQOL: provided 07/03/23. Returned with scoring herself 20 raw score, and V-RQOL score of 75, correlating to fair to good QOL. She rated her voice of "fair" quality in the last two weeks.                                                                                                                             TREATMENT DATE:  Phonatory Resistance Training Exercises=PhoRTE, Vocal function exercises=VFE, Semi-occluded vocal tract exercises=SOVTE  07/07/23:  PhoRTE practice today guided by SLP - Naveh needed cues from SLP for procedure with exercise #2 (keep strong voice) and #3 (10 high pitched, then 10 low pitched and not alternate high low, and lower pitch on low-pitch sentences). SLP cont'd teaching AB to pt and she demonstrated AB approx 85% at rest. Homework for 12-15 minutes AB BID.     07/03/23: SLP began with introducing AB with pt. At rest she was approx 75% successful with AB. SLP introduced PhoRTE exercise #2 and #3; she req'd occasional min A faded to modified independent. SLP used [employer] provided  with AVS today as well as verbal and demo cues from SLP.  06/21/23: Education below provided, benefits of voice therapy, SLP introduced pt and trained her with PhoRTE exercise x1 (/a/).  PATIENT EDUCATION: Education details: See "treatment date" Person educated: Patient Education method: Explanation, Demonstration, Verbal cues, and Handouts Education comprehension: verbalized understanding, returned demonstration, verbal cues required, and needs further education  HOME EXERCISE PROGRAM: PhoRTE exercises, AB  GOALS: Goals reviewed with patient? Yes in general  SHORT TERM GOALS: Target date: 07/28/23 (due to first visit week of 07/03/23)  Pt will complete PROM  Baseline: Goal status: Met  2.  Pt will complete PhoRTE exercises with rare min A in 2 sessions Baseline:  Goal status: INITIAL  3.  Pt will report (subjectively) greater vocal stamina than prior to ST Baseline:  Goal status: INITIAL  4.  Pt will demo AB 80% in 5 minutes simple conversation  Baseline:  Goal status: INITIAL  5.  Pt will demo WNL voice quality in 3 minutes conversation Baseline:  Goal status: INITIAL   LONG TERM GOALS: Target date: 08/25/23  Pt will improve V-RQOL score Baseline:  Goal status: INITIAL  2.  Pt will demo WNL voice quality 85% of the time in 10 minutes simple conversation in 2 sessions Baseline:  Goal status: INITIAL  3.  Pt will demo AB 80% in 10 minutes simple conversation Baseline:  Goal status: INITIAL  4.  Pt will complete PhoRTE exercises with mod independence in 2 sessions Baseline:  Goal status: INITIAL  5.  Pt will indicate she is comfortable reading aloud for Bible study Baseline:  Goal status: INITIAL   ASSESSMENT:  CLINICAL IMPRESSION: Patient is a 68 y.o. 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 Today she worked with SLP on honing procedure with PhoRTE exercises and continued to work with SLP with AB for  breathing at rest. See "treatment date" above for today's date for further details on today's session. On eval date she indicated that in longer conversations she feels her voice does not have the same stamina it used to and "gets very weak." Lastly, she no longer reads scripture at Bible study. Pt would benefit from SLP guiding her through PhoRTE exercise regimen and also work on improved breath support. SLP may also need to include other voice exercises and techniques into her plan of care (flow phonation, VFE, SOVTE, etc) at some time.   OBJECTIVE IMPAIRMENTS: include voice disorder. These impairments are limiting patient from household responsibilities, ADLs/IADLs, and effectively communicating at home and in community. Factors affecting potential to achieve goals and functional outcome are  none noted .Marland Kitchen Patient will benefit from skilled SLP services to address above impairments and improve overall function.  REHAB POTENTIAL: Excellent  PLAN:  SLP FREQUENCY: 1-2x/week  SLP DURATION: 8 weeks  PLANNED INTERVENTIONS:Voice exercises, Internal/external aids, Functional tasks, SLP instruction and feedback, Compensatory strategies, Patient/family education, and 91478 Treatment of speech (30 or 45 min)     Jazilyn Siegenthaler, CCC-SLP 07/07/2023, 11:07 AM

## 2023-07-10 ENCOUNTER — Ambulatory Visit

## 2023-07-10 DIAGNOSIS — R490 Dysphonia: Secondary | ICD-10-CM | POA: Diagnosis not present

## 2023-07-10 NOTE — Therapy (Signed)
 OUTPATIENT SPEECH LANGUAGE PATHOLOGY VOICE TREATMENT   Patient Name: Robin Banks MRN: [id number] DOB:10-26-1955, 68 y.o., female Today's Date: 07/10/2023  PCP: Napolean Backbone, DO REFERRING PROVIDER: Artice Last, MD  END OF SESSION:  End of Session - 07/10/23 1726     Visit Number 4    Number of Visits 15    Date for SLP Re-Evaluation 07/28/23    SLP Start Time 1620    SLP Stop Time  1700    SLP Time Calculation (min) 40 min    Activity Tolerance Patient tolerated treatment well                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 Clarke Crouch   CARPAL TUNNEL RELEASE Bilateral 2014   CATARACT EXTRACTION  12/2020   CATARACT EXTRACTION  01/2021   COLONOSCOPY     TUBAL LIGATION Bilateral 1978   Patient Active Problem List   Diagnosis Date Noted   Synovitis and tenosynovitis 08/10/2021   Bilateral carpal tunnel syndrome 02/24/2021   Seasonal and perennial allergic rhinitis 06/27/2019   Hallux rigidus, acquired, left 06/04/2018   Elevated hemoglobin A1c 04/07/2017   Major depressive disorder, recurrent episode, moderate (HCC) 01/30/2017   Postmenopausal symptoms 10/28/2015   Osteopenia 10/28/2015   Overactive bladder 10/28/2015   Insomnia 10/12/2015   Asthma 01/09/2007    Onset date: "A few years"  REFERRING DIAG: R05.3 (ICD-10-CM) - Chronic cough R49.0 (ICD-10-CM) - Dysphonia 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 No  FALLS: Has patient fallen in last 6 months? No  PATIENT GOALS: "Get rid of this (hoarse rough voice)  OBJECTIVE:  Note: Objective measures were completed at Evaluation unless otherwise noted.  DIAGNOSTIC FINDINGS:  Assessment and Plan -[place] 05/18/23 Chronic Cough Chronic cough on and off for 8 months, with recurrent episodes over the past two months. Contributing factors include hx of adult-onset asthma and hx of GERD and post-nasal drip. Exam including flexible laryngoscopy and nasal endoscopy without masses or lesions, normal VF mobility. She had changes c/w GERD LPR and VF atrophy with incomplete glottic closure. She reports allergy testing which was positive for dust mites in the past, several years ago. Differential diagnosis includes asthma, GERD, postnasal drainage, and neurogenic cough. Relief with prednisone in the past raises suspicion for pulmonary causes, although post-nasal drainage could also respond to steroids. Discussed potential causes and management options, including Pepcid and Reflux Gourmet for reflux. - Order chest x-ray  - Start Pepcid BID 20 mg  - Recommend Reflux Gourmet after meals and  at night - Continue current inhalers and nasal sprays/Singulair and Xyzal  - Referral to pulmonary for further evaluation and pulmonary function tests - Referral to speech therapy for voice therapy and cough suppression techniques   Chronic Gastroesophageal Reflux Disease (GERD) GERD with inconsistent omeprazole use. Reflux changes observed on scope exam, likely contributing to chronic cough and hoarseness. Discussed dietary and lifestyle modifications. 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 Discussed voice therapy benefits - Referral to speech therapy for voice therapy and for chronic cough    Chronic nasal congestion Environmental Allergies Post-Nasal Drip - continue Xyzal 5 mg daily Singulair 10 mg daily  - continue Flonase 2 puffs b/l nares BID  - continue Atrovent 2 puffs b/l nares BID - will consider repeating allergy testing in the future    Follow-up - Schedule follow-up appointment in a few months - Call with chest x-ray results - Schedule appointments with speech therapy and pulmonary as soon as possible.    PATIENT REPORTED OUTCOME MEASURES (PROM): V-RQOL: provided 07/03/23. Returned with scoring herself 20 raw score, and V-RQOL score of 75, correlating to fair to good QOL. She rated her voice of "fair" quality in the last two weeks.                                                                                                                             TREATMENT DATE:  Phonatory Resistance Training Exercises=PhoRTE, Vocal function exercises=VFE, Semi-occluded vocal tract exercises=SOVTE  07/10/23: Pt brought in a Breath as Prayer book to show SLP. SLP suggested she meditate with the breath prayers as she practices AB, as she has had difficulty with practicing AB. SLP reviewed PhoRTE with pt today; she did not adhere to frequency over the weekend due to unexpected plans. SLP needed to provide A for using AB for each part of PhoRTE, and for strong voice with numbers.  SLP introduced VFE with pt today and she needed max A but was mod I by the end of session. See "pt instructions". SL Ptold pt that she would need to cont exercises with 2-3x/week frequency after ST  d/c.  07/07/23:  PhoRTE practice today guided by SLP - Maycee needed cues from SLP for procedure with exercise #2 (keep strong voice) and #3 (10 high pitched, then 10 low pitched and not alternate high low, and lower pitch on low-pitch sentences). SLP cont'd teaching AB to pt and she demonstrated AB approx 85% at rest. Homework for 12-15 minutes AB BID.     07/03/23: SLP began with introducing AB with pt. At rest she was approx 75% successful with AB. SLP introduced PhoRTE exercise #2 and #3; she  req'd occasional min A faded to modified independent. SLP used [employer] provided with AVS today as well as verbal and demo cues from SLP.  06/21/23: Education below provided, benefits of voice therapy, SLP introduced pt and trained her with PhoRTE exercise x1 (/a/).  PATIENT EDUCATION: Education details: See "treatment date" Person educated: Patient Education method: Explanation, Demonstration, Verbal cues, and Handouts Education comprehension: verbalized understanding, returned demonstration, verbal cues required, and needs further education  HOME EXERCISE PROGRAM: PhoRTE exercises, AB, VFE  GOALS: Goals reviewed with patient? Yes in general  SHORT TERM GOALS: Target date: 07/28/23 (due to first visit week of 07/03/23)  Pt will complete PROM Baseline: Goal status: Met  2.  Pt will complete PhoRTE exercises with rare min A in 2 sessions Baseline:  Goal status: INITIAL  3.  Pt will report (subjectively) greater vocal stamina than prior to ST Baseline:  Goal status: INITIAL  4.  Pt will demo AB 80% in 5 minutes simple conversation  Baseline:  Goal status: INITIAL  5.  Pt will demo WNL voice quality in 3 minutes conversation Baseline:  Goal status: INITIAL   LONG TERM GOALS: Target date: 08/25/23  Pt will improve V-RQOL score Baseline:  Goal status: INITIAL  2.  Pt will demo WNL voice quality 85% of the time in 10 minutes simple conversation in 2 sessions Baseline:  Goal status:  INITIAL  3.  Pt will demo AB 80% in 10 minutes simple conversation Baseline:  Goal status: INITIAL  4.  Pt will complete PhoRTE exercises with mod independence in 2 sessions Baseline:  Goal status: INITIAL  5.  Pt will indicate she is comfortable reading aloud for Bible study Baseline:  Goal status: INITIAL   ASSESSMENT:  CLINICAL IMPRESSION: Patient is a 68 y.o. 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 Today she worked with SLP on honing procedure with PhoRTE exercises and continued to work with SLP with AB for breathing at rest. SLP introduced VFE today to further work with vocal fold strengthening/bulking. See "treatment date" above for today's date for further details on today's session. On eval date she indicated that in longer conversations she feels her voice does not have the same stamina it used to and "gets very weak." Lastly, she no longer reads scripture at Bible study. Pt would benefit from SLP guiding her through PhoRTE exercise regimen and also work on improved breath support. SLP may also need to include other voice exercises and techniques into her plan of care (flow phonation, VFE, SOVTE, etc) at some time.   OBJECTIVE IMPAIRMENTS: include voice disorder. These impairments are limiting patient from household responsibilities, ADLs/IADLs, and effectively communicating at home and in community. Factors affecting potential to achieve goals and functional outcome are  none noted .Aaron Aas Patient will benefit from skilled SLP services to address above impairments and improve overall function.  REHAB POTENTIAL: Excellent  PLAN:  SLP FREQUENCY: 1-2x/week  SLP DURATION: 8 weeks  PLANNED INTERVENTIONS:Voice exercises, Internal/external aids, Functional tasks, SLP instruction and feedback, Compensatory strategies, Patient/family education, and [address] Treatment of speech (30 or 45 min)     Mattis Featherly, CCC-SLP 07/10/2023,  5:26 PM

## 2023-07-10 NOTE — Patient Instructions (Signed)
 There are three main steps to the VFE protocol. They are to be performed with two repetitions, twice a day. USE YOUR ABDOMINAL BREATHING FOR EACH REP. USE A STRONG/SOFT VOICE for each rep.  1. The first step (music note F) is a vocal warm-up - on the vowel "eeee". Say this in a strong soft voice, as long as you can, with a nasal tone.  2. During the second step of VFE, use your belly breath to start a glide as low of a note as you can to as high as you can saying the word "knoll" with a strong soft voice. Remember your "megaphone" with your lips as the small part and your mouth and throat as the wide part.  Then use your belly breath to start a glide as high as you can to as low as you can saying the word "knoll" with a strong soft voice. Remember your "megaphone".  3. The third step of VFE, Hold "ohhhhhh" for as long as possible with a strong soft voice and focused tone with no breathiness. Start on music note "C" and do two reps on C, then D, then E, then F, then G.

## 2023-07-12 ENCOUNTER — Ambulatory Visit: Payer: Medicare HMO | Admitting: *Deleted

## 2023-07-12 ENCOUNTER — Encounter: Payer: Self-pay | Admitting: Family Medicine

## 2023-07-12 DIAGNOSIS — Z0000 Encounter for general adult medical examination without abnormal findings: Secondary | ICD-10-CM

## 2023-07-12 NOTE — Patient Instructions (Signed)
 Robin Banks , Thank you for taking time to come for your Medicare Wellness Visit. I appreciate your ongoing commitment to your health goals. Please review the following plan we discussed and let me know if I can assist you in the future.   Screening recommendations/referrals: Colonoscopy: up to date Mammogram: up to date Bone Density: up to date Recommended yearly ophthalmology/optometry visit for glaucoma screening and checkup Recommended yearly dental visit for hygiene and checkup  Vaccinations: Influenza vaccine: up to date Pneumococcal vaccine: up to date Tdap vaccine: up to date Shingles vaccine: up to date      Preventive Care 65 Years and Older, Female Preventive care refers to lifestyle choices and visits with your health care provider that can promote health and wellness. What does preventive care include? A yearly physical exam. This is also called an annual well check. Dental exams once or twice a year. Routine eye exams. Ask your health care provider how often you should have your eyes checked. Personal lifestyle choices, including: Daily care of your teeth and gums. Regular physical activity. Eating a healthy diet. Avoiding tobacco and drug use. Limiting alcohol use. Practicing safe sex. Taking low-dose aspirin every day. Taking vitamin and mineral supplements as recommended by your health care provider. What happens during an annual well check? The services and screenings done by your health care provider during your annual well check will depend on your age, overall health, lifestyle risk factors, and family history of disease. Counseling  Your health care provider may ask you questions about your: Alcohol use. Tobacco use. Drug use. Emotional well-being. Home and relationship well-being. Sexual activity. Eating habits. History of falls. Memory and ability to understand (cognition). Work and work Astronomer. Reproductive health. Screening  You may have  the following tests or measurements: Height, weight, and BMI. Blood pressure. Lipid and cholesterol levels. These may be checked every 5 years, or more frequently if you are over 65 years old. Skin check. Lung cancer screening. You may have this screening every year starting at age 97 if you have a 30-pack-year history of smoking and currently smoke or have quit within the past 15 years. Fecal occult blood test (FOBT) of the stool. You may have this test every year starting at age 50. Flexible sigmoidoscopy or colonoscopy. You may have a sigmoidoscopy every 5 years or a colonoscopy every 10 years starting at age 110. Hepatitis C blood test. Hepatitis B blood test. Sexually transmitted disease (STD) testing. Diabetes screening. This is done by checking your blood sugar (glucose) after you have not eaten for a while (fasting). You may have this done every 1-3 years. Bone density scan. This is done to screen for osteoporosis. You may have this done starting at age 85. Mammogram. This may be done every 1-2 years. Talk to your health care provider about how often you should have regular mammograms. Talk with your health care provider about your test results, treatment options, and if necessary, the need for more tests. Vaccines  Your health care provider may recommend certain vaccines, such as: Influenza vaccine. This is recommended every year. Tetanus, diphtheria, and acellular pertussis (Tdap, Td) vaccine. You may need a Td booster every 10 years. Zoster vaccine. You may need this after age 53. Pneumococcal 13-valent conjugate (PCV13) vaccine. One dose is recommended after age 63. Pneumococcal polysaccharide (PPSV23) vaccine. One dose is recommended after age 59. Talk to your health care provider about which screenings and vaccines you need and how often you need them. This  information is not intended to replace advice given to you by your health care provider. Make sure you discuss any questions  you have with your health care provider. Document Released: 04/10/2015 Document Revised: 12/02/2015 Document Reviewed: 01/13/2015 Elsevier Interactive Patient Education  2017 [employer].  Fall Prevention in the Home Falls can cause injuries. They can happen to people of all ages. There are many things you can do to make your home safe and to help prevent falls. What can I do on the outside of my home? Regularly fix the edges of walkways and driveways and fix any cracks. Remove anything that might make you trip as you walk through a door, such as a raised step or threshold. Trim any bushes or trees on the path to your home. Use bright outdoor lighting. Clear any walking paths of anything that might make someone trip, such as rocks or tools. Regularly check to see if handrails are loose or broken. Make sure that both sides of any steps have handrails. Any raised decks and porches should have guardrails on the edges. Have any leaves, snow, or ice cleared regularly. Use sand or salt on walking paths during winter. Clean up any spills in your garage right away. This includes oil or grease spills. What can I do in the bathroom? Use night lights. Install grab bars by the toilet and in the tub and shower. Do not use towel bars as grab bars. Use non-skid mats or decals in the tub or shower. If you need to sit down in the shower, use a plastic, non-slip stool. Keep the floor dry. Clean up any water that spills on the floor as soon as it happens. Remove soap buildup in the tub or shower regularly. Attach bath mats securely with double-sided non-slip rug tape. Do not have throw rugs and other things on the floor that can make you trip. What can I do in the bedroom? Use night lights. Make sure that you have a light by your bed that is easy to reach. Do not use any sheets or blankets that are too big for your bed. They should not hang down onto the floor. Have a firm chair that has side arms. You  can use this for support while you get dressed. Do not have throw rugs and other things on the floor that can make you trip. What can I do in the kitchen? Clean up any spills right away. Avoid walking on wet floors. Keep items that you use a lot in easy-to-reach places. If you need to reach something above you, use a strong step stool that has a grab bar. Keep electrical cords out of the way. Do not use floor polish or wax that makes floors slippery. If you must use wax, use non-skid floor wax. Do not have throw rugs and other things on the floor that can make you trip. What can I do with my stairs? Do not leave any items on the stairs. Make sure that there are handrails on both sides of the stairs and use them. Fix handrails that are broken or loose. Make sure that handrails are as long as the stairways. Check any carpeting to make sure that it is firmly attached to the stairs. Fix any carpet that is loose or worn. Avoid having throw rugs at the top or bottom of the stairs. If you do have throw rugs, attach them to the floor with carpet tape. Make sure that you have a light switch at the top  of the stairs and the bottom of the stairs. If you do not have them, ask someone to add them for you. What else can I do to help prevent falls? Wear shoes that: Do not have high heels. Have rubber bottoms. Are comfortable and fit you well. Are closed at the toe. Do not wear sandals. If you use a stepladder: Make sure that it is fully opened. Do not climb a closed stepladder. Make sure that both sides of the stepladder are locked into place. Ask someone to hold it for you, if possible. Clearly mark and make sure that you can see: Any grab bars or handrails. First and last steps. Where the edge of each step is. Use tools that help you move around (mobility aids) if they are needed. These include: Canes. Walkers. Scooters. Crutches. Turn on the lights when you go into a dark area. Replace any  light bulbs as soon as they burn out. Set up your furniture so you have a clear path. Avoid moving your furniture around. If any of your floors are uneven, fix them. If there are any pets around you, be aware of where they are. Review your medicines with your doctor. Some medicines can make you feel dizzy. This can increase your chance of falling. Ask your doctor what other things that you can do to help prevent falls. This information is not intended to replace advice given to you by your health care provider. Make sure you discuss any questions you have with your health care provider. Document Released: 01/08/2009 Document Revised: 08/20/2015 Document Reviewed: 04/18/2014 Elsevier Interactive Patient Education  2017 [employer].

## 2023-07-12 NOTE — Progress Notes (Signed)
 Subjective:   Robin Banks is a 68 y.o. female who presents for Medicare Annual (Subsequent) preventive examination.  Visit Complete: Virtual I connected with  Robin Banks on 07/12/23 by a audio enabled telemedicine application and verified that I am speaking with the correct person using two identifiers.  Patient Location: Home  Provider Location: Home Office  I discussed the limitations of evaluation and management by telemedicine. The patient expressed understanding and agreed to proceed.  Vital Signs: Because this visit was a virtual/telehealth visit, some criteria may be missing or patient reported. Any vitals not documented were not able to be obtained and vitals that have been documented are patient reported.  Cardiac Risk Factors include: advanced age (>15men, >17 women);family history of premature cardiovascular disease     Objective:    There were no vitals filed for this visit. There is no height or weight on file to calculate BMI.     07/12/2023    3:15 PM 06/29/2022    3:23 PM 05/13/2020    3:47 PM  Advanced Directives  Does Patient Have a Medical Advance Directive? Yes Yes Yes  Type of Estate agent of [employer] Power of [place];Living will Healthcare Power of [place];Living will  Copy of Healthcare Power of Attorney in Chart? No - copy requested No - copy requested No - copy requested    Current Medications (verified) Outpatient Encounter Medications as of 07/12/2023  Medication Sig   albuterol (VENTOLIN HFA) 108 (90 Base) MCG/ACT inhaler Inhale 2 puffs into the lungs every 4 (four) hours as needed for wheezing or shortness of breath (coughing fits).   BIOTIN PO Take by mouth.   budesonide-formoterol (SYMBICORT) 80-4.5 MCG/ACT inhaler Inhale 2 puffs into the lungs in the morning and at bedtime. with spacer and rinse mouth afterwards.   Calcium Carb-Cholecalciferol (CALCIUM PLUS VITAMIN D3 PO) Take by mouth daily.    cephALEXin (KEFLEX) 250 MG capsule Take 250 mg by mouth daily.   famotidine (PEPCID) 20 MG tablet Take 1 tablet (20 mg total) by mouth 2 (two) times daily.   fluticasone (FLONASE) 50 MCG/ACT nasal spray Place 2 sprays into both nostrils daily.   ipratropium (ATROVENT) 0.06 % nasal spray Place 2 sprays into both nostrils 4 (four) times daily.   levocetirizine (XYZAL) 5 MG tablet Take 1 tablet (5 mg total) by mouth every evening.   MAGNESIUM PO Take by mouth.   montelukast (SINGULAIR) 10 MG tablet Take 1 tablet (10 mg total) by mouth at bedtime.   Multiple Vitamin (MULTIVITAMIN PO) Take by mouth daily.   OMEPRAZOLE MAGNESIUM PO Take by mouth.   traZODone (DESYREL) 150 MG tablet Take 1 tablet (150 mg total) by mouth at bedtime.   Vibegron (GEMTESA PO) Take by mouth.   atorvastatin (LIPITOR) 20 MG tablet Take 1 tablet (20 mg total) by mouth daily.   No facility-administered encounter medications on file as of 07/12/2023.    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 Sherron Monday   CARPAL TUNNEL RELEASE Bilateral 2014   CATARACT EXTRACTION  12/2020   CATARACT EXTRACTION  01/2021   COLONOSCOPY     TUBAL LIGATION Bilateral 1978   Family History  Problem Relation Age of Onset   Hypertension Father    CAD Father    Hearing loss Father    Heart disease Father    COPD Father     Hyperlipidemia Mother    Arthritis Mother    Alcohol abuse Paternal Grandfather    Heart attack Paternal Grandfather    CAD Paternal Grandfather    Dementia Paternal Grandmother    Aortic aneurysm Maternal Grandmother    Cerebral palsy Son        Mild CP by records   Kidney nephrosis Son        FSG, kidney transplant.    Colon cancer Neg Hx    Esophageal cancer Neg Hx    Stomach cancer Neg Hx    Rectal cancer Neg Hx    Allergic rhinitis Neg Hx    Asthma Neg Hx    Eczema Neg Hx    Urticaria Neg Hx    Social History   Socioeconomic History   Marital status: Married    Spouse name: Not on file   Number of children: 2   Years of education: Not on file   Highest education level: Not on file  Occupational History   Occupation: Oncologist  Tobacco Use   Smoking status: Never   Smokeless tobacco: Never  Vaping Use   Vaping status: Never Used  Substance and Sexual Activity   Alcohol use: Yes    Alcohol/week: 1.0 standard drink of alcohol    Types: 1 Glasses of wine per week   Drug use: No   Sexual activity: Yes    Partners: Male    Comment: married  Other Topics Concern   Not on file  Social History Narrative   Married to Robin Banks). Three children Robin Banks and Robin Banks.    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    Takes herbal remedies, daily vitamin,    Wears seatbelt   Exercises 3x a week   Smoke detector in the home.    Firearms in the home (locked)   Feels safe in relationship.    Social Drivers of Corporate investment banker Strain: Low Risk  (07/12/2023)   Overall Financial Resource Strain (CARDIA)    Difficulty of Paying Living Expenses: Not hard at all  Food Insecurity: No Food Insecurity (07/12/2023)   Hunger Vital Sign    Worried About Running Out of Food in the Last Year: Never true    Ran Out of Food in the Last Year: Never true  Transportation Needs: No Transportation Needs (07/12/2023)   PRAPARE - Scientist, research (physical sciences) (Medical): No    Lack of Transportation (Non-Medical): No  Physical Activity: Sufficiently Active (07/12/2023)   Exercise Vital Sign    Days of Exercise per Week: 5 days    Minutes of Exercise per Session: 60 min  Stress: No Stress Concern Present (07/12/2023)   [employer] of Occupational Health - Occupational Stress Questionnaire    Feeling of Stress : Only a little  Social Connections: Socially Integrated (07/12/2023)  Social Advertising account executive [NHANES]    Frequency of Communication with Friends and Family: More than three times a week    Frequency of Social Gatherings with Friends and Family: More than three times a week    Attends Religious Services: More than 4 times per year    Active Member of [employer] or Organizations: Yes    Attends Banker Meetings: 1 to 4 times per year    Marital Status: Married    Tobacco Counseling Counseling given: Not Answered   Clinical Intake:  Pre-visit preparation completed: Yes  Pain : No/denies pain     Diabetes: No  How often do you need to have someone help you when you read instructions, pamphlets, or other written materials from your doctor or pharmacy?: 1 - Never  Interpreter Needed?: No  Information entered by :: Remi Haggard LPN   Activities of Daily Living    07/12/2023    3:21 PM  In your present state of health, do you have any difficulty performing the following activities:  Hearing? 1  Vision? 0  Difficulty concentrating or making decisions? 0  Walking or climbing stairs? 0  Dressing or bathing? 0  Doing errands, shopping? 0  Preparing Food and eating ? N  Using the Toilet? N  In the past six months, have you accidently leaked urine? Y  Do you have problems with loss of bowel control? N  Managing your Medications? N  Managing your Finances? N  Housekeeping or managing your Housekeeping? N    Patient Care Team: Natalia Leatherwood, DO as PCP - General (Family  Medicine) Richardean Chimera, MD as Consulting Physician (Obstetrics and Gynecology) Nyoka Cowden, MD as Consulting Physician (Pulmonary Disease) Danis, Andreas Blower, MD as Consulting Physician (Gastroenterology)  Indicate any recent Medical Services you may have received from other than [facility] providers in the past year (date may be approximate).     Assessment:   This is a routine wellness examination for Yoselyn.  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 0 0 0 0 0  Number falls in past yr: 0 0 0 0 0  Injury with Fall? 0 0 0 0 0  Risk for fall due to :  No Fall Risks No Fall Risks    Follow up Falls evaluation completed;Education provided;Falls prevention discussed Falls evaluation completed Falls evaluation completed Falls  evaluation completed Falls evaluation completed    MEDICARE RISK AT HOME: Medicare Risk at Home Any stairs in or around the home?: Yes If so, are there any without handrails?: No Home free of loose throw rugs in walkways, pet beds, electrical cords, etc?: Yes Adequate lighting in your home to reduce risk of falls?: Yes Life alert?: No Use of a cane, walker or w/c?: No Grab bars in the bathroom?: No Shower chair or bench in shower?: Yes Elevated toilet seat or a handicapped  toilet?: No  TIMED UP AND GO:  Was the test performed?  No    Cognitive Function:        07/12/2023    3:16 PM 06/29/2022    3:25 PM 06/15/2021    3:02 PM  6CIT Screen  What Year? 0 points 0 points 0 points  What month? 0 points 0 points 0 points  What time? 0 points 0 points 0 points  Count back from 20 0 points 0 points 0 points  Months in reverse 0 points 0 points 0 points  Repeat phrase 0 points 0 points 0 points  Total Score 0 points 0 points 0 points    Immunizations Immunization History  Administered Date(s) Administered   Fluad Quad(high Dose 65+) 12/05/2018, 01/10/2020, 12/26/2021   Fluad Trivalent(High Dose 65+) 11/08/2022   Influenza,inj,Quad PF,6+ Mos 03/24/2016, 01/24/2017, 12/04/2017   Influenza-Unspecified 12/26/2013, 01/24/2017, 12/26/2020   PFIZER(Purple Top)SARS-COV-2 Vaccination 06/02/2019, 07/02/2019, 01/28/2020   Pneumococcal Conjugate-13 02/03/2020   Pneumococcal Polysaccharide-23 11/20/2012, 02/23/2021   Td 04/29/2020   Tdap 02/03/2009, 04/30/2020   Zoster Recombinant(Shingrix) 03/09/2020, 06/15/2020   Zoster, Live 03/15/2013    TDAP status: Up to date  Flu Vaccine status: Up to date  Pneumococcal vaccine status: Up to date  Covid-19 vaccine status: Information provided on how to obtain vaccines.   Qualifies for Shingles Vaccine? No   Zostavax completed Yes   Shingrix Completed?: Yes  Screening Tests Health Maintenance  Topic Date Due   INFLUENZA VACCINE  10/27/2023   MAMMOGRAM  07/06/2024   Medicare Annual Wellness (AWV)  07/11/2024   DEXA SCAN  05/10/2025   Colonoscopy  05/22/2029   DTaP/Tdap/Td (4 - Td or Tdap) 04/30/2030   Pneumonia Vaccine 46+ Years old  Completed   Hepatitis C Screening  Completed   Zoster Vaccines- Shingrix  Completed   HPV VACCINES  Aged Out   Meningococcal B Vaccine  Aged Out   COVID-19 Vaccine  Discontinued    Health Maintenance  There are no preventive care reminders to display for this  patient.   Colorectal cancer screening: Type of screening: Colonoscopy. Completed 2021. Repeat every 10 years  Mammogram status: Completed  . Repeat every year  Bone Density status: Completed 2024. Results reflect: Bone density results: OSTEOPENIA. Repeat every 3 years.  Lung Cancer Screening: (Low Dose CT Chest recommended if Age 41-80 years, 20 pack-year currently smoking OR have quit w/in 15years.) does not qualify.   Lung Cancer Screening Referral:   Additional Screening:  Hepatitis C Screening: does not qualify; Completed 2021  Vision Screening: Recommended annual ophthalmology exams for early detection of glaucoma and other disorders of the eye. Is the patient up to date with their annual eye exam?  Yes  Who is the provider or what is the name of the office in which the patient attends annual eye exams? [facility] If pt is not established with a provider, would they like to be referred to a provider to establish care? No .  Dental Screening: Recommended annual dental exams for proper oral hygiene    Community Resource Referral / Chronic Care Management: CRR required this visit?  No   CCM required this visit?  No     Plan:     I have personally reviewed and noted the following in the patient's chart:   Medical and social history Use of alcohol, tobacco or illicit drugs  Current medications and supplements including opioid prescriptions. Patient is not currently taking opioid prescriptions. Functional ability and status Nutritional status Physical activity Advanced directives List of other physicians Hospitalizations, surgeries, and ER visits in previous 12 months Vitals Screenings to include cognitive, depression, and falls Referrals and appointments  In addition, I have reviewed and discussed with patient certain preventive protocols, quality metrics, and best practice recommendations. A written personalized care plan for preventive services as well as general  preventive health recommendations were provided to patient.     Kieth Pelt, LPN   1/61/0960   After Visit Summary: (MyChart) Due to this being a telephonic visit, the after visit summary with patients personalized plan was offered to patient via MyChart   Nurse Notes:

## 2023-07-17 ENCOUNTER — Ambulatory Visit

## 2023-07-17 DIAGNOSIS — R490 Dysphonia: Secondary | ICD-10-CM | POA: Diagnosis not present

## 2023-07-17 NOTE — Therapy (Signed)
 OUTPATIENT SPEECH LANGUAGE PATHOLOGY VOICE TREATMENT   Patient Name: Robin Banks MRN: [id number] DOB:1955-12-10, 68 y.o., female Today's Date: 07/17/2023  PCP: Napolean Backbone, DO REFERRING PROVIDER: Artice Last, MD  END OF SESSION:  End of Session - 07/17/23 1713     Visit Number 5    Number of Visits 15    Date for SLP Re-Evaluation 07/28/23    SLP Start Time 1624    SLP Stop Time  1700    SLP Time Calculation (min) 36 min    Activity Tolerance Patient tolerated treatment well                 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 Clarke Crouch   CARPAL TUNNEL RELEASE Bilateral 2014   CATARACT EXTRACTION  12/2020   CATARACT EXTRACTION  01/2021   COLONOSCOPY     TUBAL LIGATION Bilateral 1978   Patient Active Problem List   Diagnosis Date Noted   Synovitis and tenosynovitis 08/10/2021   Bilateral carpal tunnel syndrome 02/24/2021   Seasonal and perennial allergic rhinitis 06/27/2019   Hallux rigidus, acquired, left 06/04/2018   Elevated hemoglobin A1c 04/07/2017   Major depressive disorder, recurrent episode, moderate (HCC) 01/30/2017   Postmenopausal symptoms 10/28/2015   Osteopenia 10/28/2015   Overactive bladder 10/28/2015   Insomnia 10/12/2015   Asthma 01/09/2007    Onset date: "A few years"  REFERRING DIAG: R05.3 (ICD-10-CM) - Chronic cough R49.0 (ICD-10-CM) - Dysphonia 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 No  FALLS: Has patient fallen in last 6 months? No  PATIENT GOALS: "Get rid of this (hoarse rough voice)  OBJECTIVE:  Note: Objective measures were completed at Evaluation unless otherwise noted.  DIAGNOSTIC FINDINGS:  Assessment and Plan -[place] 05/18/23 Chronic Cough Chronic cough on and off for 8 months, with recurrent episodes over the past two months. Contributing factors include hx of adult-onset asthma and hx of GERD and post-nasal drip. Exam including flexible laryngoscopy and nasal endoscopy without masses or lesions, normal VF mobility. She had changes c/w GERD LPR and VF atrophy with incomplete glottic closure. She reports allergy testing which was positive for dust mites in the past, several years ago. Differential diagnosis includes asthma, GERD, postnasal drainage, and neurogenic cough. Relief with prednisone  in the past raises suspicion for pulmonary causes, although post-nasal drainage could also respond to steroids. Discussed potential causes and management options, including Pepcid  and Reflux Gourmet for reflux. - Order chest x-ray  - Start Pepcid  BID 20 mg  - Recommend Reflux Gourmet after meals  and at night - Continue current inhalers and nasal sprays/Singulair  and Xyzal   - Referral to pulmonary for further evaluation and pulmonary function tests - Referral to speech therapy for voice therapy and cough suppression techniques   Chronic Gastroesophageal Reflux Disease (GERD) GERD with inconsistent omeprazole use. Reflux changes observed on scope exam, likely contributing to chronic cough and hoarseness. Discussed dietary and lifestyle modifications. 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 Discussed voice therapy benefits - Referral to speech therapy for voice therapy and for chronic cough    Chronic nasal congestion Environmental Allergies Post-Nasal Drip - continue Xyzal  5 mg daily Singulair  10 mg daily  - continue Flonase  2 puffs b/l nares BID  - continue Atrovent  2 puffs b/l nares BID - will consider repeating allergy testing in the future    Follow-up - Schedule follow-up appointment in a few months - Call with chest x-ray results - Schedule appointments with speech therapy and pulmonary as soon as possible.    PATIENT REPORTED OUTCOME MEASURES (PROM): V-RQOL: provided 07/03/23. Returned with scoring herself 20 raw score, and V-RQOL score of 75, correlating to fair to good QOL. She rated her voice of "fair" quality in the last two weeks.                                                                                                                             TREATMENT DATE:  Phonatory Resistance Training Exercises=PhoRTE, Vocal function exercises=VFE, Semi-occluded vocal tract exercises=SOVTE  07/17/23: Pulmonary appointment mid-May. Pt c/o dryness and more congestion today, as well as incr'd hoarseness - was outside a lot over the weekend. Pt had minimal voice use today at work. Min A consistently with PhoRTE ("ah" instead of "aw", glides instead of arpeggios, and AB for all three tasks), and mod I with VFE.  07/10/23: Pt brought in a Breath as Prayer book to show SLP. SLP suggested she meditate with the breath prayers as she practices AB, as she has had difficulty with practicing AB. SLP reviewed PhoRTE with pt today; she did not adhere to frequency over the  weekend due to unexpected plans. SLP needed to provide A for using AB for each part of PhoRTE, and for strong voice with numbers.  SLP introduced VFE with pt today and she needed max A but was mod I by the end of session. See "pt instructions". SLP told pt that she would need to cont exercises with 2-3x/week frequency after ST d/c.  07/07/23:  PhoRTE practice today guided by SLP - [place] needed cues from SLP for procedure with exercise #2 (keep strong voice) and #3 (10 high pitched, then 10 low pitched and not  alternate high low, and lower pitch on low-pitch sentences). SLP cont'd teaching AB to pt and she demonstrated AB approx 85% at rest. Homework for 12-15 minutes AB BID.     07/03/23: SLP began with introducing AB with pt. At rest she was approx 75% successful with AB. SLP introduced PhoRTE exercise #2 and #3; she req'd occasional min A faded to modified independent. SLP used [employer] provided with AVS today as well as verbal and demo cues from SLP.  06/21/23: Education below provided, benefits of voice therapy, SLP introduced pt and trained her with PhoRTE exercise x1 (/a/).  PATIENT EDUCATION: Education details: See "treatment date" Person educated: Patient Education method: Explanation, Demonstration, and Verbal cues Education comprehension: verbalized understanding, returned demonstration, verbal cues required, and needs further education  HOME EXERCISE PROGRAM: PhoRTE exercises, AB, VFE  GOALS: Goals reviewed with patient? Yes in general  SHORT TERM GOALS: Target date: 07/28/23 (due to first visit week of 07/03/23)  Pt will complete PROM Baseline: Goal status: Met  2.  Pt will complete PhoRTE exercises with rare min A in 2 sessions Baseline:  Goal status: INITIAL  3.  Pt will report (subjectively) greater vocal stamina than prior to ST Baseline:  Goal status: INITIAL  4.  Pt will demo AB 80% in 5 minutes simple conversation  Baseline:  Goal status: INITIAL  5.  Pt will  demo WNL voice quality in 3 minutes conversation Baseline:  Goal status: INITIAL   LONG TERM GOALS: Target date: 08/25/23  Pt will improve V-RQOL score Baseline:  Goal status: INITIAL  2.  Pt will demo WNL voice quality 85% of the time in 10 minutes simple conversation in 2 sessions Baseline:  Goal status: INITIAL  3.  Pt will demo AB 80% in 10 minutes simple conversation Baseline:  Goal status: INITIAL  4.  Pt will complete PhoRTE exercises with mod independence in 2 sessions Baseline:  Goal status: INITIAL  5.  Pt will indicate she is comfortable reading aloud for Bible study Baseline:  Goal status: INITIAL   ASSESSMENT:  CLINICAL IMPRESSION: Patient is a 68 y.o. 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 Today she worked with SLP on honing procedure with PhoRTE exercises and continued to work with SLP with AB for breathing at rest. VFE also completed today to further work with vocal fold strengthening/bulking. See "treatment date" above for today's date for further details on today's session. On eval date she indicated that in longer conversations she feels her voice does not have the same stamina it used to and "gets very weak." Lastly, she no longer reads scripture at Bible study. Pt would benefit from SLP guiding her through PhoRTE exercise regimen and also work on improved breath support. SLP may also need to include other voice exercises and techniques into her plan of care (flow phonation, VFE, SOVTE, etc) at some time.   OBJECTIVE IMPAIRMENTS: include voice disorder. These impairments are limiting patient from household responsibilities, ADLs/IADLs, and effectively communicating at home and in community. Factors affecting potential to achieve goals and functional outcome are  none noted .Aaron Aas Patient will benefit from skilled SLP services to address above impairments and improve overall function.  REHAB POTENTIAL:  Excellent  PLAN:  SLP FREQUENCY: 1-2x/week  SLP DURATION: 8 weeks  PLANNED INTERVENTIONS:Voice exercises, Internal/external aids, Functional tasks, SLP instruction and feedback, Compensatory strategies, Patient/family education, and [id number] Treatment of speech (30 or 45 min)  Lupe Handley, CCC-SLP 07/17/2023, 5:14 PM

## 2023-07-18 ENCOUNTER — Other Ambulatory Visit (INDEPENDENT_AMBULATORY_CARE_PROVIDER_SITE_OTHER): Payer: Self-pay | Admitting: Otolaryngology

## 2023-07-19 ENCOUNTER — Ambulatory Visit

## 2023-07-19 DIAGNOSIS — R490 Dysphonia: Secondary | ICD-10-CM

## 2023-07-19 NOTE — Therapy (Signed)
 OUTPATIENT SPEECH LANGUAGE PATHOLOGY VOICE TREATMENT   Patient Name: Robin Banks MRN: [id number] DOB:08-15-1955, 68 y.o., female Today's Date: 07/19/2023  PCP: Napolean Backbone, DO REFERRING PROVIDER: Artice Last, MD  END OF SESSION:  End of Session - 07/19/23 1648     Visit Number 6    Number of Visits 15    Date for SLP Re-Evaluation 07/28/23    SLP Start Time 1534    SLP Stop Time  1604    SLP Time Calculation (min) 30 min    Activity Tolerance Patient tolerated treatment well                  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 Clarke Crouch   CARPAL TUNNEL RELEASE Bilateral 2014   CATARACT EXTRACTION  12/2020   CATARACT EXTRACTION  01/2021   COLONOSCOPY     TUBAL LIGATION Bilateral 1978   Patient Active Problem List   Diagnosis Date Noted   Synovitis and tenosynovitis 08/10/2021   Bilateral carpal tunnel syndrome 02/24/2021   Seasonal and perennial allergic rhinitis 06/27/2019   Hallux rigidus, acquired, left 06/04/2018   Elevated hemoglobin A1c 04/07/2017   Major depressive disorder, recurrent episode, moderate (HCC) 01/30/2017   Postmenopausal symptoms 10/28/2015   Osteopenia 10/28/2015   Overactive bladder 10/28/2015   Insomnia 10/12/2015   Asthma 01/09/2007    Onset date: "A few years"  REFERRING DIAG: R05.3 (ICD-10-CM) - Chronic cough R49.0 (ICD-10-CM) - Dysphonia 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 No  FALLS: Has patient fallen in last 6 months? No  PATIENT GOALS: "Get rid of this (hoarse rough voice)  OBJECTIVE:  Note: Objective measures were completed at Evaluation unless otherwise noted.  DIAGNOSTIC FINDINGS:  Assessment and Plan -Robin Banks 05/18/23 Chronic Cough Chronic cough on and off for 8 months, with recurrent episodes over the past two months. Contributing factors include hx of adult-onset asthma and hx of GERD and post-nasal drip. Exam including flexible laryngoscopy and nasal endoscopy without masses or lesions, normal VF mobility. She had changes c/w GERD LPR and VF atrophy with incomplete glottic closure. She reports allergy testing which was positive for dust mites in the past, several years ago. Differential diagnosis includes asthma, GERD, postnasal drainage, and neurogenic cough. Relief with prednisone  in the past raises suspicion for pulmonary causes, although post-nasal drainage could also respond to steroids. Discussed potential causes and management options, including Pepcid  and Reflux Gourmet for reflux. - Order chest x-ray  - Start Pepcid  BID 20 mg  - Recommend Reflux Gourmet after  meals and at night - Continue current inhalers and nasal sprays/Singulair  and Xyzal   - Referral to pulmonary for further evaluation and pulmonary function tests - Referral to speech therapy for voice therapy and cough suppression techniques   Chronic Gastroesophageal Reflux Disease (GERD) GERD with inconsistent omeprazole use. Reflux changes observed on scope exam, likely contributing to chronic cough and hoarseness. Discussed dietary and lifestyle modifications. 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 Discussed voice therapy benefits - Referral to speech therapy for voice therapy and for chronic cough    Chronic nasal congestion Environmental Allergies Post-Nasal Drip - continue Xyzal  5 mg daily Singulair  10 mg daily  - continue Flonase  2 puffs b/l nares BID  - continue Atrovent  2 puffs b/l nares BID - will consider repeating allergy testing in the future    Follow-up - Schedule follow-up appointment in a few months - Call with chest x-ray results - Schedule appointments with speech therapy and pulmonary as soon as possible.    PATIENT REPORTED OUTCOME MEASURES (PROM): V-RQOL: provided 07/03/23. Returned with scoring herself 20 raw score, and V-RQOL score of 75, correlating to fair to good QOL. She rated her voice of "fair" quality in the last two weeks.                                                                                                                             TREATMENT DATE:  Phonatory Resistance Training Exercises=PhoRTE, Vocal function exercises=VFE, Semi-occluded vocal tract exercises=SOVTE  07/19/23: Pt with moderate voice use today at work. HEP completed with suboptimal frequency. SLP stressed need to complete more often for best results. SLP told pt if she had to pick one to do BID rather than none, PhoRTE was likely the one to do twice. Robin Banks occasional min-mod cues for PhoRTE for "ah" instead of "aw", glides with numbers instead of arpeggios, and a strong voice (same as with sentences) with numbers. With VFE, pt Banks cues for lo-hi instead of lo-hi-lo. AB was excellent in exercises today.  07/17/23: Pulmonary appointment mid-May. Pt c/o dryness and more  congestion today, as well as incr'd hoarseness - was outside a lot over the weekend. Pt had minimal voice use today at work. Min A consistently with PhoRTE ("ah" instead of "aw", glides instead of arpeggios, and AB for all three tasks), and mod I with VFE.  07/10/23: Pt brought in a Breath as Prayer book to show SLP. SLP suggested she meditate with the breath prayers as she practices AB, as she has had difficulty with practicing AB. SLP reviewed PhoRTE with pt today; she did not adhere to frequency over the weekend due to unexpected plans.  SLP needed to provide A for using AB for each part of PhoRTE, and for strong voice with numbers.  SLP introduced VFE with pt today and she needed max A but was mod I by the end of session. See "pt instructions". SLP told pt that she would need to cont exercises with 2-3x/week frequency after ST d/c.  07/07/23:  PhoRTE practice today guided by SLP - Robin Banks needed cues from SLP for procedure with exercise #2 (keep strong voice) and #3 (10 high pitched, then 10 low pitched and not alternate high low, and lower pitch on low-pitch sentences). SLP cont'd teaching AB to pt and she demonstrated AB approx 85% at rest. Homework for 12-15 minutes AB BID.     07/03/23: SLP began with introducing AB with pt. At rest she was approx 75% successful with AB. SLP introduced PhoRTE exercise #2 and #3; she Banks occasional min A faded to modified independent. SLP used [employer] provided with AVS today as well as verbal and demo cues from SLP.  06/21/23: Education below provided, benefits of voice therapy, SLP introduced pt and trained her with PhoRTE exercise x1 (/a/).  PATIENT EDUCATION: Education details: See "treatment date" Person educated: Patient Education method: Explanation, Demonstration, and Verbal cues Education comprehension: verbalized understanding, returned demonstration, verbal cues required, and needs further education  HOME EXERCISE PROGRAM: PhoRTE exercises, AB,  VFE  GOALS: Goals reviewed with patient? Yes in general  SHORT TERM GOALS: Target date: 07/28/23 (due to first visit week of 07/03/23)  Pt will complete PROM Baseline: Goal status: Met  2.  Pt will complete PhoRTE exercises with rare min A in 2 sessions Baseline:  Goal status: INITIAL  3.  Pt will report (subjectively) greater vocal stamina than prior to ST Baseline:  Goal status: INITIAL  4.  Pt will demo AB 80% in 5 minutes simple conversation  Baseline:  Goal status: INITIAL  5.  Pt will demo WNL voice quality in 3 minutes conversation Baseline:  Goal status: INITIAL   LONG TERM GOALS: Target date: 08/25/23  Pt will improve V-RQOL score Baseline:  Goal status: INITIAL  2.  Pt will demo WNL voice quality 85% of the time in 10 minutes simple conversation in 2 sessions Baseline:  Goal status: INITIAL  3.  Pt will demo AB 80% in 10 minutes simple conversation Baseline:  Goal status: INITIAL  4.  Pt will complete PhoRTE exercises with mod independence in 2 sessions Baseline:  Goal status: INITIAL  5.  Pt will indicate she is comfortable reading aloud for Bible study Baseline:  Goal status: INITIAL   ASSESSMENT:  CLINICAL IMPRESSION: Patient is a 68 y.o. 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 Today she cont'd work with SLP on honing procedure with PhoRTE exercises, and VFE also completed today to further work with vocal fold strengthening/bulking. She used AB at rest and with exercises with excellent success. See "treatment date" above for today's date for further details on today's session. On eval date she indicated that in longer conversations she feels her voice does not have the same stamina it used to and "gets very weak." Lastly, she no longer reads scripture at Bible study. Pt would benefit from SLP guiding her through PhoRTE exercise regimen and also work on improved breath support. SLP may also need to  include other voice exercises and techniques into her plan of care (flow phonation, VFE, SOVTE, etc) at some time.  OBJECTIVE IMPAIRMENTS: include voice disorder. These impairments are limiting patient from household responsibilities, ADLs/IADLs, and effectively communicating at home and in community. Factors affecting potential to achieve goals and functional outcome are  none noted .Robin Banks Patient will benefit from skilled SLP services to address above impairments and improve overall function.  REHAB POTENTIAL: Excellent  PLAN:  SLP FREQUENCY: 1-2x/week  SLP DURATION: 8 weeks  PLANNED INTERVENTIONS:Voice exercises, Internal/external aids, Functional tasks, SLP instruction and feedback, Compensatory strategies, Patient/family education, and 40981 Treatment of speech (30 or 45 min)     Hyman Crossan, CCC-SLP 07/19/2023, 4:49 PM

## 2023-07-24 ENCOUNTER — Ambulatory Visit

## 2023-07-24 DIAGNOSIS — R490 Dysphonia: Secondary | ICD-10-CM

## 2023-07-24 NOTE — Therapy (Unsigned)
 OUTPATIENT SPEECH LANGUAGE PATHOLOGY VOICE TREATMENT   Patient Name: Robin Banks MRN: [id number] DOB:1955-12-05, 68 y.o., female Today's Date: 07/25/2023  PCP: Napolean Backbone, DO REFERRING PROVIDER: Artice Last, MD  END OF SESSION:  End of Session - 07/24/23 1632     Visit Number 7    Number of Visits 15    Date for SLP Re-Evaluation 07/28/23    SLP Start Time 1622    SLP Stop Time  1701    SLP Time Calculation (min) 39 min    Activity Tolerance Patient tolerated treatment well                  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 Clarke Crouch   CARPAL TUNNEL RELEASE Bilateral 2014   CATARACT EXTRACTION  12/2020   CATARACT EXTRACTION  01/2021   COLONOSCOPY     TUBAL LIGATION Bilateral 1978   Patient Active Problem List   Diagnosis Date Noted   Synovitis and tenosynovitis 08/10/2021   Bilateral carpal tunnel syndrome 02/24/2021   Seasonal and perennial allergic rhinitis 06/27/2019   Hallux rigidus, acquired, left 06/04/2018   Elevated hemoglobin A1c 04/07/2017   Major depressive disorder, recurrent episode, moderate (HCC) 01/30/2017   Postmenopausal symptoms 10/28/2015   Osteopenia 10/28/2015   Overactive bladder 10/28/2015   Insomnia 10/12/2015   Asthma 01/09/2007    Onset date: "A few years"  REFERRING DIAG: R05.3 (ICD-10-CM) - Chronic cough R49.0 (ICD-10-CM) - Dysphonia J38.3 (ICD-10-CM) - 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 No  FALLS: Has patient fallen in last 6 months? No  PATIENT GOALS: "Get rid of this (hoarse rough voice)  OBJECTIVE:  Note: Objective measures were completed at Evaluation unless otherwise noted.  DIAGNOSTIC FINDINGS:  Assessment and Plan -[place] 05/18/23 Chronic Cough Chronic cough on and off for 8 months, with recurrent episodes over the past two months. Contributing factors include hx of adult-onset asthma and hx of GERD and post-nasal drip. Exam including flexible laryngoscopy and nasal endoscopy without masses or lesions, normal VF mobility. She had changes c/w GERD LPR and VF atrophy with incomplete glottic closure. She reports allergy testing which was positive for dust mites in the past, several years ago. Differential diagnosis includes asthma, GERD, postnasal drainage, and neurogenic cough. Relief with prednisone  in the past raises suspicion for pulmonary causes, although post-nasal drainage could also respond to steroids. Discussed potential causes and management options, including Pepcid   and Reflux Gourmet for reflux. - Order chest x-ray  - Start Pepcid  BID 20 mg  - Recommend Reflux Gourmet after meals and at night - Continue current inhalers and nasal sprays/Singulair  and Xyzal   - Referral to pulmonary for further evaluation and pulmonary function tests - Referral to speech therapy for voice therapy and cough suppression techniques   Chronic Gastroesophageal Reflux Disease (GERD) GERD with inconsistent omeprazole use. Reflux changes observed on scope exam, likely  contributing to chronic cough and hoarseness. Discussed dietary and lifestyle modifications. 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 Discussed voice therapy benefits - Referral to speech therapy for voice therapy and for chronic cough    Chronic nasal congestion Environmental Allergies Post-Nasal Drip - continue Xyzal  5 mg daily Singulair  10 mg daily  - continue Flonase  2 puffs b/l nares BID  - continue Atrovent  2 puffs b/l nares BID - will consider repeating allergy testing in the future    Follow-up - Schedule follow-up appointment in a few months - Call with chest x-ray results - Schedule appointments with speech therapy and pulmonary as soon as possible.    PATIENT REPORTED OUTCOME MEASURES (PROM): V-RQOL: provided 07/03/23. Returned with scoring herself 20 raw score, and V-RQOL score of 75, correlating to fair to good QOL. She rated her voice of "fair" quality in the last two weeks.                                                                                                                             TREATMENT DATE:  Phonatory Resistance Training Exercises=PhoRTE, Vocal function exercises=VFE, Semi-occluded vocal tract exercises=SOVTE  07/24/23: Pt with WNL voice use today at work. HEP cont completed with suboptimal frequency. SLP again stressed need to complete more often for best results. Nikkie req'd occasional min-mod cues for PhoRTE for a strong voice (same as with sentences) with numbers. With VFE, pt req'd cues for lo-hi and hi-lo without stair stepping. Pt used hand motion for extra success with this. AB was excellent in exercises today. Pt stated she thought she could handle HEP on her own  for the next week so reduced frequency to once/week beginning this week.   07/19/23: Pt with moderate voice use today at work. HEP completed with suboptimal frequency. SLP stressed need to complete more often for best results. SLP told pt if she had to pick one to do BID rather than none, PhoRTE was likely the one to do twice. Rasheda req'd occasional min-mod cues for PhoRTE for "ah" instead of "aw", glides with numbers instead of arpeggios, and a strong voice (same as with sentences) with numbers. With VFE, pt req'd cues for lo-hi instead  of lo-hi-lo. AB was excellent in exercises today.  07/17/23: Pulmonary appointment mid-May. Pt c/o dryness and more congestion today, as well as incr'd hoarseness - was outside a lot over the weekend. Pt had minimal voice use today at work. Min A consistently with PhoRTE ("ah" instead of "aw", glides instead of arpeggios, and AB for all three tasks), and mod I with VFE.  07/10/23: Pt brought in a Breath as Prayer book to show SLP. SLP suggested she meditate with the breath prayers as she practices AB, as she has had difficulty with practicing AB. SLP reviewed PhoRTE with pt today; she did not adhere to frequency over the weekend due to unexpected plans. SLP needed to provide A for using AB for each part of PhoRTE, and for strong voice with numbers.  SLP introduced VFE with pt today and she needed max A but was mod I by the end of session. See "pt instructions". SLP told pt that she would need to cont exercises with 2-3x/week frequency after ST d/c.  07/07/23:  PhoRTE practice today guided by SLP - Ximena needed cues from SLP for procedure with exercise #2 (keep strong voice) and #3 (10 high pitched, then 10 low pitched and not alternate high low, and lower pitch on low-pitch sentences). SLP cont'd teaching AB to pt and she demonstrated AB approx 85% at rest. Homework for 12-15 minutes AB BID.     07/03/23: SLP began with introducing AB with pt. At rest she was approx 75%  successful with AB. SLP introduced PhoRTE exercise #2 and #3; she req'd occasional min A faded to modified independent. SLP used [employer] provided with AVS today as well as verbal and demo cues from SLP.  06/21/23: Education below provided, benefits of voice therapy, SLP introduced pt and trained her with PhoRTE exercise x1 (/a/).  PATIENT EDUCATION: Education details: See "treatment date" Person educated: Patient Education method: Explanation, Demonstration, and Verbal cues Education comprehension: verbalized understanding, returned demonstration, verbal cues required, and needs further education  HOME EXERCISE PROGRAM: PhoRTE exercises, AB, VFE  GOALS: Goals reviewed with patient? Yes in general  SHORT TERM GOALS: Target date: 07/28/23 (due to first visit week of 07/03/23)  Pt will complete PROM Baseline: Goal status: Met  2.  Pt will complete PhoRTE exercises with rare min A in 2 sessions Baseline:  Goal status: INITIAL  3.  Pt will report (subjectively) greater vocal stamina than prior to ST Baseline:  Goal status: INITIAL  4.  Pt will demo AB 80% in 5 minutes simple conversation  Baseline:  Goal status: INITIAL  5.  Pt will demo WNL voice quality in 3 minutes conversation Baseline:  Goal status: INITIAL   LONG TERM GOALS: Target date: 08/25/23  Pt will improve V-RQOL score Baseline:  Goal status: INITIAL  2.  Pt will demo WNL voice quality 85% of the time in 10 minutes simple conversation in 2 sessions Baseline:  Goal status: INITIAL  3.  Pt will demo AB 80% in 10 minutes simple conversation Baseline:  Goal status: INITIAL  4.  Pt will complete PhoRTE exercises with mod independence in 2 sessions Baseline:  Goal status: INITIAL  5.  Pt will indicate she is comfortable reading aloud for Bible study Baseline:  Goal status: INITIAL   ASSESSMENT:  CLINICAL IMPRESSION: Patient is a 68 y.o. F who was seen today for treatment of voice due to vocal fold  atrophy and glottal insufficiency. Today she cont'd work with SLP on honing procedure  with PhoRTE exercises, and VFE also completed today to further work with vocal fold strengthening/bulking. She used AB at rest and with exercises with excellent success. See "treatment date" above for today's date for further details on today's session. On eval date she indicated that in longer conversations she feels her voice does not have the same stamina it used to and "gets very weak." Lastly, she no longer reads scripture at Bible study. Pt would benefit from SLP guiding her through PhoRTE exercise regimen and also work on improved breath support. SLP may also need to include other voice exercises and techniques into her plan of care (flow phonation, VFE, SOVTE, etc) at some time.   OBJECTIVE IMPAIRMENTS: include voice disorder. These impairments are limiting patient from household responsibilities, ADLs/IADLs, and effectively communicating at home and in community. Factors affecting potential to achieve goals and functional outcome are  none noted .Aaron Aas Patient will benefit from skilled SLP services to address above impairments and improve overall function.  REHAB POTENTIAL: Excellent  PLAN:  SLP FREQUENCY: 1-2x/week  SLP DURATION: 8 weeks  PLANNED INTERVENTIONS:Voice exercises, Internal/external aids, Functional tasks, SLP instruction and feedback, Compensatory strategies, Patient/family education, and 16109 Treatment of speech (30 or 45 min)     Ari Engelbrecht, CCC-SLP 07/25/2023, 12:32 AM

## 2023-07-26 ENCOUNTER — Ambulatory Visit

## 2023-07-28 ENCOUNTER — Telehealth (INDEPENDENT_AMBULATORY_CARE_PROVIDER_SITE_OTHER): Payer: Self-pay

## 2023-07-28 NOTE — Telephone Encounter (Signed)
 Left a message for the patient to call us  back and let us  know if there was a specific location she would like to have her cxr sent to, since [facility] was a walk in facility and it was taking to long.

## 2023-07-30 ENCOUNTER — Other Ambulatory Visit: Payer: Self-pay | Admitting: Cardiovascular Disease

## 2023-07-31 ENCOUNTER — Ambulatory Visit

## 2023-08-01 ENCOUNTER — Other Ambulatory Visit: Payer: Self-pay | Admitting: Cardiovascular Disease

## 2023-08-04 ENCOUNTER — Ambulatory Visit

## 2023-08-10 ENCOUNTER — Encounter: Payer: Self-pay | Admitting: Pulmonary Disease

## 2023-08-10 ENCOUNTER — Ambulatory Visit (INDEPENDENT_AMBULATORY_CARE_PROVIDER_SITE_OTHER)

## 2023-08-10 ENCOUNTER — Ambulatory Visit: Admitting: Pulmonary Disease

## 2023-08-10 VITALS — BP 126/85 | HR 96 | Ht 61.5 in | Wt 133.0 lb

## 2023-08-10 DIAGNOSIS — R059 Cough, unspecified: Secondary | ICD-10-CM

## 2023-08-10 DIAGNOSIS — M40209 Unspecified kyphosis, site unspecified: Secondary | ICD-10-CM | POA: Diagnosis not present

## 2023-08-10 DIAGNOSIS — J4520 Mild intermittent asthma, uncomplicated: Secondary | ICD-10-CM

## 2023-08-10 DIAGNOSIS — K219 Gastro-esophageal reflux disease without esophagitis: Secondary | ICD-10-CM

## 2023-08-10 DIAGNOSIS — R053 Chronic cough: Secondary | ICD-10-CM | POA: Diagnosis not present

## 2023-08-10 NOTE — Patient Instructions (Addendum)
 Use Symbicort  inhaler 2 puffs, twice daily - rinse mouth out after each  Message us  in 2 weeks if not better, then will increase your dose of symbicort  to 160-4.5mcg 2 puffs twice daily  Use albuterol  inhaler 1-2 puffs every 4-6 hours as needed  We will schedule you for breathing tests at follow up in 3 months  We will check a chest x-ray today  We will check labs today  Follow up in 3 months

## 2023-08-10 NOTE — Progress Notes (Signed)
 Synopsis: Referred in May 2025 for cough  Subjective:   PATIENT ID: Robin Banks GENDER: female DOB: [birth year], MRN: [id number]  HPI  Chief Complaint  Patient presents with   Consult    Pt state CC - off and on for years,  greenish sputum    Robin Banks is a 68 year old woman, never smoker with GERD, hyperlipidemia and asthma who is referred to pulmonary clinic for cough.   She has had a persistent cough for six months, worsening without prednisone , which is the only treatment that completely resolves it. She uses a Symbicort  inhaler twice in the morning and as needed at night, but does not use a rescue inhaler. The cough initially improved with famotidine  for acid reflux but returned after six weeks despite continued use.  Her asthma was diagnosed in adulthood. She experiences sinus congestion and drainage, using Flonase  and ipratropium nasal sprays, as well as Xyzal  and montelukast  at night. No wheezing is present, but she produces light-colored mucus, particularly in the morning.  She has GERD, managed with famotidine . She experiences ankle swelling and shortness of breath when climbing several flights of stairs.  She has never smoked, but her father was a heavy smoker in the home during her childhood.   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   Osteopenia    Overactive bladder    TMJ (temporomandibular joint syndrome)    Urinary incontinence    Voice hoarseness 01/25/2022     Family History  Problem Relation Age of Onset   Hypertension Father    CAD Father    Hearing loss Father    Heart disease Father    COPD Father    Hyperlipidemia  Mother    Arthritis Mother    Alcohol abuse Paternal Grandfather    Heart attack Paternal Grandfather    CAD Paternal Grandfather    Dementia Paternal Grandmother    Aortic aneurysm Maternal Grandmother    Cerebral palsy Son        Mild CP by records   Kidney nephrosis Son        FSG, kidney transplant.    Colon cancer Neg Hx    Esophageal cancer Neg Hx    Stomach cancer Neg Hx    Rectal cancer Neg Hx    Allergic rhinitis Neg Hx    Asthma Neg Hx    Eczema Neg Hx    Urticaria Neg Hx      Social History   Socioeconomic History   Marital status: Married    Spouse name: Not on file   Number of children: 2   Years of education: Not on file   Highest education level: Not on file  Occupational History   Occupation: Oncologist  Tobacco Use   Smoking status: Never   Smokeless tobacco: Never  Vaping Use   Vaping status: Never Used  Substance and Sexual Activity   Alcohol use: Yes    Alcohol/week: 1.0 standard drink of alcohol    Types: 1 Glasses of wine per week  Drug use: No   Sexual activity: Yes    Partners: Male    Comment: married  Other Topics Concern   Not on file  Social History Narrative   Married to Robin Banks). Three children Robin Banks and Robin Banks.    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    Takes herbal remedies, daily vitamin,    Wears seatbelt   Exercises 3x a week   Smoke detector in the home.    Firearms in the home (locked)   Feels safe in relationship.    Social Drivers of Corporate investment banker Strain: Low Risk  (07/12/2023)   Overall Financial Resource Strain (CARDIA)    Difficulty of Paying Living Expenses: Not hard at all  Food Insecurity: No Food Insecurity (07/12/2023)   Hunger Vital Sign    Worried About Running Out of Food in the Last Year: Never true    Ran Out of Food in the Last Year: Never true  Transportation Needs: No Transportation Needs (07/12/2023)   PRAPARE - Administrator, Civil Service (Medical): No     Lack of Transportation (Non-Medical): No  Physical Activity: Sufficiently Active (07/12/2023)   Exercise Vital Sign    Days of Exercise per Week: 5 days    Minutes of Exercise per Session: 60 min  Stress: No Stress Concern Present (07/12/2023)   [employer] of Occupational Health - Occupational Stress Questionnaire    Feeling of Stress : Only a little  Social Connections: Socially Integrated (07/12/2023)   Social Connection and Isolation Panel [NHANES]    Frequency of Communication with Friends and Family: More than three times a week    Frequency of Social Gatherings with Friends and Family: More than three times a week    Attends Religious Services: More than 4 times per year    Active Member of [employer] or Organizations: Yes    Attends Banker Meetings: 1 to 4 times per year    Marital Status: Married  Catering manager Violence: Not At Risk (07/12/2023)   Humiliation, Afraid, Rape, and Kick questionnaire    Fear of Current or Ex-Partner: No    Emotionally Abused: No    Physically Abused: No    Sexually Abused: No     Allergies  Allergen Reactions   Demerol [Meperidine Hcl] Nausea And Vomiting   Nitrofurantoin Nausea Only    swelling     Outpatient Medications Prior to Visit  Medication Sig Dispense Refill   albuterol  (VENTOLIN  HFA) 108 (90 Base) MCG/ACT inhaler Inhale 2 puffs into the lungs every 4 (four) hours as needed for wheezing or shortness of breath (coughing fits). 18 g 1   atorvastatin  (LIPITOR) 20 MG tablet TAKE 1 TABLET BY MOUTH DAILY 90 tablet 3   BIOTIN PO Take by mouth.     budesonide -formoterol  (SYMBICORT ) 80-4.5 MCG/ACT inhaler Inhale 2 puffs into the lungs in the morning and at bedtime. with spacer and rinse mouth afterwards. 1 each 5   Calcium  Carb-Cholecalciferol (CALCIUM  PLUS VITAMIN D3 PO) Take by mouth daily.     famotidine  (PEPCID ) 20 MG tablet TAKE 1 TABLET BY MOUTH 2 TIMES A DAY 120 tablet 5   fluticasone  (FLONASE ) 50 MCG/ACT nasal  spray Place 2 sprays into both nostrils daily. 16 g 11   ipratropium (ATROVENT ) 0.06 % nasal spray Place 2 sprays into both nostrils 4 (four) times daily. 15 mL 12   levocetirizine (XYZAL ) 5 MG tablet Take 1 tablet (5 mg total) by mouth every  evening. 90 tablet 3   MAGNESIUM PO Take by mouth.     montelukast  (SINGULAIR ) 10 MG tablet Take 1 tablet (10 mg total) by mouth at bedtime. 90 tablet 2   Multiple Vitamin (MULTIVITAMIN PO) Take by mouth daily.     traZODone  (DESYREL ) 150 MG tablet Take 1 tablet (150 mg total) by mouth at bedtime. 90 tablet 1   Vibegron (GEMTESA PO) Take by mouth.     cephALEXin (KEFLEX) 250 MG capsule Take 250 mg by mouth daily.     OMEPRAZOLE MAGNESIUM PO Take by mouth.     No facility-administered medications prior to visit.   Review of Systems  Constitutional:  Negative for chills, fever, malaise/fatigue and weight loss.  HENT:  Negative for congestion, sinus pain and sore throat.   Eyes: Negative.   Respiratory:  Positive for cough, sputum production and shortness of breath. Negative for hemoptysis and wheezing.   Cardiovascular:  Negative for chest pain, palpitations, orthopnea, claudication and leg swelling.  Gastrointestinal:  Negative for abdominal pain, heartburn, nausea and vomiting.  Genitourinary: Negative.   Musculoskeletal:  Negative for joint pain and myalgias.  Skin:  Negative for rash.  Neurological:  Negative for weakness.  Endo/Heme/Allergies:  Positive for environmental allergies.  Psychiatric/Behavioral: Negative.      Objective:   Vitals:   08/10/23 1520  BP: 126/85  Pulse: 96  SpO2: 97%  Weight: 133 lb (60.3 kg)  Height: 5' 1.5" (1.562 m)    Physical Exam Constitutional:      General: She is not in acute distress.    Appearance: Normal appearance.  Eyes:     General: No scleral icterus.    Conjunctiva/sclera: Conjunctivae normal.  Cardiovascular:     Rate and Rhythm: Normal rate and regular rhythm.  Pulmonary:     Breath  sounds: No wheezing, rhonchi or rales.  Musculoskeletal:     Right lower leg: No edema.     Left lower leg: No edema.  Skin:    General: Skin is warm and dry.  Neurological:     General: No focal deficit present.     CBC    Component Value Date/Time   WBC 5.4 04/28/2023 0918   RBC 4.10 04/28/2023 0918   HGB 13.0 04/28/2023 0918   HCT 39.5 04/28/2023 0918   PLT 214.0 04/28/2023 0918   MCV 96.2 04/28/2023 0918   MCH 31.3 02/23/2021 1603   MCHC 33.0 04/28/2023 0918   RDW 13.8 04/28/2023 0918   LYMPHSABS 1.2 04/18/2022 0830   MONOABS 0.5 04/18/2022 0830   EOSABS 0.2 04/18/2022 0830   BASOSABS 0.0 04/18/2022 0830      Latest Ref Rng & Units 04/28/2023    9:18 AM 04/18/2022    8:30 AM 07/22/2021    8:55 AM  BMP  Glucose 70 - 99 mg/dL 60  93  93   BUN 6 - 23 mg/dL 15  12  18    Creatinine 0.40 - 1.20 mg/dL 1.61  0.96  0.45   Sodium 135 - 145 mEq/L 139  138  137   Potassium 3.5 - 5.1 mEq/L 4.1  3.9  3.9   Chloride 96 - 112 mEq/L 101  101  102   CO2 19 - 32 mEq/L 32  30  30   Calcium  8.4 - 10.5 mg/dL 9.3  9.0  9.2    Chest imaging:  PFT:     No data to display          Labs:  Path:  Echo:  Heart Catheterization:       Assessment & Plan:   Mild intermittent asthma without complication - Plan: CBC with Differential/Platelet, IgE, IgE, CBC with Differential/Platelet  Gastroesophageal reflux disease without esophagitis  Cough, unspecified type - Plan: Respiratory or Resp and Sputum Culture, DG Chest 2 View  Discussion: Kelcy Jagodzinski is a 68 year old woman, never smoker with GERD, hyperlipidemia and asthma who is referred to pulmonary clinic for cough.   Asthma Chronic asthma with persistent cough.  - Use Symbicort  80-4.8mcg two puffs twice daily. - If no improvement in two weeks, will increase to high dose Symbicort  (160 mcg) two puffs twice daily. - If still no improvement, consider switching to Breztri inhaler. - Order chest x-ray. - Schedule  pulmonary function tests.  Chronic cough Chronic cough likely multifactorial, with contributions from asthma, GERD, and possible allergic rhinitis. Cough improved with prednisone  and famotidine  previously. Cough produces light-colored mucus, occasionally darker, suggesting possible sinus infection. - Send home sputum culture cup for bacterial analysis. - Order complete blood count and IgE levels to evaluate for eosinophilia.  Gastroesophageal reflux disease (GERD) - Consider adding pantoprazole 40 mg in the morning if inhaler adjustments do not improve symptoms and continuing famotidine  at bedtime  Allergic rhinitis Allergic rhinitis with sinus congestion and post-nasal drip. - Continue Xyzal , montelukast , Flonase , and ipratropium nasal spray.  Follow up in 3 months  Duaine German, MD [facility] Office: [phone]   Current Outpatient Medications:    albuterol  (VENTOLIN  HFA) 108 (90 Base) MCG/ACT inhaler, Inhale 2 puffs into the lungs every 4 (four) hours as needed for wheezing or shortness of breath (coughing fits)., Disp: 18 g, Rfl: 1   atorvastatin  (LIPITOR) 20 MG tablet, TAKE 1 TABLET BY MOUTH DAILY, Disp: 90 tablet, Rfl: 3   BIOTIN PO, Take by mouth., Disp: , Rfl:    budesonide -formoterol  (SYMBICORT ) 80-4.5 MCG/ACT inhaler, Inhale 2 puffs into the lungs in the morning and at bedtime. with spacer and rinse mouth afterwards., Disp: 1 each, Rfl: 5   Calcium  Carb-Cholecalciferol (CALCIUM  PLUS VITAMIN D3 PO), Take by mouth daily., Disp: , Rfl:    famotidine  (PEPCID ) 20 MG tablet, TAKE 1 TABLET BY MOUTH 2 TIMES A DAY, Disp: 120 tablet, Rfl: 5   fluticasone  (FLONASE ) 50 MCG/ACT nasal spray, Place 2 sprays into both nostrils daily., Disp: 16 g, Rfl: 11   ipratropium (ATROVENT ) 0.06 % nasal spray, Place 2 sprays into both nostrils 4 (four) times daily., Disp: 15 mL, Rfl: 12   levocetirizine (XYZAL ) 5 MG tablet, Take 1 tablet (5 mg total) by mouth every evening.,  Disp: 90 tablet, Rfl: 3   MAGNESIUM PO, Take by mouth., Disp: , Rfl:    montelukast  (SINGULAIR ) 10 MG tablet, Take 1 tablet (10 mg total) by mouth at bedtime., Disp: 90 tablet, Rfl: 2   Multiple Vitamin (MULTIVITAMIN PO), Take by mouth daily., Disp: , Rfl:    traZODone  (DESYREL ) 150 MG tablet, Take 1 tablet (150 mg total) by mouth at bedtime., Disp: 90 tablet, Rfl: 1   Vibegron (GEMTESA PO), Take by mouth., Disp: , Rfl:

## 2023-08-11 ENCOUNTER — Ambulatory Visit (INDEPENDENT_AMBULATORY_CARE_PROVIDER_SITE_OTHER): Payer: Medicare HMO | Admitting: Otolaryngology

## 2023-08-11 LAB — CBC WITH DIFFERENTIAL/PLATELET
Basophils Absolute: 0 10*3/uL (ref 0.0–0.1)
Basophils Relative: 0.9 % (ref 0.0–3.0)
Eosinophils Absolute: 0.3 10*3/uL (ref 0.0–0.7)
Eosinophils Relative: 5.4 % — ABNORMAL HIGH (ref 0.0–5.0)
HCT: 38.1 % (ref 36.0–46.0)
Hemoglobin: 12.8 g/dL (ref 12.0–15.0)
Lymphocytes Relative: 29.2 % (ref 12.0–46.0)
Lymphs Abs: 1.4 10*3/uL (ref 0.7–4.0)
MCHC: 33.6 g/dL (ref 30.0–36.0)
MCV: 92.6 fl (ref 78.0–100.0)
Monocytes Absolute: 0.6 10*3/uL (ref 0.1–1.0)
Monocytes Relative: 13.2 % — ABNORMAL HIGH (ref 3.0–12.0)
Neutro Abs: 2.5 10*3/uL (ref 1.4–7.7)
Neutrophils Relative %: 51.3 % (ref 43.0–77.0)
Platelets: 218 10*3/uL (ref 150.0–400.0)
RBC: 4.12 Mil/uL (ref 3.87–5.11)
RDW: 13.4 % (ref 11.5–15.5)
WBC: 4.9 10*3/uL (ref 4.0–10.5)

## 2023-08-11 LAB — IGE: IgE (Immunoglobulin E), Serum: 282 kU/L — ABNORMAL HIGH (ref <=114)

## 2023-08-16 ENCOUNTER — Ambulatory Visit: Attending: Otolaryngology

## 2023-08-16 DIAGNOSIS — R490 Dysphonia: Secondary | ICD-10-CM | POA: Insufficient documentation

## 2023-08-16 NOTE — Therapy (Signed)
 OUTPATIENT SPEECH LANGUAGE PATHOLOGY VOICE TREATMENT/RECERTIFICATION   Patient Name: Robin Banks MRN: [id number] DOB:01/01/1956, 68 y.o., female Today's Date: 08/23/2023  PCP: Napolean Backbone, DO REFERRING PROVIDER: Artice Last, MD  END OF SESSION:      End of Session - 08/17/23 0150       Visit Number 8     Number of Visits 15     Date for SLP Re-Evaluation 09/15/23     SLP Start Time 1623     SLP Stop Time  1659     SLP Time Calculation (min) 36 min     Activity Tolerance Patient tolerated treatment well          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 Clarke Crouch   CARPAL TUNNEL RELEASE Bilateral 2014   CATARACT EXTRACTION  12/2020   CATARACT EXTRACTION  01/2021   COLONOSCOPY     TUBAL LIGATION Bilateral 1978   Patient Active Problem List   Diagnosis Date Noted   Synovitis and tenosynovitis 08/10/2021   Bilateral carpal tunnel syndrome 02/24/2021   Seasonal and perennial allergic rhinitis 06/27/2019   Hallux rigidus, acquired, left 06/04/2018   Elevated hemoglobin A1c 04/07/2017   Major depressive disorder, recurrent episode, moderate (HCC) 01/30/2017   Postmenopausal symptoms 10/28/2015   Osteopenia 10/28/2015   Overactive bladder 10/28/2015   Insomnia 10/12/2015   Asthma 01/09/2007   Speech Therapy Progress Note  Dates of Reporting Period: 06/21/23 to present  Subjective Statement: Pt has been seen  for 8 total ST sessions focusing on vocal exercises to support and strengthen the vocal folds due to vocal fold atrophy and glottal insufficiency. Pt has also worked on abdominal breathing to maximize breath control for voicing.  Objective: Unfortunately pt has not been able to demonstrate consistency with completion of VFE or PhoRTE exercises as directed by SLP, and therefore, perceptually, voice sounds much like it did on date of evaluation. Pt's abdominal breathing has improved so that she is using in conversation.  Goal Update: See below.  Plan: See pt x1/week until she can perform HEPs with modified independence and then decr to once every two weeks.  Reason Skilled Services are Required: pt has not mastered procedure for HEP, to date.   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  Pt accompanied by: self  PERTINENT HISTORY:  See above  PAIN:  Are you having pain? No  FALLS: Has patient fallen in  last 6 months? No  PATIENT GOALS: "Get rid of this (hoarse rough voice)  OBJECTIVE:  Note: Objective measures were completed at Evaluation unless otherwise noted.  DIAGNOSTIC FINDINGS:  Assessment and Plan -[place] 05/18/23 Chronic Cough Chronic cough on and off for 8 months, with recurrent episodes over the past two months. Contributing factors include hx of adult-onset asthma and hx of GERD and post-nasal drip. Exam including flexible laryngoscopy and nasal endoscopy without masses or lesions, normal VF mobility. She had changes c/w GERD LPR and VF atrophy with incomplete  glottic closure. She reports allergy testing which was positive for dust mites in the past, several years ago. Differential diagnosis includes asthma, GERD, postnasal drainage, and neurogenic cough. Relief with prednisone  in the past raises suspicion for pulmonary causes, although post-nasal drainage could also respond to steroids. Discussed potential causes and management options, including Pepcid  and Reflux Gourmet for reflux. - Order chest x-ray  - Start Pepcid  BID 20 mg  - Recommend Reflux Gourmet after meals and at night - Continue current inhalers and nasal sprays/Singulair  and Xyzal   - Referral to pulmonary for further evaluation and pulmonary function tests - Referral to speech therapy for voice therapy and cough suppression techniques   Chronic Gastroesophageal Reflux Disease (GERD) GERD with inconsistent omeprazole use. Reflux changes observed on scope exam, likely contributing to chronic cough and hoarseness. Discussed dietary and lifestyle modifications. 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 Discussed voice therapy benefits - Referral to speech therapy for voice therapy and for chronic cough    Chronic nasal congestion Environmental Allergies Post-Nasal Drip - continue Xyzal  5 mg daily Singulair  10 mg daily  - continue Flonase  2 puffs b/l nares BID  - continue Atrovent  2 puffs b/l nares BID - will consider repeating allergy testing in the future    Follow-up - Schedule follow-up appointment in a few months - Call with chest x-ray results - Schedule appointments with speech therapy and pulmonary as soon as possible.    PATIENT REPORTED OUTCOME MEASURES (PROM): V-RQOL: provided 07/03/23.  Returned with scoring herself 20 raw score, and V-RQOL score of 75, correlating to fair to good QOL. She rated her voice of "fair" quality in the last two weeks.                                                                                                                             TREATMENT DATE:  Phonatory Resistance Training Exercises=PhoRTE, Vocal function exercises=VFE, Semi-occluded vocal tract exercises=SOVTE  08/16/23:  Pt req'd usual mod A and rare min A with PhoRTE today (strength/volume of responses,  decr in pitch for low-pitch sentences, respectively), and occasional min-mod A with  VFE (slower incr and decr in pitch on "knoll"). SLP and pt agreed pt should still be seen once/week to hone accuracy with voice exercises. Pt stated she thought she could perform HEP first thing in the AM and then on her way to work. SLP encouraged this and provided rationale for HEP.  AB looked present in conversation approx 75% of the time.   07/24/23: Pt with WNL voice use today at work. HEP cont completed with suboptimal frequency. SLP again stressed need to complete more often for best results. Joelene req'd occasional min-mod cues for PhoRTE for a strong voice (same as with sentences) with numbers. With VFE, pt req'd cues for lo-hi and hi-lo without stair stepping. Pt used hand motion for extra success with this. AB was excellent in exercises today. Pt stated she thought she could handle HEP on her own for the next week so reduced frequency to once/week beginning this week.   07/19/23: Pt with moderate voice use today at work. HEP completed with suboptimal frequency. SLP stressed need to complete more often for best results. SLP told pt if she had to pick one to do BID rather than none, PhoRTE was likely the one to do twice. Eriona req'd occasional min-mod cues for PhoRTE for "ah" instead of "aw", glides with numbers instead of arpeggios, and a strong voice (same as with sentences) with numbers. With VFE, pt  req'd cues for lo-hi instead of lo-hi-lo. AB was excellent in exercises today.  07/17/23: Pulmonary appointment mid-May. Pt c/o dryness and more congestion today, as well as incr'd hoarseness - was outside a lot over the weekend. Pt had minimal voice use today at work. Min A consistently with PhoRTE ("ah" instead of "aw", glides instead of arpeggios, and AB for all three tasks), and mod I with VFE.  07/10/23: Pt brought in a Breath as Prayer book to show SLP. SLP suggested she meditate with the breath prayers as she practices AB, as she has had difficulty with practicing AB. SLP reviewed PhoRTE with pt today; she did not adhere to frequency over the weekend due to unexpected plans. SLP needed to provide A for using AB for each part of PhoRTE, and for strong voice with numbers.  SLP introduced VFE with pt today and she needed max A but was mod I by the end of session. See "pt instructions". SLP told pt that she would need to cont exercises with 2-3x/week frequency after ST d/c.  07/07/23:  PhoRTE practice today guided by SLP - Elveta needed cues from SLP for procedure with exercise #2 (keep strong voice) and #3 (10 high pitched, then 10 low pitched and not alternate high low, and lower pitch on low-pitch sentences). SLP cont'd teaching AB to pt and she demonstrated AB approx 85% at rest. Homework for 12-15 minutes AB BID.     07/03/23: SLP began with introducing AB with pt. At rest she was approx 75% successful with AB. SLP introduced PhoRTE exercise #2 and #3; she req'd occasional min A faded to modified independent. SLP used [employer] provided with AVS today as well as verbal and demo cues from SLP.  06/21/23: Education below provided, benefits of voice therapy, SLP introduced pt and trained her with PhoRTE exercise x1 (/a/).  PATIENT EDUCATION: Education details: See "treatment date" Person educated: Patient Education method: Explanation, Demonstration, and Verbal cues Education comprehension:  verbalized understanding, returned demonstration, verbal cues required, and needs further education  HOME EXERCISE PROGRAM: PhoRTE exercises, AB, VFE  GOALS:  Goals reviewed with patient? Yes in general  SHORT TERM GOALS: Target date: 07/28/23 (due to first visit week of 07/03/23)  Pt will complete PROM Baseline: Goal status: Met  2.  Pt will complete PhoRTE exercises with rare min A in 2 sessions Baseline:  Goal status: not met  3.  Pt will report (subjectively) greater vocal stamina than prior to ST Baseline:  Goal status: defer to LTGs  4.  Pt will demo AB 80% in 5 minutes simple conversation  Baseline:  Goal status: not met  5.  Pt will demo WNL voice quality in 3 minutes conversation Baseline:  Goal status: not met   LONG TERM GOALS: Target date: , 09/15/23 (due to scheduling)  Pt will improve V-RQOL score Baseline:  Goal status: INITIAL  2.  Pt will demo WNL voice quality 80% of the time in 10 minutes simple conversation in 2 sessions Baseline:  Goal status: INITIAL  3.  Pt will demo AB 80% in 10 minutes simple conversation Baseline:  Goal status: INITIAL  4.  Pt will complete PhoRTE exercises with mod independence in 2 sessions Baseline:  Goal status: INITIAL  5.  Pt will indicate she is comfortable reading aloud for Bible study Baseline:  Goal status: INITIAL  6.  Pt will report (subjectively) greater vocal stamina than prior to ST Baseline:  Goal status: moved from STGs   ASSESSMENT:  CLINICAL IMPRESSION: RECERT TODAY. Pt's STGs largely not met due to pt unable to complete HEP as prescribed. She stated she will put a stronger attempt at doing this for the remaining ST sessions. Changed LTG due date due to scheduling. LTG #3 changed. Patient is a 68 y.o. 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 Today she cont'd work with SLP on honing procedure with PhoRTE exercises, and VFE also completed today to  further work with vocal fold strengthening/bulking. AB seen in conversation today approx 70% of the time. See "treatment date" above for today's date for further details on today's session. On eval date she indicated that in longer conversations she feels her voice does not have the same stamina it used to and "gets very weak." Lastly, she no longer reads scripture at Bible study. Pt would benefit from SLP guiding her through PhoRTE exercise regimen and also work on improved breath support. SLP may also need to include other voice exercises and techniques into her plan of care (flow phonation, VFE, SOVTE, etc) at some time.   OBJECTIVE IMPAIRMENTS: include voice disorder. These impairments are limiting patient from household responsibilities, ADLs/IADLs, and effectively communicating at home and in community. Factors affecting potential to achieve goals and functional outcome are none noted.. Patient will benefit from skilled SLP services to address above impairments and improve overall function.  REHAB POTENTIAL: Excellent  PLAN:  SLP FREQUENCY: 1-2x/week  SLP DURATION: 8 weeks  PLANNED INTERVENTIONS:Voice exercises, Internal/external aids, Functional tasks, SLP instruction and feedback, Compensatory strategies, Patient/family education, and [id number] Treatment of speech (30 or 45 min)     Churchill Grimsley, CCC-SLP 08/23/2023, 4:58 PM

## 2023-08-23 ENCOUNTER — Ambulatory Visit

## 2023-08-23 DIAGNOSIS — R490 Dysphonia: Secondary | ICD-10-CM | POA: Diagnosis not present

## 2023-08-23 NOTE — Addendum Note (Signed)
 Addended by: Daphney Eans B on: 08/23/2023 05:06 PM   Modules accepted: Orders

## 2023-08-23 NOTE — Therapy (Signed)
 OUTPATIENT SPEECH LANGUAGE PATHOLOGY VOICE TREATMENT   Patient Name: Robin Banks MRN: [id number] DOB:1955-07-10, 68 y.o., female Today's Date: 08/23/2023  PCP: Napolean Backbone, DO REFERRING PROVIDER: Artice Last, MD  END OF SESSION:  End of Session - 08/23/23 1707     Visit Number 9    Number of Visits 15    Date for SLP Re-Evaluation 09/15/23    SLP Start Time 1619    SLP Stop Time  1650    SLP Time Calculation (min) 31 min    Activity Tolerance Patient tolerated treatment well                    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 Clarke Crouch   CARPAL TUNNEL RELEASE Bilateral 2014   CATARACT EXTRACTION  12/2020   CATARACT EXTRACTION  01/2021   COLONOSCOPY     TUBAL LIGATION Bilateral 1978   Patient Active Problem List   Diagnosis Date Noted   Synovitis and tenosynovitis 08/10/2021   Bilateral carpal tunnel syndrome 02/24/2021   Seasonal and perennial allergic rhinitis 06/27/2019   Hallux rigidus, acquired, left 06/04/2018   Elevated hemoglobin A1c 04/07/2017   Major depressive disorder, recurrent episode, moderate (HCC) 01/30/2017   Postmenopausal symptoms 10/28/2015   Osteopenia 10/28/2015   Overactive bladder 10/28/2015   Insomnia 10/12/2015   Asthma 01/09/2007    Onset date: "A few years"  REFERRING DIAG: R05.3 (ICD-10-CM) - Chronic cough R49.0 (ICD-10-CM) - Dysphonia J38.3 (ICD-10-CM) -  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  Pt accompanied by: self  PERTINENT HISTORY:  See above  PAIN:  Are you having pain? No  FALLS: Has patient fallen in last 6 months? No  PATIENT GOALS: "Get rid of this (hoarse rough voice)  OBJECTIVE:  Note: Objective measures were completed at Evaluation unless otherwise noted.  DIAGNOSTIC FINDINGS:  Assessment and Plan -[place] 05/18/23 Chronic Cough Chronic cough on and off for 8 months, with recurrent episodes over the past two months. Contributing factors include hx of adult-onset asthma and hx of GERD and post-nasal drip. Exam including flexible laryngoscopy and nasal endoscopy without masses or lesions, normal VF mobility. She had changes c/w GERD LPR and VF atrophy with incomplete glottic closure. She reports allergy testing which was positive for dust mites in the past, several years ago. Differential diagnosis includes asthma, GERD, postnasal drainage, and neurogenic cough. Relief with prednisone  in the past raises suspicion for pulmonary causes, although post-nasal drainage could also respond to steroids. Discussed potential causes and management options, including Pepcid  and Reflux Gourmet for reflux. - Order chest x-ray  - Start Pepcid   BID 20 mg  - Recommend Reflux Gourmet after meals and at night - Continue current inhalers and nasal sprays/Singulair  and Xyzal   - Referral to pulmonary for further evaluation and pulmonary function tests - Referral to speech therapy for voice therapy and cough suppression techniques   Chronic Gastroesophageal Reflux Disease (GERD) GERD with inconsistent omeprazole use. Reflux changes observed on scope exam, likely contributing to chronic cough and hoarseness. Discussed dietary and  lifestyle modifications. 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 Discussed voice therapy benefits - Referral to speech therapy for voice therapy and for chronic cough    Chronic nasal congestion Environmental Allergies Post-Nasal Drip - continue Xyzal  5 mg daily Singulair  10 mg daily  - continue Flonase  2 puffs b/l nares BID  - continue Atrovent  2 puffs b/l nares BID - will consider repeating allergy testing in the future    Follow-up - Schedule follow-up appointment in a few months - Call with chest x-ray results - Schedule appointments with speech therapy and pulmonary as soon as possible.    PATIENT REPORTED OUTCOME MEASURES (PROM): V-RQOL: provided 07/03/23. Returned with scoring herself 20 raw score, and V-RQOL score of 75, correlating to fair to good QOL. She rated her voice of "fair" quality in the last two weeks.                                                                                                                             TREATMENT DATE:  Phonatory Resistance Training Exercises=PhoRTE, Vocal function exercises=VFE, Semi-occluded vocal tract exercises=SOVTE  08/23/23: Pt's voice quality remains not unlike initial session with pt. Today she completed VFE with modified independence but limited hold times for /i/ and /o/ (all <5.5 seconds). With PhoRTE, pt req'd initial min A with loud/strong voice for numbers and a glide instead of arpeggio. Pt was modified independent following this. SLP and pt agreed she could decr to once every other week. SLP again stressed the need to complete HEP BID, and pt voiced understanding.  Pt demonstrated AB in 8 minutes conversation 75% of the  time  08/16/23:  Pt req'd usual mod A and rare min A with PhoRTE today (strength/volume of responses,  decr in pitch for low-pitch sentences, respectively), and occasional min-mod A with VFE (slower incr and decr in pitch on "knoll"). SLP and pt agreed pt should still be seen once/week to hone accuracy with voice exercises. Pt stated she thought she could perform HEP first thing in the AM and then on her way to work. SLP encouraged this and provided rationale for HEP.  AB looked present in conversation approx 75% of the time.   07/24/23: Pt with WNL  voice use today at work. HEP cont completed with suboptimal frequency. SLP again stressed need to complete more often for best results. Robin Banks req'd occasional min-mod cues for PhoRTE for a strong voice (same as with sentences) with numbers. With VFE, pt req'd cues for lo-hi and hi-lo without stair stepping. Pt used hand motion for extra success with this. AB was excellent in exercises today. Pt stated she thought she could handle HEP on her own for the next week so reduced frequency to once/week beginning this week.   07/19/23: Pt with moderate voice use today at work. HEP completed with suboptimal frequency. SLP stressed need to complete more often for best results. SLP told pt if she had to pick one to do BID rather than none, PhoRTE was likely the one to do twice. Robin Banks req'd occasional min-mod cues for PhoRTE for "ah" instead of "aw", glides with numbers instead of arpeggios, and a strong voice (same as with sentences) with numbers. With VFE, pt req'd cues for lo-hi instead of lo-hi-lo. AB was excellent in exercises today.  07/17/23: Pulmonary appointment mid-May. Pt c/o dryness and more congestion today, as well as incr'd hoarseness - was outside a lot over the weekend. Pt had minimal voice use today at work. Min A consistently with PhoRTE ("ah" instead of "aw", glides instead of arpeggios, and AB for all three tasks), and mod I with VFE.  07/10/23: Pt brought  in a Breath as Prayer book to show SLP. SLP suggested she meditate with the breath prayers as she practices AB, as she has had difficulty with practicing AB. SLP reviewed PhoRTE with pt today; she did not adhere to frequency over the weekend due to unexpected plans. SLP needed to provide A for using AB for each part of PhoRTE, and for strong voice with numbers.  SLP introduced VFE with pt today and she needed max A but was mod I by the end of session. See "pt instructions". SLP told pt that she would need to cont exercises with 2-3x/week frequency after ST d/c.  07/07/23:  PhoRTE practice today guided by SLP - Robin Banks needed cues from SLP for procedure with exercise #2 (keep strong voice) and #3 (10 high pitched, then 10 low pitched and not alternate high low, and lower pitch on low-pitch sentences). SLP cont'd teaching AB to pt and she demonstrated AB approx 85% at rest. Homework for 12-15 minutes AB BID.     07/03/23: SLP began with introducing AB with pt. At rest she was approx 75% successful with AB. SLP introduced PhoRTE exercise #2 and #3; she req'd occasional min A faded to modified independent. SLP used [employer] provided with AVS today as well as verbal and demo cues from SLP.  06/21/23: Education below provided, benefits of voice therapy, SLP introduced pt and trained her with PhoRTE exercise x1 (/a/).  PATIENT EDUCATION: Education details: See "treatment date" Person educated: Patient Education method: Explanation, Demonstration, and Verbal cues Education comprehension: verbalized understanding, returned demonstration, verbal cues required, and needs further education  HOME EXERCISE PROGRAM: PhoRTE exercises, AB, VFE  GOALS: Goals reviewed with patient? Yes in general  SHORT TERM GOALS: Target date: 07/28/23 (due to first visit week of 07/03/23)  Pt will complete PROM Baseline: Goal status: Met  2.  Pt will complete PhoRTE exercises with rare min A in 2 sessions Baseline:  Goal  status: not met  3.  Pt will report (subjectively) greater vocal stamina than prior to ST Baseline:  Goal status: defer  to LTGs  4.  Pt will demo AB 80% in 5 minutes simple conversation  Baseline:  Goal status: not met  5.  Pt will demo WNL voice quality in 3 minutes conversation Baseline:  Goal status: not met   LONG TERM GOALS: Target date: , 09/15/23 (due to scheduling)  Pt will improve V-RQOL score Baseline:  Goal status: INITIAL  2.  Pt will demo WNL voice quality 80% of the time in 10 minutes simple conversation in 2 sessions Baseline:  Goal status: INITIAL  3.  Pt will demo AB 80% in 10 minutes simple conversation Baseline:  Goal status: INITIAL  4.  Pt will complete PhoRTE exercises with mod independence in 2 sessions Baseline: 08/23/23 Goal status: INITIAL  5.  Pt will indicate she is comfortable reading aloud for Bible study Baseline:  Goal status: INITIAL  6.  Pt will report (subjectively) greater vocal stamina than prior to ST Baseline:  Goal status: moved from STGs   ASSESSMENT:  CLINICAL IMPRESSION: Pt's STGs largely not met due to pt unable to complete HEP as prescribed. She will put a stronger attempt at doing this for the remaining ST sessions. Changed LTG due date due to scheduling. LTG #3 changed. Patient is a 68 y.o. 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 Today she cont'd work with SLP on honing procedure with PhoRTE exercises, and VFE also completed today to further work with vocal fold strengthening/bulking. AB seen in conversation today approx 70% of the time. See "treatment date" above for today's date for further details on today's session. On eval date she indicated that in longer conversations she feels her voice does not have the same stamina it used to and "gets very weak." Lastly, she no longer reads scripture at Bible study. Pt would benefit from SLP guiding her through PhoRTE exercise  regimen and also work on improved breath support. SLP may also need to include other voice exercises and techniques into her plan of care (flow phonation, VFE, SOVTE, etc) at some time.   OBJECTIVE IMPAIRMENTS: include voice disorder. These impairments are limiting patient from household responsibilities, ADLs/IADLs, and effectively communicating at home and in community. Factors affecting potential to achieve goals and functional outcome are none noted.. Patient will benefit from skilled SLP services to address above impairments and improve overall function.  REHAB POTENTIAL: Excellent  PLAN:  SLP FREQUENCY: 1-2x/week  SLP DURATION: 8 weeks  PLANNED INTERVENTIONS:Voice exercises, Internal/external aids, Functional tasks, SLP instruction and feedback, Compensatory strategies, Patient/family education, and [address] Treatment of speech (30 or 45 min)     Marleta Lapierre, CCC-SLP 08/23/2023, 5:07 PM

## 2023-08-30 ENCOUNTER — Ambulatory Visit

## 2023-08-30 NOTE — Therapy (Incomplete)
 OUTPATIENT SPEECH LANGUAGE PATHOLOGY VOICE TREATMENT   Patient Name: Robin Banks MRN: [id number] DOB:03-07-1956, 68 y.o., female Today's Date: 08/30/2023  PCP: Napolean Backbone, DO REFERRING PROVIDER: Artice Last, MD  END OF SESSION:           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 Clarke Crouch   CARPAL TUNNEL RELEASE Bilateral 2014   CATARACT EXTRACTION  12/2020   CATARACT EXTRACTION  01/2021   COLONOSCOPY     TUBAL LIGATION Bilateral 1978   Patient Active Problem List   Diagnosis Date Noted   Synovitis and tenosynovitis 08/10/2021   Bilateral carpal tunnel syndrome 02/24/2021   Seasonal and perennial allergic rhinitis 06/27/2019   Hallux rigidus, acquired, left 06/04/2018   Elevated hemoglobin A1c 04/07/2017   Major depressive disorder, recurrent episode, moderate (HCC) 01/30/2017   Postmenopausal symptoms 10/28/2015   Osteopenia 10/28/2015   Overactive bladder 10/28/2015   Insomnia 10/12/2015   Asthma 01/09/2007    Onset date: "A few years"  REFERRING DIAG: R05.3 (ICD-10-CM) - Chronic cough R49.0 (ICD-10-CM) - Dysphonia 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  Rationale for Evaluation and Treatment: Rehabilitation  SUBJECTIVE:   SUBJECTIVE STATEMENT: Pt completed HEP as directed until Sunday - then did not complete.  Pt  accompanied by: self  PERTINENT HISTORY:  See above  PAIN:  Are you having pain? No  FALLS: Has patient fallen in last 6 months? No  PATIENT GOALS: "Get rid of this (hoarse rough voice)  OBJECTIVE:  Note: Objective measures were completed at Evaluation unless otherwise noted.  DIAGNOSTIC FINDINGS:  Assessment and Plan -[place] 05/18/23 Chronic Cough Chronic cough on and off for 8 months, with recurrent episodes over the past two months. Contributing factors include hx of adult-onset asthma and hx of GERD and post-nasal drip. Exam including flexible laryngoscopy and nasal endoscopy without masses or lesions, normal VF mobility. She had changes c/w GERD LPR and VF atrophy with incomplete glottic closure. She reports allergy testing which was positive for dust mites in the past, several years ago. Differential diagnosis includes asthma, GERD, postnasal drainage, and neurogenic cough. Relief with prednisone  in the past raises suspicion for pulmonary causes, although post-nasal drainage could also respond to steroids. Discussed potential causes and management options, including Pepcid  and Reflux Gourmet for reflux. - Order chest x-ray  - Start Pepcid  BID 20 mg  - Recommend Reflux Gourmet after meals and at night - Continue current inhalers and nasal sprays/Singulair  and Xyzal   - Referral to pulmonary for further evaluation and pulmonary function tests - Referral to speech therapy for voice therapy and cough suppression techniques   Chronic Gastroesophageal Reflux Disease (GERD) GERD with inconsistent omeprazole use. Reflux changes observed on scope exam, likely contributing to chronic cough and  hoarseness. Discussed dietary and lifestyle modifications. 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 Discussed voice therapy benefits - Referral to speech therapy for voice therapy and for chronic cough    Chronic nasal congestion Environmental Allergies Post-Nasal Drip - continue Xyzal  5 mg daily Singulair  10 mg daily  - continue Flonase  2 puffs b/l nares BID  - continue Atrovent  2 puffs b/l nares BID - will consider repeating allergy testing in the future    Follow-up - Schedule follow-up appointment in a few months - Call with chest x-ray results - Schedule appointments with speech therapy and pulmonary as soon as possible.    PATIENT REPORTED OUTCOME MEASURES (PROM): V-RQOL: provided 07/03/23. Returned with scoring herself 20 raw score, and V-RQOL score of 75, correlating to fair to good QOL. She rated her voice of "fair" quality in the last two weeks.                                                                                                                             TREATMENT DATE:  Phonatory Resistance Training Exercises=PhoRTE, Vocal function exercises=VFE, Semi-occluded vocal tract exercises=SOVTE  08/30/23:  08/23/23: Pt's voice quality remains not unlike initial session with pt. Today she completed VFE with modified independence but limited hold times for /i/ and /o/ (all <5.5 seconds). With PhoRTE, pt req'd initial min A with loud/strong voice for numbers and a glide instead of arpeggio. Pt was modified independent following this. SLP and pt agreed she could decr to once every other week. SLP again stressed the need to complete HEP BID, and pt voiced understanding.  Pt demonstrated AB in 8 minutes conversation 75% of the time  08/16/23:  Pt req'd usual mod A and rare min A with PhoRTE today (strength/volume of responses,  decr in pitch for low-pitch sentences, respectively), and occasional min-mod A with VFE (slower incr and decr in pitch on "knoll"). SLP and pt agreed pt should  still be seen once/week to hone accuracy with voice exercises. Pt stated she thought she could perform HEP first thing in the AM and then on her way to work. SLP encouraged this and provided rationale for HEP.  AB looked present in conversation approx 75% of the time.   07/24/23: Pt with WNL voice use today at work. HEP cont completed with suboptimal frequency. SLP again stressed need to complete more often for best results. Robin Banks req'd occasional min-mod cues for PhoRTE for a strong voice (same as with sentences) with numbers. With VFE, pt req'd cues for lo-hi and hi-lo without stair stepping. Pt used hand motion for extra success with this. AB was excellent in exercises today. Pt stated  she thought she could handle HEP on her own for the next week so reduced frequency to once/week beginning this week.   07/19/23: Pt with moderate voice use today at work. HEP completed with suboptimal frequency. SLP stressed need to complete more often for best results. SLP told pt if she had to pick one to do BID rather than none, PhoRTE was likely the one to do twice. Robin Banks req'd occasional min-mod cues for PhoRTE for "ah" instead of "aw", glides with numbers instead of arpeggios, and a strong voice (same as with sentences) with numbers. With VFE, pt req'd cues for lo-hi instead of lo-hi-lo. AB was excellent in exercises today.  07/17/23: Pulmonary appointment mid-May. Pt c/o dryness and more congestion today, as well as incr'd hoarseness - was outside a lot over the weekend. Pt had minimal voice use today at work. Min A consistently with PhoRTE ("ah" instead of "aw", glides instead of arpeggios, and AB for all three tasks), and mod I with VFE.  07/10/23: Pt brought in a Breath as Prayer book to show SLP. SLP suggested she meditate with the breath prayers as she practices AB, as she has had difficulty with practicing AB. SLP reviewed PhoRTE with pt today; she did not adhere to frequency over the weekend due to unexpected  plans. SLP needed to provide A for using AB for each part of PhoRTE, and for strong voice with numbers.  SLP introduced VFE with pt today and she needed max A but was mod I by the end of session. See "pt instructions". SLP told pt that she would need to cont exercises with 2-3x/week frequency after ST d/c.  07/07/23:  PhoRTE practice today guided by SLP - Robin Banks needed cues from SLP for procedure with exercise #2 (keep strong voice) and #3 (10 high pitched, then 10 low pitched and not alternate high low, and lower pitch on low-pitch sentences). SLP cont'd teaching AB to pt and she demonstrated AB approx 85% at rest. Homework for 12-15 minutes AB BID.     07/03/23: SLP began with introducing AB with pt. At rest she was approx 75% successful with AB. SLP introduced PhoRTE exercise #2 and #3; she req'd occasional min A faded to modified independent. SLP used [employer] provided with AVS today as well as verbal and demo cues from SLP.  06/21/23: Education below provided, benefits of voice therapy, SLP introduced pt and trained her with PhoRTE exercise x1 (/a/).  PATIENT EDUCATION: Education details: See "treatment date" Person educated: Patient Education method: Explanation, Demonstration, and Verbal cues Education comprehension: verbalized understanding, returned demonstration, verbal cues required, and needs further education  HOME EXERCISE PROGRAM: PhoRTE exercises, AB, VFE  GOALS: Goals reviewed with patient? Yes in general  SHORT TERM GOALS: Target date: 07/28/23 (due to first visit week of 07/03/23)  Pt will complete PROM Baseline: Goal status: Met  2.  Pt will complete PhoRTE exercises with rare min A in 2 sessions Baseline:  Goal status: not met  3.  Pt will report (subjectively) greater vocal stamina than prior to ST Baseline:  Goal status: defer to LTGs  4.  Pt will demo AB 80% in 5 minutes simple conversation  Baseline:  Goal status: not met  5.  Pt will demo WNL voice  quality in 3 minutes conversation Baseline:  Goal status: not met   LONG TERM GOALS: Target date: , 09/15/23 (due to scheduling)  Pt will improve V-RQOL score Baseline:  Goal status: INITIAL  2.  Pt  will demo WNL voice quality 80% of the time in 10 minutes simple conversation in 2 sessions Baseline:  Goal status: INITIAL  3.  Pt will demo AB 80% in 10 minutes simple conversation Baseline:  Goal status: INITIAL  4.  Pt will complete PhoRTE exercises with mod independence in 2 sessions Baseline: 08/23/23 Goal status: INITIAL  5.  Pt will indicate she is comfortable reading aloud for Bible study Baseline:  Goal status: INITIAL  6.  Pt will report (subjectively) greater vocal stamina than prior to ST Baseline:  Goal status: moved from STGs   ASSESSMENT:  CLINICAL IMPRESSION: Pt's STGs largely not met due to pt unable to complete HEP as prescribed. She will put a stronger attempt at doing this for the remaining ST sessions. Changed LTG due date due to scheduling. LTG #3 changed. Patient is a 68 y.o. 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 Today she cont'd work with SLP on honing procedure with PhoRTE exercises, and VFE also completed today to further work with vocal fold strengthening/bulking. AB seen in conversation today approx 70% of the time. See "treatment date" above for today's date for further details on today's session. On eval date she indicated that in longer conversations she feels her voice does not have the same stamina it used to and "gets very weak." Lastly, she no longer reads scripture at Bible study. Pt would benefit from SLP guiding her through PhoRTE exercise regimen and also work on improved breath support. SLP may also need to include other voice exercises and techniques into her plan of care (flow phonation, VFE, SOVTE, etc) at some time.   OBJECTIVE IMPAIRMENTS: include voice disorder. These impairments  are limiting patient from household responsibilities, ADLs/IADLs, and effectively communicating at home and in community. Factors affecting potential to achieve goals and functional outcome are none noted.. Patient will benefit from skilled SLP services to address above impairments and improve overall function.  REHAB POTENTIAL: Excellent  PLAN:  SLP FREQUENCY: 1-2x/week  SLP DURATION: 8 weeks  PLANNED INTERVENTIONS:Voice exercises, Internal/external aids, Functional tasks, SLP instruction and feedback, Compensatory strategies, Patient/family education, and [address] Treatment of speech (30 or 45 min)     Deven Audi, CCC-SLP 08/30/2023, 11:55 AM

## 2023-09-06 ENCOUNTER — Encounter

## 2023-09-08 ENCOUNTER — Ambulatory Visit (INDEPENDENT_AMBULATORY_CARE_PROVIDER_SITE_OTHER): Admitting: Otolaryngology

## 2023-09-08 ENCOUNTER — Ambulatory Visit: Attending: Otolaryngology

## 2023-09-08 DIAGNOSIS — R490 Dysphonia: Secondary | ICD-10-CM | POA: Insufficient documentation

## 2023-09-08 NOTE — Therapy (Signed)
 OUTPATIENT SPEECH LANGUAGE PATHOLOGY VOICE TREATMENT   Patient Name: Robin Banks MRN: [id number] DOB:1955/09/25, 68 y.o., female Today's Date: 09/08/2023  PCP: Robin Backbone, DO REFERRING PROVIDER: Artice Last, MD  END OF SESSION:  End of Session - 09/08/23 0806     Visit Number 10    Number of Visits 18    Date for SLP Re-Evaluation 11/03/23    SLP Start Time 0804    SLP Stop Time  0845    SLP Time Calculation (min) 41 min    Activity Tolerance Patient tolerated treatment well                  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 Clarke Banks   CARPAL TUNNEL RELEASE Bilateral 2014   CATARACT EXTRACTION  12/2020   CATARACT EXTRACTION  01/2021   COLONOSCOPY     TUBAL LIGATION Bilateral 1978   Patient Active Problem List   Diagnosis Date Noted   Synovitis and tenosynovitis 08/10/2021   Bilateral carpal tunnel syndrome 02/24/2021   Seasonal and perennial allergic rhinitis 06/27/2019   Hallux rigidus, acquired, left 06/04/2018   Elevated hemoglobin A1c 04/07/2017   Major depressive disorder, recurrent episode, moderate (HCC) 01/30/2017   Postmenopausal symptoms 10/28/2015   Osteopenia 10/28/2015   Overactive bladder 10/28/2015   Insomnia 10/12/2015   Asthma 01/09/2007    Speech Therapy Progress Note  Dates of Reporting Period: 06/21/23 to present  Subjective Statement: PT has been seen for 10 visits of  ST focusing on improving vocal quality  Objective: Pt has partially met goals.   Goal Update: Today she demonstrated s/sx of harsh/rough voice intermittent with WNL voice quality leading SLP to suspect some sort of extra tension in vocal fold vibration or surrounding muscles. SOVTE was successful at producing/fostering improved or WNL voice quality.  Plan: Continue to see pt once/week for 8 more sessions.  Reason Skilled Services are Required: Pt has not yet maxed rehab potential.   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  Pt accompanied by: self  PERTINENT HISTORY:  See above  PAIN:  Are you having pain? No  FALLS: Has patient fallen in Banks 6 months? No  PATIENT GOALS: Get rid of this (hoarse rough voice)  OBJECTIVE:  Note: Objective measures were completed at Evaluation unless otherwise noted.  DIAGNOSTIC FINDINGS:  Assessment and Plan -Robin Banks 05/18/23 Chronic Cough Chronic cough on and off for 8 months, with recurrent episodes over the past two months. Contributing factors include hx of adult-onset asthma and  hx of GERD and post-nasal drip. Exam including flexible laryngoscopy and nasal endoscopy without masses or lesions, normal VF mobility. She had changes c/w GERD LPR and VF atrophy with incomplete glottic closure. She reports allergy testing which was positive for dust mites in the past, several years ago. Differential diagnosis includes asthma, GERD, postnasal drainage, and neurogenic cough. Relief with prednisone  in the past raises suspicion for pulmonary causes, although post-nasal drainage could also respond to  steroids. Discussed potential causes and management options, including Pepcid  and Reflux Gourmet for reflux. - Order chest x-ray  - Start Pepcid  BID 20 mg  - Recommend Reflux Gourmet after meals and at night - Continue current inhalers and nasal sprays/Singulair  and Xyzal   - Referral to pulmonary for further evaluation and pulmonary function tests - Referral to speech therapy for voice therapy and cough suppression techniques   Chronic Gastroesophageal Reflux Disease (GERD) GERD with inconsistent omeprazole use. Reflux changes observed on scope exam, likely contributing to chronic cough and hoarseness. Discussed dietary and lifestyle modifications. 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 Discussed voice therapy benefits - Referral to speech therapy for voice therapy and for chronic cough    Chronic nasal congestion Environmental Allergies Post-Nasal Drip - continue Xyzal  5 mg daily Singulair  10 mg daily  - continue Flonase  2 puffs b/l nares BID  - continue Atrovent  2 puffs b/l nares BID - will consider repeating allergy testing in the future    Follow-up - Schedule follow-up appointment in a few months - Call with chest x-ray results - Schedule appointments with speech therapy and pulmonary as soon as possible.    PATIENT REPORTED OUTCOME MEASURES (PROM): V-RQOL: provided 07/03/23. Returned with scoring herself 20 raw score, and V-RQOL score of 75, correlating to fair to good QOL. She rated her voice of fair quality in the Banks two weeks.                                                                                                                             TREATMENT DATE:  Phonatory  Resistance Training Exercises=PhoRTE, Vocal function exercises=VFE, Semi-occluded vocal tract exercises=SOVTE  09/08/23: Robin Banks cancelled her appointment with Dr. Soldatova. She would like to reschedule for a time SLP thinks would be timed well with her progress in ST.  Pt reports that better voice quality is no more prevalent than it was initially. SLP noted pt with WNL voice quality and harsh/rough quality, leading SLP to believe that pt was producing voice with excess tension intermittently. Lip trill and tongue trills with phrases immediately afterwards had WNL voicing 90% of the time. Pt was told to do lip and tongue trills and focus  on how voice feels, then move that feeling into a phrase/sentence after trills - then attempt to transfer into subsequent utterances.  VFE and PhoRTE were completed since Banks session at least once/day. Today she req'd mod I for completion.   08/23/23: Pt's voice quality remains not unlike initial session with pt. Today she completed VFE with modified independence but limited hold times for /i/ and /o/ (all <5.5 seconds). With PhoRTE, pt req'd initial min A with loud/strong voice for numbers and a glide instead of arpeggio. Pt was modified independent following this. SLP and pt agreed she could decr to once every other week. SLP again stressed the need to complete HEP BID, and pt voiced understanding.  Pt demonstrated AB in 8 minutes conversation 75% of the time  08/16/23:  Pt req'd usual mod A and rare min A with PhoRTE today (strength/volume of responses,  decr in pitch for low-pitch sentences, respectively), and occasional min-mod A with VFE (slower incr and decr in pitch on knoll). SLP and pt agreed pt should still be seen once/week to hone accuracy with voice exercises. Pt stated she thought she could perform HEP first thing in the AM and then on her way to work. SLP encouraged this and provided rationale for HEP.  AB looked present in conversation approx 75% of the  time.   07/24/23: Pt with WNL voice use today at work. HEP cont completed with suboptimal frequency. SLP again stressed need to complete more often for best results. Breyona req'd occasional min-mod cues for PhoRTE for a strong voice (same as with sentences) with numbers. With VFE, pt req'd cues for lo-hi and hi-lo without stair stepping. Pt used hand motion for extra success with this. AB was excellent in exercises today. Pt stated she thought she could handle HEP on her own for the next week so reduced frequency to once/week beginning this week.   07/19/23: Pt with moderate voice use today at work. HEP completed with suboptimal frequency. SLP stressed need to complete more often for best results. SLP told pt if she had to pick one to do BID rather than none, PhoRTE was likely the one to do twice. Lovelle req'd occasional min-mod cues for PhoRTE for ah instead of aw, glides with numbers instead of arpeggios, and a strong voice (same as with sentences) with numbers. With VFE, pt req'd cues for lo-hi instead of lo-hi-lo. AB was excellent in exercises today.  07/17/23: Pulmonary appointment mid-May. Pt c/o dryness and more congestion today, as well as incr'd hoarseness - was outside a lot over the weekend. Pt had minimal voice use today at work. Min A consistently with PhoRTE (ah instead of aw, glides instead of arpeggios, and AB for all three tasks), and mod I with VFE.  07/10/23: Pt brought in a Breath as Prayer book to show SLP. SLP suggested she meditate with the breath prayers as she practices AB, as she has had difficulty with practicing AB. SLP reviewed PhoRTE with pt today; she did not adhere to frequency over the weekend due to unexpected plans. SLP needed to provide A for using AB for each part of PhoRTE, and for strong voice with numbers.  SLP introduced VFE with pt today and she needed max A but was mod I by the end of session. See pt instructions. SLP told pt that she would need to cont  exercises with 2-3x/week frequency after ST d/c.  07/07/23:  PhoRTE practice today guided by SLP - Davielle needed cues from SLP  for procedure with exercise #2 (keep strong voice) and #3 (10 high pitched, then 10 low pitched and not alternate high low, and lower pitch on low-pitch sentences). SLP cont'd teaching AB to pt and she demonstrated AB approx 85% at rest. Homework for 12-15 minutes AB BID.     07/03/23: SLP began with introducing AB with pt. At rest she was approx 75% successful with AB. SLP introduced PhoRTE exercise #2 and #3; she req'd occasional min A faded to modified independent. SLP used [employer] provided with AVS today as well as verbal and demo cues from SLP.  06/21/23: Education below provided, benefits of voice therapy, SLP introduced pt and trained her with PhoRTE exercise x1 (/a/).  PATIENT EDUCATION: Education details: See treatment date Person educated: Patient Education method: Explanation, Demonstration, and Verbal cues Education comprehension: verbalized understanding, returned demonstration, verbal cues required, and needs further education  HOME EXERCISE PROGRAM: PhoRTE exercises, AB, VFE  GOALS: Goals reviewed with patient? Yes in general  SHORT TERM GOALS: Target date: 07/28/23 (due to first visit week of 07/03/23)  Pt will complete PROM Baseline: Goal status: Met  2.  Pt will complete PhoRTE exercises with rare min A in 2 sessions Baseline:  Goal status: not met  3.  Pt will report (subjectively) greater vocal stamina than prior to ST Baseline:  Goal status: defer to LTGs  4.  Pt will demo AB 80% in 5 minutes simple conversation  Baseline:  Goal status: not met  5.  Pt will demo WNL voice quality in 3 minutes conversation Baseline:  Goal status: not met   LONG TERM GOALS: Target date: , 09/15/23 (due to scheduling)  Pt will improve V-RQOL score Baseline:  Goal status: not met and continue  2.  Pt will demo WNL voice quality 80% of the time  in 10 minutes simple conversation in 2 sessions Baseline:  Goal status: not met and continue  3.  Pt will demo AB 80% in 10 minutes simple conversation Baseline:  Goal status: met  4.  Pt will complete PhoRTE exercises with mod independence in 2 sessions Baseline: 08/23/23 Goal status: met  5.  Pt will indicate she is comfortable reading aloud for Bible study Baseline:  Goal status: not met and continue  6.  Pt will report (subjectively) greater vocal stamina than prior to ST Baseline:  Goal status: not met and continue   ASSESSMENT:  CLINICAL IMPRESSION: RECERT today. Patient is a 68 y.o. 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 Today she cont'd work with SLP on honing procedure with PhoRTE exercises, and VFE also completed today to further work with vocal fold strengthening/bulking. AB seen in conversation today approx 70% of the time. SOVTE were completed today due to harsh/rough voice intermittent with WNL voice quality. See treatment date above for today's date for further details on today's session. On eval date she indicated that in longer conversations she feels her voice does not have the same stamina it used to and gets very weak. Lastly, she no longer reads scripture at Bible study. Pt would benefit from SLP guiding her through PhoRTE exercise regimen and also work on improved breath support. SLP may also need to include other voice exercises and techniques into her plan of care (flow phonation, VFE, SOVTE, etc) at some time.   OBJECTIVE IMPAIRMENTS: include voice disorder. These impairments are limiting patient from household responsibilities, ADLs/IADLs, and effectively communicating at home and  in community. Factors affecting potential to achieve goals and functional outcome are none noted.. Patient will benefit from skilled SLP services to address above impairments and improve overall function.  REHAB POTENTIAL:  Excellent  PLAN:  SLP FREQUENCY: 1x/week  SLP DURATION: 8 sessions  PLANNED INTERVENTIONS:Voice exercises, Internal/external aids, Functional tasks, SLP instruction and feedback, Compensatory strategies, Patient/family education, and [id number] Treatment of speech (30 or 45 min)     Stefana Lodico, CCC-SLP 09/08/2023, 9:26 AM

## 2023-09-27 ENCOUNTER — Ambulatory Visit: Admitting: Podiatry

## 2023-09-27 ENCOUNTER — Ambulatory Visit (INDEPENDENT_AMBULATORY_CARE_PROVIDER_SITE_OTHER)

## 2023-09-27 ENCOUNTER — Encounter: Payer: Self-pay | Admitting: Podiatry

## 2023-09-27 DIAGNOSIS — M778 Other enthesopathies, not elsewhere classified: Secondary | ICD-10-CM | POA: Diagnosis not present

## 2023-09-27 DIAGNOSIS — M7752 Other enthesopathy of left foot: Secondary | ICD-10-CM | POA: Diagnosis not present

## 2023-09-27 MED ORDER — TRIAMCINOLONE ACETONIDE 10 MG/ML IJ SUSP
10.0000 mg | Freq: Once | INTRAMUSCULAR | Status: AC
  Administered 2023-09-27: 10 mg via INTRA_ARTICULAR

## 2023-09-28 NOTE — Progress Notes (Signed)
 Subjective:   Patient ID: Robin Banks, female   DOB: 68 y.o.   MRN: [id number]   HPI Patient presents with a lot of pain on the bottom of the left foot and states that it has been going on for several months and she is very active.  Does not remember specific injury and also does have structural bunion deformity left over right.  Patient does not smoke likes to be active   Review of Systems  All other systems reviewed and are negative.       Objective:  Physical Exam Vitals and nursing note reviewed.  Constitutional:      Appearance: She is well-developed.  Pulmonary:     Effort: Pulmonary effort is normal.  Musculoskeletal:        General: Normal range of motion.  Skin:    General: Skin is warm.  Neurological:     Mental Status: She is alert.     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  I am going to focus on this joint and I went ahead today I did a proximal nerve block I then aspirated the second MPJ getting out a small amount of clear fluid injected with quarter cc dexamethasone Kenalog  apply thick padding and instructed her on using these and reappoint again 2 weeks and discussed shoe gear modifications I want her to utilize  X-rays do indicate significant bunion deformity with elevated intermetatarsal angle no indication of arthritis or fracture around second MPJ

## 2023-10-04 ENCOUNTER — Ambulatory Visit

## 2023-10-06 DIAGNOSIS — N3941 Urge incontinence: Secondary | ICD-10-CM | POA: Diagnosis not present

## 2023-10-10 ENCOUNTER — Ambulatory Visit: Attending: Otolaryngology

## 2023-10-10 DIAGNOSIS — R490 Dysphonia: Secondary | ICD-10-CM | POA: Insufficient documentation

## 2023-10-10 NOTE — Therapy (Signed)
 OUTPATIENT SPEECH LANGUAGE PATHOLOGY VOICE TREATMENT   Patient Name: Robin Banks MRN: [id number] DOB:10-Mar-1956, 68 y.o., female Today's Date: 10/10/2023  PCP: Catherine Fuller, DO REFERRING PROVIDER: Okey Burns, MD  END OF SESSION:  End of Session - 10/10/23 0806     Visit Number 11    Number of Visits 18    Date for SLP Re-Evaluation 11/03/23    SLP Start Time 0805    SLP Stop Time  0845    SLP Time Calculation (min) 40 min    Activity Tolerance Patient tolerated treatment well                  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 Gaston   CARPAL TUNNEL RELEASE Bilateral 2014   CATARACT EXTRACTION  12/2020   CATARACT EXTRACTION  01/2021   COLONOSCOPY     TUBAL LIGATION Bilateral 1978   Patient Active Problem List   Diagnosis Date Noted   Synovitis and tenosynovitis 08/10/2021   Bilateral carpal tunnel syndrome 02/24/2021   Seasonal and perennial allergic rhinitis 06/27/2019   Hallux rigidus, acquired, left 06/04/2018   Elevated hemoglobin A1c 04/07/2017   Major depressive disorder, recurrent episode, moderate (HCC) 01/30/2017   Postmenopausal symptoms 10/28/2015   Osteopenia 10/28/2015   Overactive bladder 10/28/2015   Insomnia 10/12/2015   Asthma 01/09/2007     Onset date: A few years  REFERRING DIAG: R05.3 (ICD-10-CM) - Chronic cough R49.0 (ICD-10-CM) - Dysphonia J38.3 (ICD-10-CM) -  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  Pt felt like her voice stronger singing in church last Sunday.    Pt accompanied by: self  PERTINENT HISTORY:  See above  PAIN:  Are you having pain? No  FALLS: Has patient fallen in last 6 months? No  PATIENT GOALS: Get rid of this (hoarse rough voice)  OBJECTIVE:  Note: Objective measures were completed at Evaluation unless otherwise noted.  DIAGNOSTIC FINDINGS:  Assessment and Plan -Soldatova 05/18/23 Chronic Cough Chronic cough on and off for 8 months, with recurrent episodes over the past two months. Contributing factors include hx of adult-onset asthma and hx of GERD and post-nasal drip. Exam including flexible laryngoscopy and nasal endoscopy without masses or lesions, normal VF mobility. She had changes c/w GERD LPR and VF atrophy with incomplete glottic closure. She reports allergy testing which was positive for dust mites in the past, several years ago. Differential diagnosis includes asthma, GERD, postnasal drainage, and neurogenic cough. Relief with prednisone  in the past raises suspicion for pulmonary causes, although post-nasal drainage could also respond to steroids. Discussed potential  causes and management options, including Pepcid  and Reflux Gourmet for reflux. - Order chest x-ray  - Start Pepcid  BID 20 mg  - Recommend Reflux Gourmet after meals and at night - Continue current inhalers and nasal sprays/Singulair  and Xyzal   - Referral to pulmonary for further evaluation and pulmonary function tests - Referral to speech therapy for voice therapy and cough suppression techniques   Chronic Gastroesophageal Reflux Disease (GERD) GERD with inconsistent omeprazole use.  Reflux changes observed on scope exam, likely contributing to chronic cough and hoarseness. Discussed dietary and lifestyle modifications. 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 Discussed voice therapy benefits - Referral to speech therapy for voice therapy and for chronic cough    Chronic nasal congestion Environmental Allergies Post-Nasal Drip - continue Xyzal  5 mg daily Singulair  10 mg daily  - continue Flonase  2 puffs b/l nares BID  - continue Atrovent  2 puffs b/l nares BID - will consider repeating allergy testing in the future    Follow-up - Schedule follow-up appointment in a few months - Call with chest x-ray results - Schedule appointments with speech therapy and pulmonary as soon as possible.    PATIENT REPORTED OUTCOME MEASURES (PROM): V-RQOL: provided 07/03/23. Returned with scoring herself 20 raw score, and V-RQOL score of 75, correlating to fair to good QOL. She rated her voice of fair quality in the last two weeks.                                                                                                                             TREATMENT DATE:  Phonatory Resistance Training Exercises=PhoRTE, Vocal function exercises=VFE, Semi-occluded vocal tract exercises=SOVTE  10/10/23: Pt's sustained /a/ no greater than 7.3 seconds, average 6.1 seconds. SLP wonders if vocal fold atrophy persists, limiting pt's ability to hold vowel > 7.3 seconds. 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 After tongue trills, pt's voice more normal - despite multiple attempts by SLP to draw dichotomy between WNL-sounding voice and  pt's rumbly rough voice pt could not hear nor feel difference. When performing PhoRTE and VFE pt demonstrated rough voice intermittently. SLP suggested pt see Dr. Soldatova at this point due to SLP questioning if glottal gap and atrophy persists, and if pt using any sort of laryngeal tension during vocalization.  09/08/23: Donasia cancelled her appointment with Dr. Soldatova. She would like to reschedule for a time SLP thinks would be timed well with her progress in ST.  Pt reports that better voice quality is no more prevalent than it was initially. SLP noted pt with WNL voice quality and harsh/rough quality, leading SLP to  believe that pt was producing voice with excess tension intermittently. Lip trill and tongue trills with phrases immediately afterwards had WNL voicing 90% of the time. Pt was told to do lip and tongue trills and focus on how voice feels, then move that feeling into a phrase/sentence after trills - then attempt to transfer into subsequent utterances.  VFE and PhoRTE were completed since last session at least once/day. Today she req'd mod I for completion.   08/23/23: Pt's voice quality remains not unlike initial session with pt. Today she completed VFE with modified independence but limited hold times for /i/ and /o/ (all <5.5 seconds). With PhoRTE, pt req'd initial min A with loud/strong voice for numbers and a glide instead of arpeggio. Pt was modified independent following this. SLP and pt agreed she could decr to once every other week. SLP again stressed the need to complete HEP BID, and pt voiced understanding.  Pt demonstrated AB in 8 minutes conversation 75% of the time  08/16/23:  Pt req'd usual mod A and rare min A with PhoRTE today (strength/volume of responses,  decr in pitch for low-pitch sentences, respectively), and occasional min-mod A with VFE (slower incr and decr in pitch on knoll). SLP and pt agreed pt should still be seen once/week to hone accuracy with voice  exercises. Pt stated she thought she could perform HEP first thing in the AM and then on her way to work. SLP encouraged this and provided rationale for HEP.  AB looked present in conversation approx 75% of the time.   07/24/23: Pt with WNL voice use today at work. HEP cont completed with suboptimal frequency. SLP again stressed need to complete more often for best results. [place]ity req'd occasional min-mod cues for PhoRTE for a strong voice (same as with sentences) with numbers. With VFE, pt req'd cues for lo-hi and hi-lo without stair stepping. Pt used hand motion for extra success with this. AB was excellent in exercises today. Pt stated she thought she could handle HEP on her own for the next week so reduced frequency to once/week beginning this week.   07/19/23: Pt with moderate voice use today at work. HEP completed with suboptimal frequency. SLP stressed need to complete more often for best results. SLP told pt if she had to pick one to do BID rather than none, PhoRTE was likely the one to do twice. [place] req'd occasional min-mod cues for PhoRTE for ah instead of aw, glides with numbers instead of arpeggios, and a strong voice (same as with sentences) with numbers. With VFE, pt req'd cues for lo-hi instead of lo-hi-lo. AB was excellent in exercises today.  07/17/23: Pulmonary appointment mid-May. Pt c/o dryness and more congestion today, as well as incr'd hoarseness - was outside a lot over the weekend. Pt had minimal voice use today at work. Min A consistently with PhoRTE (ah instead of aw, glides instead of arpeggios, and AB for all three tasks), and mod I with VFE.  07/10/23: Pt brought in a Breath as Prayer book to show SLP. SLP suggested she meditate with the breath prayers as she practices AB, as she has had difficulty with practicing AB. SLP reviewed PhoRTE with pt today; she did not adhere to frequency over the weekend due to unexpected plans. SLP needed to provide A for using AB for each  part of PhoRTE, and for strong voice with numbers.  SLP introduced VFE with pt today and she needed max A but was mod I by the  end of session. See pt instructions. SLP told pt that she would need to cont exercises with 2-3x/week frequency after ST d/c.  07/07/23:  PhoRTE practice today guided by SLP - Kathi needed cues from SLP for procedure with exercise #2 (keep strong voice) and #3 (10 high pitched, then 10 low pitched and not alternate high low, and lower pitch on low-pitch sentences). SLP cont'd teaching AB to pt and she demonstrated AB approx 85% at rest. Homework for 12-15 minutes AB BID.     07/03/23: SLP began with introducing AB with pt. At rest she was approx 75% successful with AB. SLP introduced PhoRTE exercise #2 and #3; she req'd occasional min A faded to modified independent. SLP used [employer] provided with AVS today as well as verbal and demo cues from SLP.  06/21/23: Education below provided, benefits of voice therapy, SLP introduced pt and trained her with PhoRTE exercise x1 (/a/).  PATIENT EDUCATION: Education details: See treatment date Person educated: Patient Education method: Explanation, Demonstration, and Verbal cues Education comprehension: verbalized understanding, returned demonstration, verbal cues required, and needs further education  HOME EXERCISE PROGRAM: PhoRTE exercises, AB, VFE  GOALS: Goals reviewed with patient? Yes in general  SHORT TERM GOALS: Target date: 07/28/23 (due to first visit week of 07/03/23)  Pt will complete PROM Baseline: Goal status: Met  2.  Pt will complete PhoRTE exercises with rare min A in 2 sessions Baseline:  Goal status: not met  3.  Pt will report (subjectively) greater vocal stamina than prior to ST Baseline:  Goal status: defer to LTGs  4.  Pt will demo AB 80% in 5 minutes simple conversation  Baseline:  Goal status: not met  5.  Pt will demo WNL voice quality in 3 minutes conversation Baseline:  Goal  status: not met   LONG TERM GOALS: Target date: , 09/15/23 (due to scheduling)  Pt will improve V-RQOL score Baseline:  Goal status: continue  2.  Pt will demo WNL voice quality 80% of the time in 10 minutes simple conversation in 2 sessions Baseline:  Goal status: continue  3.  Pt will demo AB 80% in 10 minutes simple conversation Baseline:  Goal status: met  4.  Pt will complete PhoRTE exercises with mod independence in 2 sessions Baseline: 08/23/23 Goal status: met  5.  Pt will indicate she is comfortable reading aloud for Bible study Baseline:  Goal status: continue  6.  Pt will report (subjectively) greater vocal stamina than prior to ST Baseline:  Goal status: continue  7.  Pt will ID different voice characteristics (after SOVTE) vs rough voice correctly in three sessions Baseline:  Goal status: INITIAL   ASSESSMENT:  CLINICAL IMPRESSION: Patient is a 68 y.o. 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 Today she cont'd work with SLP on honing procedure with PhoRTE exercises, and VFE also completed today to further work with vocal fold strengthening/bulking. SOVTE were again completed today due to intermittent rough voice with WNL voice quality. See treatment date above for today's date for further details on today's session. SLP suggested follow up with Dr. Soldatova to pt today. On eval date she indicated that in longer conversations she feels her voice does not have the same stamina it used to and gets very weak. Lastly, she no longer reads scripture at Bible study. Pt would benefit from SLP guiding her through PhoRTE exercise regimen, and also work on improved breath  support. SLP included SOVTE into her LTGs today.   OBJECTIVE IMPAIRMENTS: include voice disorder. These impairments are limiting patient from household responsibilities, ADLs/IADLs, and effectively communicating at home and in community. Factors  affecting potential to achieve goals and functional outcome are none noted.. Patient will benefit from skilled SLP services to address above impairments and improve overall function.  REHAB POTENTIAL: Excellent  PLAN:  SLP FREQUENCY: 1x/week  SLP DURATION: 8 sessions  PLANNED INTERVENTIONS:Voice exercises, Internal/external aids, Functional tasks, SLP instruction and feedback, Compensatory strategies, Patient/family education, and [id number] Treatment of speech (30 or 45 min)     Francee Setzer, CCC-SLP 10/10/2023, 3:55 PM

## 2023-10-11 ENCOUNTER — Ambulatory Visit: Admitting: Podiatry

## 2023-10-11 ENCOUNTER — Encounter: Payer: Self-pay | Admitting: Podiatry

## 2023-10-11 VITALS — Ht 61.5 in | Wt 133.0 lb

## 2023-10-11 DIAGNOSIS — M7752 Other enthesopathy of left foot: Secondary | ICD-10-CM

## 2023-10-11 MED ORDER — TRIAMCINOLONE ACETONIDE 10 MG/ML IJ SUSP
10.0000 mg | Freq: Once | INTRAMUSCULAR | Status: AC
  Administered 2023-10-11: 10 mg via INTRA_ARTICULAR

## 2023-10-11 MED ORDER — DICLOFENAC SODIUM 75 MG PO TBEC: 75.0000 mg | DELAYED_RELEASE_TABLET | Freq: Two times a day (BID) | ORAL | 2 refills | Status: AC

## 2023-10-13 DIAGNOSIS — R350 Frequency of micturition: Secondary | ICD-10-CM | POA: Diagnosis not present

## 2023-10-13 DIAGNOSIS — N3946 Mixed incontinence: Secondary | ICD-10-CM | POA: Diagnosis not present

## 2023-10-13 NOTE — Patient Instructions (Signed)
If

## 2023-10-13 NOTE — Progress Notes (Signed)
 Subjective:   Patient ID: Robin Banks, female   DOB: 68 y.o.   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    I did do proximal nerve block I aspirated the third MPJ getting out a small amount of clear fluid injected with quarter cc Dexasone Kenalog  and advised on the importance of immobilization with rigid bottom shoes.   Reappoint to recheck in the next 4 weeks

## 2023-10-16 ENCOUNTER — Ambulatory Visit

## 2023-10-20 ENCOUNTER — Ambulatory Visit (INDEPENDENT_AMBULATORY_CARE_PROVIDER_SITE_OTHER): Admitting: Family Medicine

## 2023-10-20 ENCOUNTER — Ambulatory Visit: Payer: Medicare HMO | Admitting: Family Medicine

## 2023-10-20 ENCOUNTER — Encounter: Payer: Self-pay | Admitting: Family Medicine

## 2023-10-20 VITALS — BP 108/70 | HR 84 | Temp 98.0°F | Wt 135.6 lb

## 2023-10-20 DIAGNOSIS — F5101 Primary insomnia: Secondary | ICD-10-CM

## 2023-10-20 DIAGNOSIS — R7309 Other abnormal glucose: Secondary | ICD-10-CM | POA: Diagnosis not present

## 2023-10-20 DIAGNOSIS — M8589 Other specified disorders of bone density and structure, multiple sites: Secondary | ICD-10-CM

## 2023-10-20 DIAGNOSIS — J4540 Moderate persistent asthma, uncomplicated: Secondary | ICD-10-CM | POA: Diagnosis not present

## 2023-10-20 LAB — POCT GLYCOSYLATED HEMOGLOBIN (HGB A1C)
HbA1c POC (<> result, manual entry): 5.5 % (ref 4.0–5.6)
HbA1c, POC (controlled diabetic range): 5.5 % (ref 0.0–7.0)
HbA1c, POC (prediabetic range): 5.5 % — AB (ref 5.7–6.4)
Hemoglobin A1C: 5.5 % (ref 4.0–5.6)

## 2023-10-20 MED ORDER — ALBUTEROL SULFATE HFA 108 (90 BASE) MCG/ACT IN AERS: 2.0000 | INHALATION_SPRAY | RESPIRATORY_TRACT | 1 refills | Status: AC | PRN

## 2023-10-20 MED ORDER — BUDESONIDE-FORMOTEROL FUMARATE 80-4.5 MCG/ACT IN AERO: 2.0000 | INHALATION_SPRAY | Freq: Two times a day (BID) | RESPIRATORY_TRACT | 5 refills | Status: AC

## 2023-10-20 MED ORDER — MONTELUKAST SODIUM 10 MG PO TABS: 10.0000 mg | ORAL_TABLET | Freq: Every day | ORAL | 2 refills | Status: AC

## 2023-10-20 MED ORDER — TRAZODONE HCL 150 MG PO TABS: 150.0000 mg | ORAL_TABLET | Freq: Every day | ORAL | 2 refills | Status: AC

## 2023-10-20 NOTE — Progress Notes (Signed)
 Patient ID: Evelynne M Dedmon, female  DOB: 08-12-1955, 68 y.o.   MRN: [id number] Patient Care Team    Relationship Specialty Notifications Start End  Catherine Charlies LABOR, DO PCP - General Family Medicine  10/26/15   Leva Rush, MD Consulting Physician Obstetrics and Gynecology  10/28/15   Darlean Ozell NOVAK, MD Consulting Physician Pulmonary Disease  10/28/15   Legrand Victory LITTIE DOUGLAS, MD Consulting Physician Gastroenterology  02/23/21     Chief Complaint  Patient presents with   Asthma   Insomnia    Chronic Conditions/illness Management     Subjective: Caressa SABEL HORNBECK is a 68 y.o.  Female  present for Chronic Conditions/illness Management combination appt All past medical history, surgical history, allergies, family history, immunizations, medications and social history were updated in the electronic medical record today. All recent labs, ED visits and hospitalizations within the last year were reviewed.   Insomnia: Patient reported she is has been feeling good,  she is doing great.  Compliant with trazodone  150 nightly  Asthma/allergies/persistent cough: Patient reports her cough again returned despite multiple different antihistamines, Singulair , nasal sprays, reflux medicines.  She is agreeable to ENT referral for evaluation.     10/20/2023    8:49 AM 07/12/2023    3:17 PM 04/28/2023    9:21 AM 11/08/2022    8:31 AM 09/15/2022    7:45 AM  Depression screen PHQ 2/9  Decreased Interest 3 0 0 1 1  Down, Depressed, Hopeless 3 0 1 1 0  PHQ - 2 Score 6 0 1 2 1   Altered sleeping 0 0 0 0 0  Tired, decreased energy 3 3 1 2 1   Change in appetite 0 0 0 0 0  Feeling bad or failure about yourself  0 0 0 0 0  Trouble concentrating 0 0 0 0 0  Moving slowly or fidgety/restless 0 0 0 0 0  Suicidal thoughts 0 0 0 0 0  PHQ-9 Score 9 3 2 4 2   Difficult doing work/chores Not difficult at all Not difficult at all Not difficult at all Not difficult at all       10/20/2023    8:50 AM 04/28/2023    9:21  AM 11/08/2022    8:31 AM 04/18/2022    8:10 AM  GAD 7 : Generalized Anxiety Score  Nervous, Anxious, on Edge 3 3 3  0  Control/stop worrying 3 0  0  Worry too much - different things 3 0 2 0  Trouble relaxing 3 2 3 1   Restless 2 0 0 0  Easily annoyed or irritable 1 1 1  0  Afraid - awful might happen 0 0 0 0  Total GAD 7 Score 15 6  1   Anxiety Difficulty Somewhat difficult Not difficult at all Not difficult at all    Immunization History  Administered Date(s) Administered   Fluad Quad(high Dose 65+) 12/05/2018, 01/10/2020, 12/26/2021   Fluad Trivalent(High Dose 65+) 11/08/2022   Influenza,inj,Quad PF,6+ Mos 03/24/2016, 01/24/2017, 12/04/2017   Influenza-Unspecified 12/26/2013, 01/24/2017, 12/26/2020   PFIZER(Purple Top)SARS-COV-2 Vaccination 06/02/2019, 07/02/2019, 01/28/2020   Pneumococcal Conjugate-13 02/03/2020   Pneumococcal Polysaccharide-23 11/20/2012, 02/23/2021   Td 04/29/2020   Tdap 02/03/2009, 04/30/2020   Zoster Recombinant(Shingrix) 03/09/2020, 06/15/2020   Zoster, Live 03/15/2013   Past Medical History:  Diagnosis Date   Allergy    Angio-edema    Asthma    Cataract    Chicken pox    Compression fracture of thoracic spine, non-traumatic (HCC)  T7-8   Depression    Excess ear wax    GERD (gastroesophageal reflux disease)    Hyperlipidemia    no meds taken   Insomnia    Major depressive disorder, recurrent episode, moderate (HCC) 01/30/2017   Menopause    Moderate recurrent major depression (HCC) 01/30/2017   Osteopenia    Overactive bladder    TMJ (temporomandibular joint syndrome)    Urinary incontinence    Voice hoarseness 01/25/2022   Allergies  Allergen Reactions   Demerol [Meperidine Hcl] Nausea And Vomiting   Nitrofurantoin Nausea Only    swelling   Past Surgical History:  Procedure Laterality Date   BLADDER SUSPENSION  2011   Dr. Gaston   CARPAL TUNNEL RELEASE Bilateral 2014   CATARACT EXTRACTION  12/2020   CATARACT EXTRACTION   01/2021   COLONOSCOPY     TUBAL LIGATION Bilateral 1978   Family History  Problem Relation Age of Onset   Hypertension Father    CAD Father    Hearing loss Father    Heart disease Father    COPD Father    Hyperlipidemia Mother    Arthritis Mother    Alcohol abuse Paternal Grandfather    Heart attack Paternal Grandfather    CAD Paternal Grandfather    Dementia Paternal Grandmother    Aortic aneurysm Maternal Grandmother    Cerebral palsy Son        Mild CP by records   Kidney nephrosis Son        FSG, kidney transplant.    Colon cancer Neg Hx    Esophageal cancer Neg Hx    Stomach cancer Neg Hx    Rectal cancer Neg Hx    Allergic rhinitis Neg Hx    Asthma Neg Hx    Eczema Neg Hx    Urticaria Neg Hx    Social History   Social History Narrative   Married to [place]). Three children Corean Hamilton and Juliene.    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    Takes herbal remedies, daily vitamin,    Wears seatbelt   Exercises 3x a week   Smoke detector in the home.    Firearms in the home (locked)   Feels safe in relationship.     Allergies as of 10/20/2023       Reactions   Demerol [meperidine Hcl] Nausea And Vomiting   Nitrofurantoin Nausea Only   swelling        Medication List        Accurate as of October 20, 2023  9:27 AM. If you have any questions, ask your nurse or doctor.          albuterol  108 (90 Base) MCG/ACT inhaler Commonly known as: Ventolin  HFA Inhale 2 puffs into the lungs every 4 (four) hours as needed for wheezing or shortness of breath (coughing fits).   atorvastatin  20 MG tablet Commonly known as: LIPITOR TAKE 1 TABLET BY MOUTH DAILY   BIOTIN PO Take by mouth.   budesonide -formoterol  80-4.5 MCG/ACT inhaler Commonly known as: Symbicort  Inhale 2 puffs into the lungs in the morning and at bedtime. with spacer and rinse mouth afterwards.   CALCIUM  PLUS VITAMIN D3 PO Take by mouth daily.   diclofenac  75 MG EC tablet Commonly known as:  VOLTAREN  Take 1 tablet (75 mg total) by mouth 2 (two) times daily.   famotidine  20 MG tablet Commonly known as: PEPCID  TAKE 1 TABLET BY MOUTH 2 TIMES A DAY   fluticasone  50  MCG/ACT nasal spray Commonly known as: FLONASE  Place 2 sprays into both nostrils daily.   GEMTESA PO Take by mouth.   ipratropium 0.06 % nasal spray Commonly known as: ATROVENT  Place 2 sprays into both nostrils 4 (four) times daily.   levocetirizine 5 MG tablet Commonly known as: XYZAL  Take 1 tablet (5 mg total) by mouth every evening.   MAGNESIUM PO Take by mouth.   montelukast  10 MG tablet Commonly known as: Singulair  Take 1 tablet (10 mg total) by mouth at bedtime.   MULTIVITAMIN PO Take by mouth daily.   traZODone  150 MG tablet Commonly known as: DESYREL  Take 1 tablet (150 mg total) by mouth at bedtime.        All past medical history, surgical history, allergies, family history, immunizations andmedications were updated in the EMR today and reviewed under the history and medication portions of their EMR.      ROS: 14 pt review of systems performed and negative (unless mentioned in an HPI)  Objective: BP 108/70   Pulse 84   Temp 98 F (36.7 C)   Wt 135 lb 9.6 oz (61.5 kg)   SpO2 95%   BMI 25.21 kg/m  Physical Exam Vitals and nursing note reviewed.  Constitutional:      General: She is not in acute distress.    Appearance: Normal appearance. She is not ill-appearing, toxic-appearing or diaphoretic.  HENT:     Head: Normocephalic and atraumatic.  Eyes:     General: No scleral icterus.       Right eye: No discharge.        Left eye: No discharge.     Extraocular Movements: Extraocular movements intact.     Conjunctiva/sclera: Conjunctivae normal.     Pupils: Pupils are equal, round, and reactive to light.  Cardiovascular:     Rate and Rhythm: Normal rate and regular rhythm.  Pulmonary:     Effort: Pulmonary effort is normal. No respiratory distress.     Breath sounds: Normal  breath sounds. No wheezing, rhonchi or rales.  Musculoskeletal:     Right lower leg: No edema.     Left lower leg: No edema.  Skin:    General: Skin is warm.     Findings: No rash.  Neurological:     Mental Status: She is alert and oriented to person, place, and time. Mental status is at baseline.     Motor: No weakness.     Gait: Gait normal.  Psychiatric:        Mood and Affect: Mood normal.        Behavior: Behavior normal.        Thought Content: Thought content normal.        Judgment: Judgment normal.    Results for orders placed or performed in visit on 10/20/23 (from the past 48 hours)  POCT HgB A1C     Status: Abnormal   Collection Time: 10/20/23  9:03 AM  Result Value Ref Range   Hemoglobin A1C 5.5 4.0 - 5.6 %   HbA1c POC (<> result, manual entry) 5.5 4.0 - 5.6 %   HbA1c, POC (prediabetic range) 5.5 (A) 5.7 - 6.4 %   HbA1c, POC (controlled diabetic range) 5.5 0.0 - 7.0 %    Assessment/plan: ULYANA PITONES is a 68 y.o. female present for chronic conditions management routine follow-up combination appointment Insomnia, unspecified type Stable Continue trazodone  150 mg nightly. -Patient would like to avoid any of the controlled substances including hypnotics. -  Could consider gabapentin  -Tried: Trazodone , amitriptyline , Remeron  - Follow-up every 5.5 months on current issues, sooner if still not sleeping well.  Could consider a sleep specialist to rule out other sleep disorder.  Moderate persistent asthma without complication/allergic rhinitis/seasonal allergies Stable Continue inhalers and Singulair   Continue OTC oral antihistamine PRN  Continue Flonase  and Atrovent  nasal spray Continue Symbicort  2 puffs twice daily Continue albuterol  as needed  Elevated a1c 6.3 > 5.4> 5.5>5.8>6.0> 6.2 > 5.5 A1c collected today   Disorder of bone/osteopenia/vitamin D  deficiency - Vitamin D  UTD 03/2023 She supplements with 1500 mg calcium  with vit d (unknown total dose) Dexa  completed at gyn UTD 2024   Return in about 6 months (around 04/29/2024) for cpe (20 min), Routine chronic condition follow-up.   Orders Placed This Encounter  Procedures   POCT HgB A1C   Meds ordered this encounter  Medications   albuterol  (VENTOLIN  HFA) 108 (90 Base) MCG/ACT inhaler    Sig: Inhale 2 puffs into the lungs every 4 (four) hours as needed for wheezing or shortness of breath (coughing fits).    Dispense:  18 g    Refill:  1   montelukast  (SINGULAIR ) 10 MG tablet    Sig: Take 1 tablet (10 mg total) by mouth at bedtime.    Dispense:  90 tablet    Refill:  2   traZODone  (DESYREL ) 150 MG tablet    Sig: Take 1 tablet (150 mg total) by mouth at bedtime.    Dispense:  90 tablet    Refill:  2   budesonide -formoterol  (SYMBICORT ) 80-4.5 MCG/ACT inhaler    Sig: Inhale 2 puffs into the lungs in the morning and at bedtime. with spacer and rinse mouth afterwards.    Dispense:  1 each    Refill:  5    Referral Orders  No referral(s) requested today      Electronically signed by: Charlies Bellini, DO [facility]

## 2023-10-20 NOTE — Patient Instructions (Addendum)

## 2023-10-24 ENCOUNTER — Ambulatory Visit

## 2023-10-24 DIAGNOSIS — R490 Dysphonia: Secondary | ICD-10-CM | POA: Diagnosis not present

## 2023-10-24 NOTE — Therapy (Signed)
 OUTPATIENT SPEECH LANGUAGE PATHOLOGY VOICE TREATMENT   Patient Name: Robin Banks MRN: [id number] DOB:November 23, 1955, 68 y.o., female Today's Date: 10/24/2023  PCP: Catherine Fuller, DO REFERRING PROVIDER: Okey Burns, MD  END OF SESSION:  End of Session - 10/24/23 1707     Visit Number 12    Number of Visits 18    Date for SLP Re-Evaluation 11/03/23    SLP Start Time 1536    SLP Stop Time  1611    SLP Time Calculation (min) 35 min    Activity Tolerance Patient tolerated treatment well                   Past Medical History:  Diagnosis Date   Allergy    Angio-edema    Asthma    Cataract    Chicken pox    Compression fracture of thoracic spine, non-traumatic (HCC)    T7-8   Depression    Excess ear wax    GERD (gastroesophageal reflux disease)    Hyperlipidemia    no meds taken   Insomnia    Major depressive disorder, recurrent episode, moderate (HCC) 01/30/2017   Menopause    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 Gaston   CARPAL TUNNEL RELEASE Bilateral 2014   CATARACT EXTRACTION  12/2020   CATARACT EXTRACTION  01/2021   COLONOSCOPY     TUBAL LIGATION Bilateral 1978   Patient Active Problem List   Diagnosis Date Noted   Synovitis and tenosynovitis 08/10/2021   Bilateral carpal tunnel syndrome 02/24/2021   Seasonal and perennial allergic rhinitis 06/27/2019   Hallux rigidus, acquired, left 06/04/2018   Elevated hemoglobin A1c 04/07/2017   Postmenopausal symptoms 10/28/2015   Osteopenia 10/28/2015   Overactive bladder 10/28/2015   Insomnia 10/12/2015   Asthma 01/09/2007   SPEECH THERAPY DISCHARGE SUMMARY  Visits from Start of Care: 12  Current functional level related to goals / functional  outcomes: See below. Pt demonstrated s/sx rough voice until d/c. Pt's adherence to frequency for HEP was not optimal. Pt with little change in functional measures for decreasing glottal gap (/a/ phonation time), however with intermittent success pt generated voicing that would be considered WNL.    Remaining deficits: Hoarseness.   Education / Equipment: See individual therapy session notes for details.    Patient agrees to discharge. Patient goals were partially met. Patient is being discharged due to waiting until follow up with referring MD to ascertain if more ST beneficial..    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  Pt accompanied by: self  PERTINENT HISTORY:  See above  PAIN:  Are you having pain? No  FALLS: Has patient fallen in last 6 months? No  PATIENT GOALS: Get rid of this (hoarse rough voice)  OBJECTIVE:  Note: Objective measures were completed at Evaluation unless otherwise noted.  DIAGNOSTIC FINDINGS:  Assessment and Plan -Soldatova 05/18/23 Chronic Cough Chronic cough on and off for 8 months,  with recurrent episodes over the past two months. Contributing factors include hx of adult-onset asthma and hx of GERD and post-nasal drip. Exam including flexible laryngoscopy and nasal endoscopy without masses or lesions, normal VF mobility. She had changes c/w GERD LPR and VF atrophy with incomplete glottic closure. She reports allergy testing which was positive for dust mites in the past, several years ago. Differential diagnosis includes asthma, GERD, postnasal drainage, and neurogenic cough. Relief with prednisone  in the past raises  suspicion for pulmonary causes, although post-nasal drainage could also respond to steroids. Discussed potential causes and management options, including Pepcid  and Reflux Gourmet for reflux. - Order chest x-ray  - Start Pepcid  BID 20 mg  - Recommend Reflux Gourmet after meals and at night - Continue current inhalers and nasal sprays/Singulair  and Xyzal   - Referral to pulmonary for further evaluation and pulmonary function tests - Referral to speech therapy for voice therapy and cough suppression techniques   Chronic Gastroesophageal Reflux Disease (GERD) GERD with inconsistent omeprazole use. Reflux changes observed on scope exam, likely contributing to chronic cough and hoarseness. Discussed dietary and lifestyle modifications. 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 Discussed voice therapy benefits - Referral to speech therapy for voice therapy and for chronic cough    Chronic nasal congestion Environmental Allergies Post-Nasal Drip - continue Xyzal  5 mg daily Singulair  10 mg daily  - continue Flonase  2 puffs b/l nares BID  - continue Atrovent  2 puffs b/l nares BID - will consider repeating allergy testing in the future    Follow-up - Schedule follow-up appointment in a few months - Call with chest x-ray results - Schedule appointments with speech therapy and pulmonary as soon as possible.    PATIENT REPORTED OUTCOME MEASURES (PROM): V-RQOL: provided 07/03/23. Returned with scoring herself 20 raw score, and V-RQOL score of 75, correlating to fair to good QOL. She rated her voice of fair quality in the last two weeks.                                                                                                                              TREATMENT DATE:  Phonatory Resistance Training Exercises=PhoRTE, Vocal function exercises=VFE, Semi-occluded vocal tract exercises=SOVTE  10/24/23: Today pt with intermittent rough voice indicating possibility of excessive tension during phonation.  Pt req'd min-mod A for sustaining /a/ with full breath - average 4.9 seconds. Numbers completed today with decr'd quality with lower pitches but procedure WNL. With sentences pt demonstrated more WNL vocal quality with louder (louder than WNL) production. VFE completed with occasional min-mod cue for full breaths, pt's productions were approx 5 seconds, which makes SLP question whether or  not pt still has evidence of vocal fold atrophy. SLP told pt to cont to perform VFE and PhoRTE at [place] x3/week until a follow up with Dr. Soldatova.  SLP discussed with pt to d/c this plan of care at this time and see her again if necessary for another plan of care after a follow up with Dr. Soldatova to assess vocal fold status. Pt agreed this was appropriate at this time.   10/10/23: Pt's sustained /a/ no greater than 7.3 seconds, average 6.1 seconds. SLP wonders if vocal fold atrophy persists, limiting pt's ability to hold vowel > 7.3 seconds. 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 After tongue trills, pt's voice more normal - despite multiple attempts by SLP to draw dichotomy between WNL-sounding voice and pt's rumbly rough voice pt could not hear nor feel difference. When performing PhoRTE and VFE pt demonstrated rough voice intermittently. SLP suggested pt see Dr. Soldatova at this point due to SLP questioning if glottal gap and atrophy persists, and if pt using any sort of laryngeal tension during vocalization.  09/08/23: Yamileth cancelled her appointment with Dr. Soldatova. She would like to reschedule for a time SLP thinks would be timed  well with her progress in ST.  Pt reports that better voice quality is no more prevalent than it was initially. SLP noted pt with WNL voice quality and harsh/rough quality, leading SLP to believe that pt was producing voice with excess tension intermittently. Lip trill and tongue trills with phrases immediately afterwards had WNL voicing 90% of the time. Pt was told to do lip and tongue trills and focus on how voice feels, then move that feeling into a phrase/sentence after trills - then attempt to transfer into subsequent utterances.  VFE and PhoRTE were completed since last session at least once/day. Today she req'd mod I for completion.   08/23/23: Pt's voice quality remains not unlike initial session with pt. Today she completed VFE with modified independence but limited hold times for /i/ and /o/ (all <5.5 seconds). With PhoRTE, pt req'd initial min A with loud/strong voice for numbers and a glide instead of arpeggio. Pt was modified independent following this. SLP and pt agreed she could decr to once every other week. SLP again stressed the need to complete HEP BID, and pt voiced understanding.  Pt demonstrated AB in 8 minutes conversation 75% of the time  08/16/23:  Pt req'd usual mod A and rare min A with PhoRTE today (strength/volume of responses,  decr in pitch for low-pitch sentences, respectively), and occasional min-mod A with VFE (slower incr and decr in pitch on knoll). SLP and pt agreed pt should still be seen once/week to hone accuracy with voice exercises. Pt stated she thought she could perform HEP first thing in the AM and then on her way to work. SLP encouraged this and provided rationale for HEP.  AB looked present in conversation approx 75% of the time.   07/24/23: Pt with WNL voice use today at work. HEP cont completed with suboptimal frequency. SLP again stressed need to complete more often for best results. Meliyah req'd occasional min-mod cues for PhoRTE for a strong voice (same as  with sentences) with numbers. With VFE, pt req'd cues for lo-hi and hi-lo without stair stepping. Pt used hand motion for extra success with this. AB was excellent in exercises today. Pt stated she thought she could handle HEP on her own for the next  week so reduced frequency to once/week beginning this week.   07/19/23: Pt with moderate voice use today at work. HEP completed with suboptimal frequency. SLP stressed need to complete more often for best results. SLP told pt if she had to pick one to do BID rather than none, PhoRTE was likely the one to do twice. Brentney req'd occasional min-mod cues for PhoRTE for ah instead of aw, glides with numbers instead of arpeggios, and a strong voice (same as with sentences) with numbers. With VFE, pt req'd cues for lo-hi instead of lo-hi-lo. AB was excellent in exercises today.  07/17/23: Pulmonary appointment mid-May. Pt c/o dryness and more congestion today, as well as incr'd hoarseness - was outside a lot over the weekend. Pt had minimal voice use today at work. Min A consistently with PhoRTE (ah instead of aw, glides instead of arpeggios, and AB for all three tasks), and mod I with VFE.  07/10/23: Pt brought in a Breath as Prayer book to show SLP. SLP suggested she meditate with the breath prayers as she practices AB, as she has had difficulty with practicing AB. SLP reviewed PhoRTE with pt today; she did not adhere to frequency over the weekend due to unexpected plans. SLP needed to provide A for using AB for each part of PhoRTE, and for strong voice with numbers.  SLP introduced VFE with pt today and she needed max A but was mod I by the end of session. See pt instructions. SLP told pt that she would need to cont exercises with 2-3x/week frequency after ST d/c.  07/07/23:  PhoRTE practice today guided by SLP - Elzada needed cues from SLP for procedure with exercise #2 (keep strong voice) and #3 (10 high pitched, then 10 low pitched and not alternate high  low, and lower pitch on low-pitch sentences). SLP cont'd teaching AB to pt and she demonstrated AB approx 85% at rest. Homework for 12-15 minutes AB BID.     07/03/23: SLP began with introducing AB with pt. At rest she was approx 75% successful with AB. SLP introduced PhoRTE exercise #2 and #3; she req'd occasional min A faded to modified independent. SLP used [employer] provided with AVS today as well as verbal and demo cues from SLP.  06/21/23: Education below provided, benefits of voice therapy, SLP introduced pt and trained her with PhoRTE exercise x1 (/a/).  PATIENT EDUCATION: Education details: See treatment date Person educated: Patient Education method: Explanation, Demonstration, and Verbal cues Education comprehension: verbalized understanding, returned demonstration, verbal cues required, and needs further education  HOME EXERCISE PROGRAM: PhoRTE exercises, AB, VFE  GOALS: Goals reviewed with patient? Yes in general  SHORT TERM GOALS: Target date: 07/28/23 (due to first visit week of 07/03/23)  Pt will complete PROM Baseline: Goal status: Met  2.  Pt will complete PhoRTE exercises with rare min A in 2 sessions Baseline:  Goal status: not met  3.  Pt will report (subjectively) greater vocal stamina than prior to ST Baseline:  Goal status: defer to LTGs  4.  Pt will demo AB 80% in 5 minutes simple conversation  Baseline:  Goal status: not met  5.  Pt will demo WNL voice quality in 3 minutes conversation Baseline:  Goal status: not met   LONG TERM GOALS: Target date: , 11/03/23 (due to scheduling)  Pt will improve V-RQOL score Baseline:  Goal status: not met  2.  Pt will demo WNL voice quality 80% of the time in 10 minutes  simple conversation in 2 sessions Baseline:  Goal status: not met  3.  Pt will demo AB 80% in 10 minutes simple conversation Baseline:  Goal status: met  4.  Pt will complete PhoRTE exercises with mod independence in 2  sessions Baseline: 08/23/23 Goal status: met  5.  Pt will indicate she is comfortable reading aloud for Bible study Baseline:  Goal status: not met  6.  Pt will report (subjectively) greater vocal stamina than prior to ST Baseline:  Goal status: not met  7.  Pt will ID different voice characteristics (after SOVTE) vs rough voice correctly in three sessions Baseline:  Goal status: not  met   ASSESSMENT:  CLINICAL IMPRESSION: Patient is a 68 y.o. 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 Today she cont'd work with SLP on honing procedure with PhoRTE exercises, and VFE also completed today to further work with vocal fold strengthening/bulking. See treatment date above for today's date for further details on today's session. Pt stated she prefers to complete a follow up with referring MD in September. SLP suggested d/c until follow up with Dr. Soldatova and pt agreed, and it pt needs to return at that time she can do so. On eval date she indicated that in longer conversations she feels her voice does not have the same stamina it used to and gets very weak. Lastly, she no longer reads scripture at Bible study. .   OBJECTIVE IMPAIRMENTS: include voice disorder. These impairments are limiting patient from household responsibilities, ADLs/IADLs, and effectively communicating at home and in community. Factors affecting potential to achieve goals and functional outcome are none noted.. Patient will benefit from skilled SLP services to address above impairments and improve overall function.  REHAB POTENTIAL: Excellent   PLAN:  d/c today  PLANNED INTERVENTIONS:Voice exercises, Internal/external aids, Functional tasks, SLP instruction and feedback, Compensatory strategies, Patient/family education, and [id number] Treatment of speech (30 or 45 min)     Iona Stay, CCC-SLP 10/24/2023, 5:08 PM

## 2023-10-27 ENCOUNTER — Other Ambulatory Visit: Payer: Self-pay | Admitting: Family Medicine

## 2023-10-28 ENCOUNTER — Other Ambulatory Visit: Payer: Self-pay | Admitting: Family Medicine

## 2023-11-01 ENCOUNTER — Ambulatory Visit (INDEPENDENT_AMBULATORY_CARE_PROVIDER_SITE_OTHER)

## 2023-11-01 ENCOUNTER — Ambulatory Visit (INDEPENDENT_AMBULATORY_CARE_PROVIDER_SITE_OTHER): Admitting: Podiatry

## 2023-11-01 ENCOUNTER — Encounter: Payer: Self-pay | Admitting: Podiatry

## 2023-11-01 DIAGNOSIS — M21619 Bunion of unspecified foot: Secondary | ICD-10-CM

## 2023-11-01 DIAGNOSIS — M7752 Other enthesopathy of left foot: Secondary | ICD-10-CM

## 2023-11-01 DIAGNOSIS — M7751 Other enthesopathy of right foot: Secondary | ICD-10-CM | POA: Diagnosis not present

## 2023-11-02 DIAGNOSIS — N3941 Urge incontinence: Secondary | ICD-10-CM | POA: Diagnosis not present

## 2023-11-03 NOTE — Progress Notes (Signed)
 Subjective:   Patient ID: Robin Banks, female   DOB: 68 y.o.   MRN: [id number]   HPI Patient states that she is still having a lot of forefoot pain left and states the last injection did not help her at all and this has been going on now for at least 4 months   ROS      Objective:  Physical Exam  Neurovascular status was found to be intact negative Toula' sign noted with patient found to have inflammation and fluid around the second MPJ left mild in the third that is quite intense and it is orientation with also some swelling on the dorsum of the foot and a large structural bunion deformity left     Assessment:  Appears to be inflammatory but I cannot rule out the possibility for stress fracture or other bony condition associated with this with bunion deformity     Plan:  H&P precautionary x-ray taken due to increase in swelling and at this time I applied air fracture walker to completely immobilize the foot and allow rest to occur and discussed the possibility for surgery if symptoms were to persist and we can get it under control.  Patient to be seen back in the next 3 to 4 weeks and she can continue anti-inflammatory.  The air fracture walker was properly fitted to her lower leg and seemed to relieve symptoms  X-rays were negative for signs of fracture or arthritis associated with this with large structural bunion and probable pressure against the second MPJ

## 2023-11-16 ENCOUNTER — Encounter: Payer: Self-pay | Admitting: Podiatry

## 2023-11-21 NOTE — Telephone Encounter (Signed)
 Spoke to patient has scheduled appointment in office on 12/04/2023 she is currently at the beach and not able to come in sooner.

## 2023-12-01 DIAGNOSIS — N3941 Urge incontinence: Secondary | ICD-10-CM | POA: Diagnosis not present

## 2023-12-04 ENCOUNTER — Ambulatory Visit (INDEPENDENT_AMBULATORY_CARE_PROVIDER_SITE_OTHER)

## 2023-12-04 ENCOUNTER — Encounter: Payer: Self-pay | Admitting: Family Medicine

## 2023-12-04 ENCOUNTER — Ambulatory Visit: Admitting: Podiatry

## 2023-12-04 ENCOUNTER — Encounter: Payer: Self-pay | Admitting: Podiatry

## 2023-12-04 DIAGNOSIS — M2022 Hallux rigidus, left foot: Secondary | ICD-10-CM

## 2023-12-04 DIAGNOSIS — M7752 Other enthesopathy of left foot: Secondary | ICD-10-CM | POA: Diagnosis not present

## 2023-12-04 DIAGNOSIS — M21619 Bunion of unspecified foot: Secondary | ICD-10-CM | POA: Diagnosis not present

## 2023-12-04 MED ORDER — PREDNISONE 10 MG PO TABS: ORAL_TABLET | ORAL | 0 refills | Status: AC

## 2023-12-04 NOTE — Patient Instructions (Signed)

## 2023-12-04 NOTE — Telephone Encounter (Signed)
 New podiatry referral can be placed where, did she have a specific podiatrist she would like to be sent to?

## 2023-12-05 NOTE — Telephone Encounter (Signed)
 Referral placed per patient request to Glendia Pae, [facility].

## 2023-12-05 NOTE — Addendum Note (Signed)
 Addended by: Dawnielle Christiana A on: 12/05/2023 04:32 PM   Modules accepted: Orders

## 2023-12-06 NOTE — Progress Notes (Signed)
 Subjective:   Patient ID: Robin Banks, female   DOB: 68 y.o.   MRN: [id number]   HPI Patient presents stating that she still is having the pain in the foot today and it still painful when she walks down on it.  States that it is possible that the second toe is moving somewhat even though it is hard for her to judge but the boot is not alleviating the discomfort she experiences   ROS      Objective:  Physical Exam  Neurovascular status intact with patient with an approximate 3 to 98-month history of inflammation and pain in the forefoot left which seems to be centered around the second metatarsal phalangeal joint with the possibility of gradual lifting of the second toe with structural bunion and prominence also noted around the first metatarsal     Assessment:  Inflammatory capsulitis most likely of the second MPJ with possibility of flexor plate stretch or tear which could be part of the pathology present     Plan:  H&P x-ray reevaluated and new x-ray taken today discussed and at this point I did discuss ultimately digital fusion shortening osteotomy and bunion correction.  At this point we will get a give it 1 more month of rigid bottom shoes and oral anti-inflammatory which was written today with all instructions on usage.  Patient will be seen back in 1 month and decision will be made based on the response over this next month.  I did place patient on a 12-day steroid pack to try to reduce the inflammatory process  X-rays indicate that the second toe has been lifted from the previous x-ray with more compression against the joint surface

## 2023-12-14 DIAGNOSIS — N3941 Urge incontinence: Secondary | ICD-10-CM | POA: Diagnosis not present

## 2023-12-14 DIAGNOSIS — N3946 Mixed incontinence: Secondary | ICD-10-CM | POA: Diagnosis not present

## 2023-12-19 ENCOUNTER — Other Ambulatory Visit: Payer: Self-pay | Admitting: Family Medicine

## 2024-01-01 ENCOUNTER — Encounter: Payer: Self-pay | Admitting: Podiatry

## 2024-01-01 ENCOUNTER — Ambulatory Visit (INDEPENDENT_AMBULATORY_CARE_PROVIDER_SITE_OTHER): Admitting: Podiatry

## 2024-01-01 DIAGNOSIS — M7752 Other enthesopathy of left foot: Secondary | ICD-10-CM

## 2024-01-01 DIAGNOSIS — M21619 Bunion of unspecified foot: Secondary | ICD-10-CM | POA: Diagnosis not present

## 2024-01-03 NOTE — Progress Notes (Signed)
 Subjective:   Patient ID: Robin Banks, female   DOB: 68 y.o.   MRN: [id number]   HPI Patient presents stating she is improving and having less discomfort in her foot   ROS      Objective:  Physical Exam  Neurovascular status intact with inflammation of the left second MPJ which is improving and while still tender nowhere near to the degree it was prior     Assessment:  Improvement of capsulitis left 2nd and 3rd MPJ     Plan:  H&P reviewed at great length discussed continued oral anti-inflammatories and pattern to take them and went ahead today and advised on rigid shoe gear usage and activities that she should be careful with.  Still may require more aggressive treatment we will make that decision based on response

## 2024-01-17 DIAGNOSIS — R350 Frequency of micturition: Secondary | ICD-10-CM | POA: Diagnosis not present

## 2024-01-17 DIAGNOSIS — N3281 Overactive bladder: Secondary | ICD-10-CM | POA: Diagnosis not present

## 2024-01-17 DIAGNOSIS — R351 Nocturia: Secondary | ICD-10-CM | POA: Diagnosis not present

## 2024-03-01 DIAGNOSIS — D2271 Melanocytic nevi of right lower limb, including hip: Secondary | ICD-10-CM | POA: Diagnosis not present

## 2024-03-01 DIAGNOSIS — L821 Other seborrheic keratosis: Secondary | ICD-10-CM | POA: Diagnosis not present

## 2024-03-01 DIAGNOSIS — L814 Other melanin hyperpigmentation: Secondary | ICD-10-CM | POA: Diagnosis not present

## 2024-03-01 DIAGNOSIS — D1801 Hemangioma of skin and subcutaneous tissue: Secondary | ICD-10-CM | POA: Diagnosis not present

## 2024-03-01 DIAGNOSIS — L570 Actinic keratosis: Secondary | ICD-10-CM | POA: Diagnosis not present

## 2024-03-04 DIAGNOSIS — H524 Presbyopia: Secondary | ICD-10-CM | POA: Diagnosis not present

## 2024-03-04 DIAGNOSIS — Z0100 Encounter for examination of eyes and vision without abnormal findings: Secondary | ICD-10-CM | POA: Diagnosis not present

## 2024-03-04 DIAGNOSIS — H40013 Open angle with borderline findings, low risk, bilateral: Secondary | ICD-10-CM | POA: Diagnosis not present

## 2024-04-15 ENCOUNTER — Encounter: Payer: Self-pay | Admitting: Podiatry

## 2024-04-15 ENCOUNTER — Ambulatory Visit: Admitting: Podiatry

## 2024-04-15 DIAGNOSIS — M7752 Other enthesopathy of left foot: Secondary | ICD-10-CM | POA: Diagnosis not present

## 2024-04-15 DIAGNOSIS — M2042 Other hammer toe(s) (acquired), left foot: Secondary | ICD-10-CM | POA: Diagnosis not present

## 2024-04-15 DIAGNOSIS — M21619 Bunion of unspecified foot: Secondary | ICD-10-CM | POA: Diagnosis not present

## 2024-04-15 NOTE — Progress Notes (Signed)
 Subjective:   Patient ID: Robin Banks, female   DOB: 69 y.o.   MRN: [id number]   HPI Patient presents stating she is still get a lot of pain in her feet but it has really been bothering very the second toe has been getting increasingly sore with rigid contracture and second metatarsal joint gets sore.  Patient's failed conservative care has had previous injections anti-inflammatories shoe gear modification soaks   ROS      Objective:  Physical Exam  Neurovascular status intact with pain around the first metatarsal head left with prominence of the bone and deviation against the second toe with rigid contracture digit to left and inflammation pain of the second MPJ left foot     Assessment:  Significant foot structural issues with bunion deformity hammertoe deformity elongated metatarsal     Plan:  H&P x-ray taken all conditions reviewed.  Due to the chronicity of symptoms patient wants surgery and at this point I allowed her to read consent form going over alternative treatments complications.  Recommended distal osteotomy possible Aiken digital fusion and shortening osteotomy second.  After review with her she signed consent form understanding risk and that total recovery period can take approximately 6 months with no guarantee and I do not think I will be able to get full correction of the bunion but I do think I will be able to put it into a good position and I made her aware of that.  Patient is scheduled for outpatient surgery after all questions were answered today

## 2024-04-15 NOTE — Patient Instructions (Signed)

## 2024-04-22 ENCOUNTER — Encounter: Payer: Self-pay | Admitting: Podiatry

## 2024-04-24 ENCOUNTER — Telehealth: Payer: Self-pay | Admitting: Podiatry

## 2024-04-24 NOTE — Telephone Encounter (Signed)
 Called and scheduled patient for surgery on 05/07/2024. Patient not on any GLP1 or blood thinners. Patient pharmacy correct in chart.

## 2024-04-25 ENCOUNTER — Telehealth: Payer: Self-pay | Admitting: Podiatry

## 2024-04-25 NOTE — Telephone Encounter (Signed)
 DOS- 05/07/2024  [facility] BUNIONECTOMY LT- 71703 [facility] OSTEOTOMY LT- 28310 2ND METATARSAL OSTEOTOMY LT- 28308 2ND HAMMERTOE REPAIR LT- 71714  [employer] EFFECTIVE DATE- 03/28/2020  DEDUCTIBLE- $0 REMAINING- $0 OOP- $5900 REMAINING- $4167.42 COINSURANCE- 0%  PER AVAILITY PORTAL, PRIOR AUTH IS NOT REQUIRED FOR CPT CODES 71703, 71689, 71691, AND 71714.

## 2024-05-10 ENCOUNTER — Encounter: Admitting: Family Medicine

## 2024-05-13 ENCOUNTER — Encounter: Admitting: Podiatry

## 2024-05-27 ENCOUNTER — Encounter: Admitting: Podiatry

## 2024-07-17 ENCOUNTER — Encounter
